# Patient Record
Sex: Male | Born: 1960
Health system: Southern US, Community
[De-identification: ages and names within clinical notes are randomized; demographics above are authoritative.]

## PROBLEM LIST (undated history)

## (undated) DIAGNOSIS — E039 Hypothyroidism, unspecified: Secondary | ICD-10-CM

## (undated) DIAGNOSIS — J069 Acute upper respiratory infection, unspecified: Secondary | ICD-10-CM

## (undated) DIAGNOSIS — G4730 Sleep apnea, unspecified: Secondary | ICD-10-CM

## (undated) DIAGNOSIS — F419 Anxiety disorder, unspecified: Secondary | ICD-10-CM

## (undated) DIAGNOSIS — I10 Essential (primary) hypertension: Secondary | ICD-10-CM

## (undated) DIAGNOSIS — L309 Dermatitis, unspecified: Secondary | ICD-10-CM

## (undated) HISTORY — PX: TONSILLECTOMY: SHX5217

## (undated) HISTORY — DX: Dermatitis, unspecified: L30.9

## (undated) HISTORY — DX: Essential (primary) hypertension: I10

## (undated) HISTORY — PX: CHOLECYSTECTOMY: SHX55

## (undated) HISTORY — PX: WISDOM TOOTH EXTRACTION: SHX21

## (undated) HISTORY — PX: OSTEOCHONDROMA EXCISION: SHX2137

## (undated) HISTORY — DX: Acute upper respiratory infection, unspecified: J06.9

---

## 2000-08-08 ENCOUNTER — Encounter: Payer: Self-pay | Admitting: Internal Medicine

## 2000-08-08 ENCOUNTER — Encounter: Admission: RE | Admit: 2000-08-08 | Discharge: 2000-08-08 | Payer: Self-pay | Admitting: Internal Medicine

## 2002-07-16 ENCOUNTER — Encounter: Payer: Self-pay | Admitting: Emergency Medicine

## 2002-07-16 ENCOUNTER — Emergency Department (HOSPITAL_COMMUNITY): Admission: EM | Admit: 2002-07-16 | Discharge: 2002-07-16 | Payer: Self-pay | Admitting: Emergency Medicine

## 2002-09-26 ENCOUNTER — Emergency Department (HOSPITAL_COMMUNITY): Admission: EM | Admit: 2002-09-26 | Discharge: 2002-09-26 | Payer: Self-pay | Admitting: Emergency Medicine

## 2002-09-26 ENCOUNTER — Encounter: Payer: Self-pay | Admitting: Emergency Medicine

## 2002-09-30 ENCOUNTER — Encounter: Payer: Self-pay | Admitting: Internal Medicine

## 2002-09-30 ENCOUNTER — Inpatient Hospital Stay (HOSPITAL_COMMUNITY): Admission: AD | Admit: 2002-09-30 | Discharge: 2002-10-02 | Payer: Self-pay | Admitting: Internal Medicine

## 2002-10-01 ENCOUNTER — Encounter: Payer: Self-pay | Admitting: Cardiology

## 2002-10-02 ENCOUNTER — Encounter (INDEPENDENT_AMBULATORY_CARE_PROVIDER_SITE_OTHER): Payer: Self-pay | Admitting: *Deleted

## 2002-10-02 ENCOUNTER — Encounter: Payer: Self-pay | Admitting: Internal Medicine

## 2003-03-17 ENCOUNTER — Ambulatory Visit (HOSPITAL_COMMUNITY): Admission: RE | Admit: 2003-03-17 | Discharge: 2003-03-17 | Payer: Self-pay | Admitting: Gastroenterology

## 2003-03-17 ENCOUNTER — Encounter (INDEPENDENT_AMBULATORY_CARE_PROVIDER_SITE_OTHER): Payer: Self-pay | Admitting: *Deleted

## 2003-03-20 ENCOUNTER — Encounter: Payer: Self-pay | Admitting: Gastroenterology

## 2003-03-20 ENCOUNTER — Encounter: Admission: RE | Admit: 2003-03-20 | Discharge: 2003-03-20 | Payer: Self-pay | Admitting: Gastroenterology

## 2003-03-26 ENCOUNTER — Emergency Department (HOSPITAL_COMMUNITY): Admission: EM | Admit: 2003-03-26 | Discharge: 2003-03-26 | Payer: Self-pay | Admitting: Emergency Medicine

## 2003-04-15 ENCOUNTER — Encounter: Payer: Self-pay | Admitting: Surgery

## 2003-04-15 ENCOUNTER — Encounter (INDEPENDENT_AMBULATORY_CARE_PROVIDER_SITE_OTHER): Payer: Self-pay | Admitting: *Deleted

## 2003-04-15 ENCOUNTER — Ambulatory Visit (HOSPITAL_COMMUNITY): Admission: RE | Admit: 2003-04-15 | Discharge: 2003-04-15 | Payer: Self-pay | Admitting: Surgery

## 2004-10-28 DIAGNOSIS — D229 Melanocytic nevi, unspecified: Secondary | ICD-10-CM

## 2004-10-28 HISTORY — DX: Melanocytic nevi, unspecified: D22.9

## 2005-01-04 ENCOUNTER — Ambulatory Visit: Payer: Self-pay | Admitting: Internal Medicine

## 2005-05-19 ENCOUNTER — Ambulatory Visit: Payer: Self-pay | Admitting: Internal Medicine

## 2005-06-02 ENCOUNTER — Ambulatory Visit: Payer: Self-pay | Admitting: Internal Medicine

## 2005-10-13 ENCOUNTER — Ambulatory Visit: Payer: Self-pay | Admitting: Internal Medicine

## 2006-03-29 ENCOUNTER — Ambulatory Visit: Payer: Self-pay | Admitting: Internal Medicine

## 2006-04-25 ENCOUNTER — Ambulatory Visit: Payer: Self-pay | Admitting: Internal Medicine

## 2006-05-19 ENCOUNTER — Ambulatory Visit: Payer: Self-pay | Admitting: Internal Medicine

## 2006-08-23 ENCOUNTER — Ambulatory Visit: Payer: Self-pay | Admitting: Internal Medicine

## 2007-01-09 ENCOUNTER — Encounter (INDEPENDENT_AMBULATORY_CARE_PROVIDER_SITE_OTHER): Payer: Self-pay | Admitting: *Deleted

## 2007-01-09 ENCOUNTER — Ambulatory Visit (HOSPITAL_BASED_OUTPATIENT_CLINIC_OR_DEPARTMENT_OTHER): Admission: RE | Admit: 2007-01-09 | Discharge: 2007-01-09 | Payer: Self-pay | Admitting: General Surgery

## 2007-01-11 ENCOUNTER — Ambulatory Visit: Payer: Self-pay | Admitting: Internal Medicine

## 2007-02-15 ENCOUNTER — Encounter (INDEPENDENT_AMBULATORY_CARE_PROVIDER_SITE_OTHER): Payer: Self-pay | Admitting: Specialist

## 2007-02-15 ENCOUNTER — Ambulatory Visit (HOSPITAL_BASED_OUTPATIENT_CLINIC_OR_DEPARTMENT_OTHER): Admission: RE | Admit: 2007-02-15 | Discharge: 2007-02-15 | Payer: Self-pay | Admitting: General Surgery

## 2007-04-02 ENCOUNTER — Ambulatory Visit: Payer: Self-pay | Admitting: Internal Medicine

## 2007-06-14 ENCOUNTER — Ambulatory Visit: Payer: Self-pay | Admitting: Internal Medicine

## 2007-11-15 ENCOUNTER — Ambulatory Visit: Payer: Self-pay | Admitting: Internal Medicine

## 2008-03-28 DIAGNOSIS — J302 Other seasonal allergic rhinitis: Secondary | ICD-10-CM

## 2008-03-28 DIAGNOSIS — J4520 Mild intermittent asthma, uncomplicated: Secondary | ICD-10-CM | POA: Insufficient documentation

## 2008-03-28 DIAGNOSIS — J3089 Other allergic rhinitis: Secondary | ICD-10-CM

## 2008-03-31 ENCOUNTER — Ambulatory Visit: Payer: Self-pay | Admitting: Internal Medicine

## 2008-04-03 ENCOUNTER — Ambulatory Visit: Payer: Self-pay | Admitting: Internal Medicine

## 2008-04-05 DIAGNOSIS — K219 Gastro-esophageal reflux disease without esophagitis: Secondary | ICD-10-CM | POA: Insufficient documentation

## 2008-04-09 ENCOUNTER — Telehealth (INDEPENDENT_AMBULATORY_CARE_PROVIDER_SITE_OTHER): Payer: Self-pay | Admitting: *Deleted

## 2008-08-07 ENCOUNTER — Ambulatory Visit: Payer: Self-pay | Admitting: Internal Medicine

## 2009-01-14 ENCOUNTER — Ambulatory Visit: Payer: Self-pay | Admitting: Internal Medicine

## 2009-03-31 ENCOUNTER — Ambulatory Visit: Payer: Self-pay | Admitting: Internal Medicine

## 2009-06-17 ENCOUNTER — Ambulatory Visit: Payer: Self-pay | Admitting: Internal Medicine

## 2009-11-11 ENCOUNTER — Ambulatory Visit: Payer: Self-pay | Admitting: Internal Medicine

## 2010-04-14 ENCOUNTER — Ambulatory Visit: Payer: Self-pay | Admitting: Internal Medicine

## 2010-04-16 ENCOUNTER — Ambulatory Visit: Payer: Self-pay | Admitting: Internal Medicine

## 2010-08-11 ENCOUNTER — Ambulatory Visit: Payer: Self-pay | Admitting: Internal Medicine

## 2010-11-15 ENCOUNTER — Ambulatory Visit: Payer: Self-pay | Admitting: Internal Medicine

## 2010-12-07 ENCOUNTER — Ambulatory Visit: Payer: Self-pay | Admitting: Internal Medicine

## 2011-01-13 NOTE — Assessment & Plan Note (Signed)
Summary: Acute NP office visit - sinus infection   Primary Provider/Referring Provider:  MJ Baxley  CC:  sinus pressure/congestion, bilat ear pressure, left ear discomfort, clear to slight yellow mucus, and x3days - denies f/c/s.  History of Present Illness: 03/31/08- Ricky Sharp returns for follow-up.  He is having some Spring allergy problems, but mild , and much better than before he started his allergy shots.  He is now on allergy vaccine at 1:10 giving his own without problems.  He does have an EpiPen and has discussed risk and benefit issues with me .  He describes some sneezing, and some crusting around the eyes.  There is a  maxillary sinus pressure.  He does not want to add additional medicines if possible.  He is aware of esophageal reflux, and we discussed this.  He has not been wheezing and there has been nothing purulent.  03/31/09- Asthma, allergic rhinitis, GERD Got trhough winter without significant respiratory infection. This spring pollen allergy season isn't bothering him badly. Aware of some sneeze and watery eyes, controlled with Astepro. No problems with his allergy vaccine. We discussed his meds.  Apr 14, 2010- Asthma, allergic rhinitis, GERD This is usually a problem allergy season, but he has done better the last 2 years on vaccine. Some watery eyes and crusting. No wheeze in years. Being treated for rosacea. Continues allergy vacine giving his own without problems- needs epipen.  11/15/10--Presents for an acute office visit. Complains of sinus pressure/congestion, bilat ear pressure, left ear discomfort, clear to slight yellow mucus, x3days. Has not used any otc meds.  Denies chest pain, dyspnea, orthopnea, hemoptysis, fever, n/v/d, edema, headache.    Medications Prior to Update: 1)  Allergy Vaccine 1:10 Go .... As Directed 2)  Astepro 137 Mcg/spray  Soln (Azelastine Hcl) .Marland Kitchen.. 1-2 Sprays in Each Nostril Two Times A Day As Needed 3)  Veramyst 27.5 Mcg/spray  Susp  (Fluticasone Furoate) .Marland Kitchen.. 1 Puff in Am Daily 4)  Kapidex 60 Mg Cpdr (Dexlansoprazole) .... Take 1 By Mouth Once Daily 5)  Epipen 0.3 Mg/0.27ml (1:1000)  Devi (Epinephrine Hcl (Anaphylaxis)) .... As Needed 6)  Patanol 0.1 %  Soln (Olopatadine Hcl) .... As Needed 7)  Orencia 250 Mg Solr (Abatacept) .... As Needed Roseca Treatment  Current Medications (verified): 1)  Allergy Vaccine 1:10 Go .... Once Weekly 2)  Astepro 137 Mcg/spray  Soln (Azelastine Hcl) .Marland Kitchen.. 1-2 Sprays in Each Nostril Two Times A Day As Needed 3)  Veramyst 27.5 Mcg/spray  Susp (Fluticasone Furoate) .Marland Kitchen.. 1 Puff in Each Nostril Once Daily As Needed 4)  Dexilant 60 Mg Cpdr (Dexlansoprazole) .... Take 1 Capsule By Mouth Once A Day Before Meal As Needed 5)  Epipen 0.3 Mg/0.74ml (1:1000)  Devi (Epinephrine Hcl (Anaphylaxis)) .... As Needed 6)  Patanol 0.1 %  Soln (Olopatadine Hcl) .... As Needed 7)  Orencia 250 Mg Solr (Abatacept) .... As Needed Roseca Treatment  Allergies (verified): No Known Drug Allergies  Past History:  Past Medical History: Last updated: 04/14/2010 ASTHMA (ICD-493.90) ALLERGIC RHINITIS (ICD-477.9) G E R D  Past Surgical History: Last updated: 03/31/2009 Cholecystectomy Tonsillectomy Osteochondroma from right tibia  Family History: Last updated: 03/31/2009 Parents living 2009  Social History: Last updated: 03/31/2009 Patient never smoked.  Network Production designer, theatre/television/film Married  Risk Factors: Smoking Status: never (03/31/2009)  Review of Systems      See HPI  Vital Signs:  Patient profile:   50 year old male Height:      70.5 inches  Weight:      199.13 pounds BMI:     28.27 O2 Sat:      97 % on Room air Temp:     97.6 degrees F oral Pulse rate:   82 / minute BP sitting:   136 / 94  (left arm) Cuff size:   regular  Vitals Entered By: Ricky Master CNA/MA (November 15, 2010 4:34 PM)  O2 Flow:  Room air CC: sinus pressure/congestion, bilat ear pressure, left ear discomfort,  clear to slight yellow mucus, x3days - denies f/c/s Is Patient Diabetic? No Comments Medications reviewed with patient Daytime contact number verified with patient. Ricky Master CNA/MA  November 15, 2010 4:34 PM    Physical Exam  Additional Exam:  General: A/Ox3; pleasant and cooperative, NAD, SKIN: no rash, lesions NODES: no lymphadenopathy HEENT: Ricky Sharp, EOM- WNL, Conjuctivae- clear, PERRLA, TM-WNL, Nose- clear, Throat- clear and wnl, Mallampati  III NECK: Supple w/ fair ROM, JVD- none, normal carotid impulses w/o bruits Thyroid- CHEST: Clear to P&A HEART: RRR, no m/g/r heard ABDOMEN: TFT:DDUK, nl pulses, no edema  NEURO: Grossly intact to observation      Impression & Recommendations:  Problem # 1:  ALLERGIC RHINITIS (ICD-477.9)  URI  Plan:  Mucinex DM two times a day as needed cough/congestion.  Zyrtec 10mg  at bedtime as needed drainage.  Saline nasal rinses as needed  Restart veramyst and astepro Zpack to have on hold if symptoms worsen or do not improve with discolored mucus.  Please contact office for sooner follow up if symptoms do not improve or worsen  His updated medication list for this problem includes:    Astepro 137 Mcg/spray Soln (Azelastine hcl) .Marland Kitchen... 1-2 sprays in each nostril two times a day as needed    Veramyst 27.5 Mcg/spray Susp (Fluticasone furoate) .Marland Kitchen... 1 puff in each nostril once daily as needed  Orders: Est. Patient Level III (02542)  Medications Added to Medication List This Visit: 1)  Allergy Vaccine 1:10 Go  .... Once weekly 2)  Astepro 137 Mcg/spray Soln (Azelastine hcl) .Marland Kitchen.. 1-2 sprays in each nostril two times a day as needed 3)  Veramyst 27.5 Mcg/spray Susp (Fluticasone furoate) .Marland Kitchen.. 1 puff in each nostril once daily as needed 4)  Dexilant 60 Mg Cpdr (Dexlansoprazole) .... Take 1 capsule by mouth once a day before meal as needed 5)  Zithromax Z-pak 250 Mg Tabs (Azithromycin) .... Take as directed.  Complete Medication  List: 1)  Allergy Vaccine 1:10 Go  .... Once weekly 2)  Astepro 137 Mcg/spray Soln (Azelastine hcl) .Marland Kitchen.. 1-2 sprays in each nostril two times a day as needed 3)  Veramyst 27.5 Mcg/spray Susp (Fluticasone furoate) .Marland Kitchen.. 1 puff in each nostril once daily as needed 4)  Dexilant 60 Mg Cpdr (Dexlansoprazole) .... Take 1 capsule by mouth once a day before meal as needed 5)  Epipen 0.3 Mg/0.21ml (1:1000) Devi (Epinephrine hcl (anaphylaxis)) .... As needed 6)  Patanol 0.1 % Soln (Olopatadine hcl) .... As needed 7)  Orencia 250 Mg Solr (Abatacept) .... As needed roseca treatment 8)  Zithromax Z-pak 250 Mg Tabs (Azithromycin) .... Take as directed.  Patient Instructions: 1)  Mucinex DM two times a day as needed cough/congestion.  2)  Zyrtec 10mg  at bedtime as needed drainage.  3)  Saline nasal rinses as needed  4)  Restart veramyst and astepro 5)  Zpack to have on hold if symptoms worsen or do not improve with discolored mucus.  6)  Please contact  office for sooner follow up if symptoms do not improve or worsen  Prescriptions: ZITHROMAX Z-PAK 250 MG TABS (AZITHROMYCIN) take as directed.  #1 x 0   Entered and Authorized by:   Rubye Oaks NP   Signed by:   Tammy Parrett NP on 11/15/2010   Method used:   Print then Give to Patient   RxID:   [id number]    Immunization History:  Influenza Immunization History:    Influenza:  historical (09/11/2010)

## 2011-01-13 NOTE — Assessment & Plan Note (Signed)
Summary: 12 months/apc   Primary Provider/Referring Provider:  Delbert Harness   History of Present Illness: 03/31/08- Ricky Sharp returns for follow-up.  He is having some Spring allergy problems, but mild , and much better than before he started his allergy shots.  He is now on allergy vaccine at 1:10 giving his own without problems.  He does have an EpiPen and has discussed risk and benefit issues with me .  He describes some sneezing, and some crusting around the eyes.  There is a  maxillary sinus pressure.  He does not want to add additional medicines if possible.  He is aware of esophageal reflux, and we discussed this.  He has not been wheezing and there has been nothing purulent.  03/31/09- Asthma, allergic rhinitis, GERD Got trhough winter without significant respiratory infection. This spring pollen allergy season isn't bothering him badly. Aware of some sneeze and watery eyes, controlled with Astepro. No problems with his allergy vaccine. We discussed his meds.  Apr 14, 2010- Asthma, allergic rhinitis, GERD This is usually a problem allergy season, but he has done better the last 2 years on vaccine. Some watery eyes and crusting. No wheeze in years. Being treated for rosacea. Continues allergy vacine giving his own without problems- needs epipen.    Current Medications (verified): 1)  Allergy Vaccine 1:10 Go .... As Directed 2)  Astepro 137 Mcg/spray  Soln (Azelastine Hcl) .Marland Kitchen.. 1-2 Sprays in Each Nostril Two Times A Day As Needed 3)  Veramyst 27.5 Mcg/spray  Susp (Fluticasone Furoate) .Marland Kitchen.. 1 Puff in Am Daily 4)  Kapidex 60 Mg Cpdr (Dexlansoprazole) .... Take 1 By Mouth Once Daily 5)  Epipen 0.3 Mg/0.25ml (1:1000)  Devi (Epinephrine Hcl (Anaphylaxis)) .... As Needed 6)  Patanol 0.1 %  Soln (Olopatadine Hcl) .... As Needed 7)  Orencia 250 Mg Solr (Abatacept) .... As Needed Roseca Treatment  Allergies (verified): No Known Drug Allergies  Past History:  Past Surgical History: Last  updated: 03/31/2009 Cholecystectomy Tonsillectomy Osteochondroma from right tibia  Family History: Last updated: 03/31/2009 Parents living 2009  Social History: Last updated: 03/31/2009 Patient never smoked.  Network Production designer, theatre/television/film Married  Risk Factors: Smoking Status: never (03/31/2009)  Past Medical History: ASTHMA (ICD-493.90) ALLERGIC RHINITIS (ICD-477.9) G E R D  Review of Systems      See HPI  The patient denies anorexia, fever, weight loss, weight gain, vision loss, decreased hearing, hoarseness, chest pain, syncope, dyspnea on exertion, peripheral edema, prolonged cough, headaches, hemoptysis, and severe indigestion/heartburn.    Vital Signs:  Patient profile:   50 year old male Height:      70.5 inches Weight:      199 pounds BMI:     28.25 O2 Sat:      97 % on Room air Pulse rate:   66 / minute BP sitting:   120 / 82  (left arm) Cuff size:   regular  Vitals Entered By: Reynaldo Minium CMA (Apr 14, 2010 10:57 AM)  O2 Flow:  Room air  Physical Exam  Additional Exam:  General: A/Ox3; pleasant and cooperative, NAD, SKIN: no rash, lesions NODES: no lymphadenopathy HEENT: [place]/AT, EOM- WNL, Conjuctivae- clear, PERRLA, TM-WNL, Nose- clear, Throat- clear and wnl, Mallampati  III NECK: Supple w/ fair ROM, JVD- none, normal carotid impulses w/o bruits Thyroid- CHEST: Clear to P&A HEART: RRR, no m/g/r heard ABDOMEN: EAV:WUJW, nl pulses, no edema  NEURO: Grossly intact to observation      Impression & Recommendations:  Problem #  1:  ASTHMA (ICD-493.90) This has been inapparent in recent years.  Problem # 2:  ALLERGIC RHINITIS (ICD-477.9)  med refills. Will continue allergy vaccine and renew the epipen. His updated medication list for this problem includes:    Astepro 137 Mcg/spray Soln (Azelastine hcl) .Marland Kitchen... 1-2 sprays in each nostril two times a day as needed    Veramyst 27.5 Mcg/spray Susp (Fluticasone furoate) .Marland Kitchen... 1 puff in am daily  Medications  Added to Medication List This Visit: 1)  Orencia 250 Mg Solr (Abatacept) .... As needed roseca treatment  Other Orders: Est. Patient Level III (16109)  Patient Instructions: 1)  Please schedule a follow-up appointment in 1 year. 2)  Script refills printed 3)  Continue  allergy vaccine Prescriptions: PATANOL 0.1 %  SOLN (OLOPATADINE HCL) as needed  #1 x prn   Entered and Authorized by:   Waymon Budge MD   Signed by:   Waymon Budge MD on 04/14/2010   Method used:   Print then Give to Patient   RxID:   [id number] EPIPEN 0.3 MG/0.3ML (1:1000)  DEVI (EPINEPHRINE HCL (ANAPHYLAXIS)) as needed  #1 x prn   Entered and Authorized by:   Waymon Budge MD   Signed by:   Waymon Budge MD on 04/14/2010   Method used:   Print then Give to Patient   RxID:   [id number] VERAMYST 27.5 MCG/SPRAY  SUSP (FLUTICASONE FUROATE) 1 puff in am daily  #1 x prn   Entered and Authorized by:   Waymon Budge MD   Signed by:   Waymon Budge MD on 04/14/2010   Method used:   Print then Give to Patient   RxID:   [id number] ASTEPRO 137 MCG/SPRAY  SOLN (AZELASTINE HCL) 1-2 sprays in each nostril two times a day as needed  #1 x prn   Entered and Authorized by:   Waymon Budge MD   Signed by:   Waymon Budge MD on 04/14/2010   Method used:   Print then Give to Patient   RxID:   [id number]

## 2011-01-20 ENCOUNTER — Other Ambulatory Visit: Payer: Self-pay | Admitting: Internal Medicine

## 2011-01-24 ENCOUNTER — Encounter (INDEPENDENT_AMBULATORY_CARE_PROVIDER_SITE_OTHER): Payer: PRIVATE HEALTH INSURANCE | Admitting: Internal Medicine

## 2011-01-24 DIAGNOSIS — Z23 Encounter for immunization: Secondary | ICD-10-CM

## 2011-01-24 DIAGNOSIS — Z0000 Encounter for general adult medical examination without abnormal findings: Secondary | ICD-10-CM

## 2011-04-12 ENCOUNTER — Encounter: Payer: Self-pay | Admitting: Internal Medicine

## 2011-04-14 ENCOUNTER — Encounter: Payer: Self-pay | Admitting: Internal Medicine

## 2011-04-14 ENCOUNTER — Ambulatory Visit (INDEPENDENT_AMBULATORY_CARE_PROVIDER_SITE_OTHER): Payer: PRIVATE HEALTH INSURANCE | Admitting: Internal Medicine

## 2011-04-14 VITALS — BP 138/86 | HR 73 | Ht 70.5 in | Wt 199.0 lb

## 2011-04-14 DIAGNOSIS — J45909 Unspecified asthma, uncomplicated: Secondary | ICD-10-CM

## 2011-04-14 DIAGNOSIS — J309 Allergic rhinitis, unspecified: Secondary | ICD-10-CM

## 2011-04-14 MED ORDER — EPINEPHRINE 0.15 MG/0.3ML IJ DEVI: 0.1500 mg | INTRAMUSCULAR | Status: DC | PRN

## 2011-04-14 MED ORDER — OLOPATADINE HCL 0.1 % OP SOLN: 1.0000 [drp] | Freq: Two times a day (BID) | OPHTHALMIC | Status: DC

## 2011-04-14 MED ORDER — AZELASTINE HCL 0.1 % NA SOLN: 2.0000 | Freq: Two times a day (BID) | NASAL | Status: DC

## 2011-04-14 MED ORDER — FLUTICASONE FUROATE 27.5 MCG/SPRAY NA SUSP: 2.0000 | Freq: Every day | NASAL | Status: DC

## 2011-04-14 NOTE — Patient Instructions (Signed)
We are continuing present treatment.   Med scripts updated  You might look at otc nonsteroidal anti-inflammatory medicine Nasalcrom/ cromolyn. Directions are on box.

## 2011-04-14 NOTE — Assessment & Plan Note (Signed)
We will continue allergy vaccine and antihistamines, avoiding steroids as he requests. He could try otc cromolyn.

## 2011-04-14 NOTE — Progress Notes (Signed)
  Subjective:    Patient ID: Ricky Sharp, male    DOB: 1961/06/30, 50 y.o.   MRN: [id number]  HPI 04/14/11-50 yoM never smoker followecd for asthma and allergic rhinitis, complicated by GERD.  Last here 11/25/10 for acute visit. Reports no particular problems since then except this year Spring pollen season has been worse. Eyes burn and water, nasal congestion, choking sensation. Denies chest tightness or wheeze. Mucus is yellow green with no fever or obvious infection. Has used saline rinse and antihistamines every few days. . Not using the nasal steroid or Astepro. Says with hx of reflux, he feels better if he minimizes meds, especially inhaled steroids.  Continues allergy vaccine at 1:10 GO.    Review of Systems  see HPI Constitutional:   No weight loss, night sweats,  Fevers, chills, fatigue, lassitude. HEENT:   No headaches,  Difficulty swallowing,  Tooth/dental problems,  Sore throat,              CV:  No chest pain,  Orthopnea, PND, swelling in lower extremities, anasarca, dizziness, palpitations  GI  No heartburn, indigestion, abdominal pain, nausea, vomiting, diarrhea, change in bowel habits, loss of appetite  Resp: No shortness of breath with exertion or at rest.  No excess mucus, no productive cough,  No non-productive cough,  No coughing up of blood. .  No wheezing.    Skin: no rash or lesions.  GU: no dysuria, change in color of urine, no urgency or frequency.  No flank pain.  MS:  No joint pain or swelling.  No decreased range of motion.  No back pain.  Psych:  No change in mood or affect. No depression or anxiety.  No memory loss.   Objective:   Physical Exam General- Alert, Oriented, Affect-appropriate, Distress- none acute  Skin- rash-none, lesions- none, excoriation- none  Lymphadenopathy- none  Head- atraumatic  Eyes- Gross vision intact, PERRLA, conjunctivae clear secretions  Ears- Normal- Hearing, canals, Tm  Nose- Clear,  No- Septal dev, mucus,  polyps, erosion, perforation   Throat- Mallampati II , mucosa clear , drainage- none, tonsils- atrophic  Neck- flexible , trachea midline, no stridor , thyroid nl, carotid no bruit  Chest - symmetrical excursion , unlabored     Heart/CV- RRR , no murmur , no gallop  , no rub, nl s1 s2                     - JVD- none , edema- none, stasis changes- none, varices- none     Lung- clear to P&A, wheeze- none, cough- none , dullness-none, rub- none     Chest wall-  Abd- tender-no, distended-no, bowel sounds-present, HSM- no  Br/ Gen/ Rectal- Not done, not indicated  Extrem- cyanosis- none, clubbing, none, atrophy- none, strength- nl  Neuro- grossly intact to observation         Assessment & Plan:

## 2011-04-24 ENCOUNTER — Encounter: Payer: Self-pay | Admitting: Internal Medicine

## 2011-04-24 NOTE — Assessment & Plan Note (Signed)
Adequate control. 

## 2011-04-29 NOTE — Op Note (Signed)
Ricky Sharp, Ricky Sharp                        ACCOUNT NO.:  [id number]   MEDICAL RECORD NO.:  [id number]                   PATIENT TYPE:  INP   LOCATION:  4711                                 FACILITY:  [facility]   PHYSICIAN:  Anselmo Rod, M.D.               DATE OF BIRTH:  December 03, 1961   DATE OF PROCEDURE:  10/02/2002  DATE OF DISCHARGE:  10/02/2002                                 OPERATIVE REPORT   PROCEDURE PERFORMED:  Esophagogastroduodenoscopy with biopsy.   ENDOSCOPIST:  Charna Elizabeth, M.D.   INSTRUMENT USED:  Olympus video panendoscope.   INDICATIONS FOR PROCEDURE:  The patient is a 50 year old white male with a  history of chest pain and epigastric pain.  MI has been ruled out by  enzymes.  Rule out ulcer disease.   PREPROCEDURE PREPARATION:  Informed consent was procured from the patient.  The patient was fasted for eight hours prior to the procedure.   PREPROCEDURE PHYSICAL:  The patient had stable vital signs.  Neck supple,  chest clear to auscultation.  S1, S2 regular.  Abdomen soft with normal  bowel sounds.   DESCRIPTION OF PROCEDURE:  The patient was placed in the left lateral  decubitus position and sedated with 50 mg of Demerol and 5 mg of Versed  intravenously.  Once the patient was adequately sedated and maintained on  low-flow oxygen and continuous cardiac monitoring, the Olympus video  panendoscope was advanced through the mouth piece over the tongue into the  esophagus under direct vision.  The entire esophagus appeared normal with no  evidence of ring, stricture, masses, esophagitis or Barrett's mucosa.  The  scope was then advanced to the stomach.  There was severe gastritis  throughout the gastric mucosa, multiple small punched out ulcers seen in the  stomach.  There was old heme overlying the small ulcers.  It was predominant  in the proximal portion of the stomach and in the midbody.  Multiple  erosions were seen in the antrum.  There was moderate  duodenitis in the  duodenal bulb.  The small bowel distal to the bulb appeared normal.  Multiple biopsies were done to rule out presence of Helicobacter pylori by  pathology.   IMPRESSION:  1. Normal-appearing esophagus.  2. Multiple small gastric ulcers with old heme overlying them.  3. Severe gastritis with multiple erosions.  4. Moderate duodenitis in duodenal bulb.  5. Normal-appearing proximal small bowel and __________ up to 60 cm.   RECOMMENDATIONS:  1. Await pathology results.  2. Treat with high dose proton pump inhibitor as discussed with Dr. Eden Emms Baxley.  3.     Treat with antibiotics if Helicobacter pylori present on pathology.  4. Avoid all nonsteroidals for now.  5. Outpatient follow-up in the next seven to 10 days or earlier if need be.  Anselmo Rod, M.D.    JNM/MEDQ  D:  10/03/2002  T:  10/03/2002  Job:  [id number]   cc:   Luanna Cole. Lenord Fellers, MD  [address], [place]  [place]  [place] [address]  Fax: [phone]   Learta Codding, M.D. [facility]

## 2011-04-29 NOTE — Op Note (Signed)
NAMEBOB, DAVERSA NO.:  [id number]   MEDICAL RECORD NO.:  [id number]          PATIENT TYPE:  AMB   LOCATION:  DSC                          FACILITY:  [facility]   PHYSICIAN:  Cherylynn Ridges, M.D.    DATE OF BIRTH:  1960/12/27   DATE OF PROCEDURE:  01/09/2007  DATE OF DISCHARGE:                               OPERATIVE REPORT   PREOPERATIVE DIAGNOSIS:  Right upper back or posterior axillary lipoma.   POSTOPERATIVE DIAGNOSIS:  Right upper back or posterior axillary lipoma.   PROCEDURE:  Excision of right upper back posterior lipoma.   SURGEON:  Cherylynn Ridges, M.D.   ASSISTANT:  None.   ANESTHESIA:  Monitored anesthesia care with Xylocaine and Marcaine  local.   ESTIMATED BLOOD LOSS:  Less than 10 mL.   COMPLICATIONS:  None.   CONDITION:  Stable.   INDICATIONS FOR OPERATION:  The patient is a 50 year old with a  symptomatic bulging mass on the right posterior upper back, posterior  axillary and post scapula area, who now comes in for excision.   FINDINGS:  The patient had a 6 x 2 x 4 cm lipoma superficial to the  muscle fascia, which was excised.   OPERATION:  The patient was taken to the operating room and placed on  the table in the supine position.  After an adequate amount of IV  sedation, he was placed in the left lateral decubitus position with his  right shoulder up.   He was prepped and draped in the usual sterile manner.  We anesthetized  the area transversely using a 25-gauge needle and 1% Xylocaine with  bicarbonate.  Approximately a total of 20 mL were used.  We made an  incision using a #15 blade about 6 cm long and then dissected down  through the subcutaneous tissue into the subcu while we immediately ran  into smooth shell lipoma.  We dissected it out superiorly, inferiorly,  and medially and laterally, and then completely excised it using  electrocautery.  However, there appeared to still be a bulge in the  muscle after we excised it.   Therefore, we entered the muscle fascia and  explored thoroughly down to the chest wall for an additional possible  submuscular or subfascial lipoma, which was not found.  The bulging was  possibly secondary just to positioning of the patient on the table in  the left lateral decubitus position.  However, after extensive  exploration, no further lipomas or other masses were noted.  Again, we  went all the way down to the chest wall with no masses being seen,  splitting the fascia, but not cutting the muscle itself.   After we thoroughly explored, we irrigated with saline, obtained  hemostasis with electrocautery, and then we closed in 2 layers with 3-0  Vicryl subcutaneous layer and then a subcuticular stitch of 4-0  Monocryl.  Marcaine 0.25% with epinephrine was injected into the wound  prior to closure, a total of 10 mL were used.  A sterile dressing was  applied.  All needle counts, sponge counts, and instrument counts were  correct.      Cherylynn Ridges, M.D.  Electronically Signed     JOW/MEDQ  D:  01/09/2007  T:  01/09/2007  Job:  [id number]   cc:   Anselmo Rod, M.D.

## 2011-04-29 NOTE — Consult Note (Signed)
NAME:  Ricky Sharp, Ricky Sharp                        ACCOUNT NO.:  [id number]   MEDICAL RECORD NO.:  [id number]                   PATIENT TYPE:  EMS   LOCATION:  MAJO                                 FACILITY:  [facility]   PHYSICIAN:  Learta Codding, M.D. [facility]             DATE OF BIRTH:  01-16-1961   DATE OF CONSULTATION:  DATE OF DISCHARGE:  09/26/2002                                   CONSULTATION   CURRENT COMPLAINTS:  Epigastric pain and chest tightness.   HISTORY OF PRESENT ILLNESS:  The patient is a 50 year old white male with no  prior cardiac history. The patient was recently seen in the emergency room  on 09/26/2002 with complaints of chest tightness. Reportedly the patient's  enzymes were within normal limits. The patient's pain apparently occurred on  10/15 in the late evening and then early on in the morning of 10/16. 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 Dr. Cornelius Moras referred the patient to  911 and the emergency room. Subsequently the patient was discharged. He did  receive aspirin and nitroglycerin in the ambulance without any improvement  to the symptoms. At that time he also received a GI cocktail, which then  caused the pain to sit in the upper abdomen. On Friday he felt reasonably  good as stated, but did have some burning in the upper epigastrium  associated with burping. On Saturday he had a brief spell of upper  epigastric burning. On Sunday he felt 90%+ improved, but he still had some  gastric bloating. He denied any exertional chest pain or exertional  shortness of breath over the weekend. He was followed up by Dr. Lenord Fellers today  in the office and did report ongoing symptoms earlier today of upper  epigastric pain. This was not associated with nausea or vomiting.  Electrocardiogram in Dr. Beryle Quant office does not show evidence of ischemia.  The patient had been tried to take Prilosec over-the-counter. He had been  under significant stress at work lately as well as  at home. The patient  reports feeling fatigued and weak, but denies any palpitations or syncope.  On admission today the patient's electrocardiogram does not demonstrate  evidence of ischemia.   PAST MEDICAL HISTORY:  History of kidney stone in August. History of fatty  liver with elevated liver functions test. On low fat, low calorie diet.  History of osteochondroma of the tibia.   ALLERGIES:  No known drug allergies.   MEDICATIONS:  Advair and Nasacort.   SOCIAL HISTORY:  The patient is a Psychiatric nurse. He has three children.  Denies tobacco or alcohol history.   FAMILY HISTORY:  Noncontributory with no significant cardiac disease. The  patient does not know his biological father.   REVIEW OF SYMPTOMS:  Epigastric discomfort as outlined above. Frequent  burping and questionable borborygmi. Increased flatulence. No nausea or  vomiting. No fever or chills. No dysuria  or frequency.   PHYSICAL EXAMINATION:   VITAL SIGNS:  Blood pressure 130/85, heart rate 72 beats per minute,  temperature is afebrile at 98.   GENERAL:  Well-nourished white male in no apparent distress.   HEENT:  Conjunctivae clear.   NECK:  Supple. No carotid upstroke. No carotid bruit.   LUNGS:  Clear.   HEART:  Regular rate and rhythm. Normal S1, S2. No murmur, rub or gallop.   ABDOMEN:  Soft. Nontender. No rebound or guarding. Good bowel sounds. There  is mild tenderness in the left upper quadrant.   EXTREMITIES:  2+ peripheral pulses. There is no cyanosis, clubbing, or  edema.   NEUROLOGIC:  The patient is alert, oriented, and grossly nonfocal.   LABORATORY DATA:  EKG normal sinus rhythm, no acute ischemic change  otherwise. Unremarkable EKG. Chest x-ray within normal limits. Labs are  pending including a troponin, CMP, BMP.   IMPRESSION/ PLAN:  1. Atypical chest pain. The patient has few risk factors for a coronary     artery disease. His presentation is rather atypical for unstable angina.      I sincerely doubt his problem is cardiac at this point in time. However,     given the fact that this is the patient's second admission now within a     week we will proceed with an inpatient Cardiolite stress test if his     enzymes are within normal limits. Would obtain also a fasting lipid panel     for further risk assessment. If cardiac enzymes are positive certainly     cardiac catheterization could be entertained, but I doubt that this will     be the case.  2. Epigastric pain. The patient complains of significant GI complaints     including flatulence and frequent burping. He is under significant     stress. Peptic ulcer disease or other GI pathology is likely the cause of     the patient's symptoms.  3. Stress and anxiety.   DISPOSITION:  The patient will be admitted and ruled for MI. Will undergo an  exercise Cardiolite study in the morning.                                               Learta Codding, M.D. [facility]    GED/MEDQ  D:  09/30/2002  T:  09/30/2002  Job:  [id number]   cc:   Luanna Cole. Lenord Fellers, MD  [address] [place]  [place]  [place] [address]  Fax: [phone]

## 2011-04-29 NOTE — Op Note (Signed)
NAMEJAIRE, PINKHAM NO.:  [id number]   MEDICAL RECORD NO.:  [id number]          PATIENT TYPE:  AMB   LOCATION:  DSC                          FACILITY:  [facility]   PHYSICIAN:  Cherylynn Ridges, M.D.    DATE OF BIRTH:  16-Nov-1961   DATE OF PROCEDURE:  02/15/2007  DATE OF DISCHARGE:                               OPERATIVE REPORT   PREOPERATIVE DIAGNOSIS:  Lipoma of the right upper back.   POSTOPERATIVE DIAGNOSIS:  Lipoma of the right upper back.   PROCEDURE:  Excision of right upper back lipoma.   SURGEON:  Cherylynn Ridges, M.D.   ANESTHESIA:  General endotracheal, was done in the prone position.   COMPLICATIONS:  None.   CONDITION:  Stable.   INDICATION FOR OPERATION:  The patient is a 50 year old who had a prior  lipoma of the right upper back excised, now comes back in for second  lipoma to be removed from the back just below the area of the previous  excision.   FINDINGS:  The patient had a well circumscribed lipoma in the  suprafascial area of the right upper back measuring approximately 4 x  3.5 cm in size.   OPERATION:  The patient was taken to the operating room, placed  initially on the table in the supine position.  After adequate  endotracheal anesthetic was administered, he was flipped and placed on  the operative table in the prone position.   The right upper back was prepped and draped in the usual sterile manner.  We marked the area of the lipoma preoperatively and then  intraoperatively.  We made a transverse sort of a diagonal incision  across the mass and dissected down into the subcutaneous tissue using  electrocautery and also Metzenbaum scissors.  The actual lipoma was well  circumscribed.  We were able to dissect down easily from the surrounding  structures, obtaining hemostasis with electrocautery.  We excised  through the mass completely and placed it off the field to be sent to  pathology.  We irrigated with saline solution and  obtained hemostasis  using electrocautery.  The wound was then closed in 2 layers,  a 4-0 Vicryl in the subcutaneous layer and a running subcuticular stitch  of 4-0 Monocryl.  The 0.5% Marcaine without epinephrine was injected  into the wound, approximately 8 mL were used.  A sterile dressing was  applied.  All counts were correct.      Cherylynn Ridges, M.D.  Electronically Signed     JOW/MEDQ  D:  02/15/2007  T:  02/15/2007  Job:  [id number]   cc:   Luanna Cole. Lenord Fellers, M.D.

## 2011-04-29 NOTE — Op Note (Signed)
NAME:  Ricky Sharp, Ricky Sharp                        ACCOUNT NO.:  [id number]   MEDICAL RECORD NO.:  [id number]                   PATIENT TYPE:  AMB   LOCATION:  ENDO                                 FACILITY:  [facility]   PHYSICIAN:  Anselmo Rod, M.D.               DATE OF BIRTH:  [birth year]   DATE OF PROCEDURE:  03/17/2003  DATE OF DISCHARGE:                                 OPERATIVE REPORT   PROCEDURE:  Esophagogastroduodenoscopy with biopsies.   ENDOSCOPIST:  Anselmo Rod, M.D.   INSTRUMENT USED:  Olympus video panendoscope.   INDICATION FOR PROCEDURE:  A 50 year old white undergoing a repeat EGD for  epigastric pain.  The patient has had a history of H. pylori infection in  the past.  Rule out recurrent ulcer disease.   PREPROCEDURE PREPARATION:  Informed consent was procured from the patient.  The patient was fasted for eight hours prior to the procedure.   PREPROCEDURE PHYSICAL:  VITAL SIGNS:  The patient had stable vital signs.  NECK:  Supple.  CHEST:  Clear to auscultation.  S1, S2 regular.  ABDOMEN:  Soft with normal bowel sounds.   DESCRIPTION OF PROCEDURE:  The patient was placed in the left lateral  decubitus position and sedated with 70 mg of Demerol and 7 mg of Versed  intravenously.  Once the patient was adequately sedate and maintained on low-  flow oxygen and continuous cardiac monitoring, the Olympus video  panendoscope was advanced through the mouthpiece, over the tongue, into the  esophagus under direct vision.  The entire esophagus appeared normal with no  evidence of ring, stricture, masses, esophagitis, or Barrett's mucosa.  The  scope was then advanced into the stomach.  The gastric mucosa seemed to be  significantly improved compared to the last endoscopy the patient had when  he was found to have multiple ulcers in the stomach.  There was mild diffuse  gastritis.  Biopsies were done from the antrum to rule out the presence of  H. pylori by pathology,  but no ulcers, erosions, masses, or polyps were  seen.  Retroflexion in the high cardia revealed no abnormalities.  The  duodenal bulb and the small bowel distal to the bulb up to 60 cm appeared  normal.  There was no outlet obstruction.  The patient tolerated the  procedure well without complications.   IMPRESSION:  1. Normal-appearing esophagus and proximal small bowel.  2. Mild diffuse gastritis, biopsies done for Helicobacter pylori.   RECOMMENDATIONS:  1. Await pathology results.  2. Continue PPIs for now.  3. Treat with antibiotics if H. pylori present.  4.     A high-fiber diet with liberal fluid intake.  5. Add Beano to the diet as needed.  6. Outpatient follow-up in the next two weeks for further recommendations.  Anselmo Rod, M.D.    JNM/MEDQ  D:  03/18/2003  T:  03/18/2003  Job:  [id number]   cc:   Luanna Cole. Lenord Fellers, M.D.  [address] [place]  [place]  [place] [address]  Fax: [phone]

## 2011-04-29 NOTE — Assessment & Plan Note (Signed)
[facility]                             PULMONARY OFFICE NOTE   ANDER, WAMSER                     MRN:          [id number]  DATE:04/02/2007                            DOB:          Dec 03, 1961    PULMONARY FOLLOW-UP:   PROBLEM LIST:  1. Asthma.  2. Allergic rhinitis.  3. Allergic conjunctivitis.   HISTORY:  One-year follow-up.  He feels he has done quite well.  He  continues allergy vaccine at 1:10 giving his own injection with no  problems.  We reviewed risk and safety issues and refilled his EpiPen.  He has not needed Foradil or any acute bronchodilator.  He says whenever  he uses his peak flow meter, he blows off the top.   MEDICATIONS:  Allergy vaccine, Astelin, fluticasone nasal spray,  Zegerid, p.r.n. use of EpiPen, and Patanol.   No medication allergy.   OBJECTIVE:  Weight 205 pounds, BP 126/84, pulse 77, room air saturation  98%.  Eyes, nose, throat and chest were all clear at this time.  Heart  sounds regular and normal.   IMPRESSION:  Good control of asthma, allergic rhinitis and allergic  conjunctivitis.   PLAN:  1. Continue vaccine at 1:10.  2. EpiPen refilled.  We also refilled Patanol, fluticasone and Astelin      for use as discussed.  He does not feel he needs Foradil, so we are      going to watch off of bronchodilators completely after some      discussion.  3. Schedule return one year, earlier p.r.n.     Clinton D. Maple Hudson, MD, Tonny Bollman, FACP  Electronically Signed    CDY/MedQ  DD: 04/02/2007  DT: 04/03/2007  Job #: [id number]   cc:   Luanna Cole. Lenord Fellers, M.D.

## 2011-04-29 NOTE — Op Note (Signed)
NAMEJARQUIS, Ricky Sharp                        ACCOUNT NO.:  [id number]   MEDICAL RECORD NO.:  [id number]                   PATIENT TYPE:  OIB   LOCATION:  2550                                 FACILITY:  [facility]   PHYSICIAN:  Abigail Miyamoto, M.D.              DATE OF BIRTH:  Apr 06, 1961   DATE OF PROCEDURE:  04/15/2003  DATE OF DISCHARGE:                                 OPERATIVE REPORT   PREOPERATIVE DIAGNOSIS:  Biliary dyskinesia.   POSTOPERATIVE DIAGNOSIS:  Biliary dyskinesia.   PROCEDURE:  Laparoscopic cholecystectomy with intraoperative cholangiogram.   SURGEON:  Abigail Miyamoto, M.D.   ASSISTANT:  Magnus Ivan, R.N.F.A.   ANESTHESIA:  General endotracheal anesthesia.   ESTIMATED BLOOD LOSS:  Minimal.   FINDINGS:  The patient was found to have a normal cholangiogram.   DESCRIPTION OF PROCEDURE:  The patient was brought to the operating room and  identified positively.  He was placed supine on the operating room table and  general anesthesia was induced.  His abdomen was then prepped and draped in  the usual sterile fashion.  Using a #15 blade, a small transverse incision  was made below the umbilicus.  The incision was carried down to the fascia,  which was then opened with a scalpel.  A hemostat was then used to pass into  the peritoneal cavity.  Next a 0 Vicryl pursestring suture was placed around  the fascial opening.  The Hasson port was placed through the opening and  insufflation of the abdomen was begun.  Next a 12 mm port was placed in the  patient's epigastrium and  two 5 mm ports were placed in the patient's right  flank under direct vision.  The gallbladder was identified and retracted  above the liver bed.  Dissection was then carried out at the base of the  gallbladder.  The cystic duct was then dissected out.  It  was clipped once  distally and partly opened with the scissors.  A cholangiocatheter was then  inserted through an angiocatheter in the right  upper quadrant under direct  vision.  The cholangiocatheter was then placed into the cystic duct.  Prior  to proceeding with the cholangiogram, the cystic artery was identified and  clipped proximally and distally and transected.  The cholangiogram was then  performed under direct fluoroscopy with contrast.  Contrast was seen to flow  easily into the entire biliary system, common bile duct, and duodenum  without evidence of abnormalities.  The cholangiocatheter was then removed.  The cystic duct was then clipped three times proximally and completely  transected.  The gallbladder was then slowly dissected free from the liver  bed with the electrocautery.  Once the gallbladder was retrieved from the  liver bed, it was placed in an Endosac and removed through the incision at  the umbilicus.  The liver bed was irrigated again and hemostasis was  achieved.  The 0  Vicryl at the umbilicus was then tied in place, closing the  fascial defect.  The abdomen was then copiously irrigated with normal  saline.  All ports were then removed under direct vision and the abdomen was  deflated.  All incisions were then anesthetized with 0.25% Marcaine and  closed with 4-0 Vicryl  subcuticular sutures.  Steri-Strips, gauze, and tape were applied.  The  patient tolerated the procedure well.  All sponge, needle, and instrument  counts were correct at the end of the procedure.  The patient was then  extubated in the operating room and taken in stable condition to the  recovery room.                                               Abigail Miyamoto, M.D.    DB/MEDQ  D:  04/15/2003  T:  04/15/2003  Job:  [id number]

## 2011-05-03 ENCOUNTER — Ambulatory Visit (INDEPENDENT_AMBULATORY_CARE_PROVIDER_SITE_OTHER): Payer: PRIVATE HEALTH INSURANCE

## 2011-05-03 DIAGNOSIS — J309 Allergic rhinitis, unspecified: Secondary | ICD-10-CM

## 2011-09-28 ENCOUNTER — Ambulatory Visit (INDEPENDENT_AMBULATORY_CARE_PROVIDER_SITE_OTHER): Payer: PRIVATE HEALTH INSURANCE

## 2011-09-28 DIAGNOSIS — J309 Allergic rhinitis, unspecified: Secondary | ICD-10-CM

## 2011-11-07 ENCOUNTER — Encounter: Payer: Self-pay | Admitting: Internal Medicine

## 2011-11-30 ENCOUNTER — Telehealth: Payer: Self-pay | Admitting: Internal Medicine

## 2011-11-30 NOTE — Telephone Encounter (Signed)
Called spoke with patient who c/o dry, high-pitched cough that occasionally produces small amounts clear mucus, sore throat and some wheezing x1week, worse x2days.  Pt requesting appt.  CDY with no openings.  appt scheduled with TP 12.20.12 @ 1630.  Pt okay with this time.

## 2011-12-01 ENCOUNTER — Ambulatory Visit (INDEPENDENT_AMBULATORY_CARE_PROVIDER_SITE_OTHER): Payer: PRIVATE HEALTH INSURANCE | Admitting: Internal Medicine

## 2011-12-01 ENCOUNTER — Encounter: Payer: Self-pay | Admitting: Internal Medicine

## 2011-12-01 ENCOUNTER — Ambulatory Visit: Payer: PRIVATE HEALTH INSURANCE | Admitting: Adult Health

## 2011-12-01 VITALS — BP 142/100 | HR 80 | Temp 99.1°F | Ht 71.0 in | Wt 203.0 lb

## 2011-12-01 DIAGNOSIS — Z8719 Personal history of other diseases of the digestive system: Secondary | ICD-10-CM

## 2011-12-01 DIAGNOSIS — J309 Allergic rhinitis, unspecified: Secondary | ICD-10-CM

## 2011-12-01 DIAGNOSIS — J40 Bronchitis, not specified as acute or chronic: Secondary | ICD-10-CM

## 2011-12-01 DIAGNOSIS — J069 Acute upper respiratory infection, unspecified: Secondary | ICD-10-CM

## 2011-12-01 MED ORDER — CEFTRIAXONE SODIUM 1 G IJ SOLR: 1.0000 g | Freq: Once | INTRAMUSCULAR | Status: DC

## 2011-12-01 MED ORDER — CEFTRIAXONE SODIUM 1 G IJ SOLR
1.0000 g | Freq: Once | INTRAMUSCULAR | Status: AC
  Administered 2011-12-01: 1 g via INTRAMUSCULAR

## 2011-12-01 MED ORDER — CEFTRIAXONE SODIUM 1 G IJ SOLR: 1.0000 g | INTRAMUSCULAR | Status: DC

## 2011-12-10 NOTE — Patient Instructions (Signed)
Take antibiotic as prescribed. Call if not better in one week or sooner if worse.

## 2011-12-10 NOTE — Progress Notes (Signed)
  Subjective:    Patient ID: Ricky Sharp, male    DOB: September 27, 1961, 50 y.o.   MRN: [id number]  HPI 50 year old white male in today with respiratory infection. Has been sick for over a week maybe 2 weeks. Planning to go out of town for the Christmas holidays. His wife has recently had pneumonia. Daughters have been sick as well. He has had a fever and cough with discolored sputum. He's worried about pneumonia. Has a history of allergic rhinitis and GE reflux.    Review of Systems     Objective:   Physical Exam HEENT exam: TMs are clear; pharynx very slightly injected without exudate; neck supple without adenopathy; chest clear to auscultation without rales or wheezing        Assessment & Plan:  Bronchitis  History of allergic rhinitis  History of GE reflux  Plan: Rocephin 1 g IM given in office today; Levaquin 500 milligrams daily for 10 days; Hycodan 8 ounces 1 teaspoon by mouth every 6 hours when necessary cough.

## 2012-01-03 ENCOUNTER — Ambulatory Visit (INDEPENDENT_AMBULATORY_CARE_PROVIDER_SITE_OTHER): Payer: PRIVATE HEALTH INSURANCE | Admitting: Internal Medicine

## 2012-01-03 ENCOUNTER — Encounter: Payer: Self-pay | Admitting: Internal Medicine

## 2012-01-03 DIAGNOSIS — F419 Anxiety disorder, unspecified: Secondary | ICD-10-CM

## 2012-01-03 DIAGNOSIS — I10 Essential (primary) hypertension: Secondary | ICD-10-CM

## 2012-01-03 DIAGNOSIS — F411 Generalized anxiety disorder: Secondary | ICD-10-CM

## 2012-01-10 ENCOUNTER — Encounter: Payer: Self-pay | Admitting: Internal Medicine

## 2012-01-10 ENCOUNTER — Ambulatory Visit (INDEPENDENT_AMBULATORY_CARE_PROVIDER_SITE_OTHER): Payer: PRIVATE HEALTH INSURANCE | Admitting: Internal Medicine

## 2012-01-10 VITALS — BP 114/80 | HR 88 | Temp 97.8°F | Wt 202.0 lb

## 2012-01-10 DIAGNOSIS — F329 Major depressive disorder, single episode, unspecified: Secondary | ICD-10-CM

## 2012-01-10 DIAGNOSIS — I10 Essential (primary) hypertension: Secondary | ICD-10-CM | POA: Insufficient documentation

## 2012-01-10 DIAGNOSIS — F32A Depression, unspecified: Secondary | ICD-10-CM

## 2012-01-10 DIAGNOSIS — F419 Anxiety disorder, unspecified: Secondary | ICD-10-CM

## 2012-01-10 DIAGNOSIS — F3289 Other specified depressive episodes: Secondary | ICD-10-CM

## 2012-01-10 DIAGNOSIS — F341 Dysthymic disorder: Secondary | ICD-10-CM

## 2012-01-10 NOTE — Patient Instructions (Signed)
Begin Cozaar 50 mg every morning for blood pressure. Take Klonopin twice daily as needed for anxiety. Began antidepressant. Return in one week. Keep blood pressure readings.

## 2012-01-10 NOTE — Progress Notes (Signed)
  Subjective:    Patient ID: Ricky Sharp, male    DOB: 07-12-1961, 51 y.o.   MRN: [id number]  HPI Was seen last week with significant anxiety and depression issues. Blood pressure was elevated. Patient was started on Cozaar 50 mg daily, SSRI antidepressant and Klonopin. He was having some anxiety issues and panic attacks. Work is stressful. Wife is an Pensions consultant and works long hours. 2 teenage children. One is a Holiday representative in high school planning to go off college next year. Worried about mother who lives in [place], [place] with declining health.    Review of Systems     Objective:   Physical Exam neck no JVD or carotid bruits; cardiac exam regular rate and rhythm; chest clear; extremities without edema        Assessment & Plan:  Anxiety  Depression  Hypertension  Plan: Continue same medications and return in 4-6 weeks.

## 2012-01-10 NOTE — Progress Notes (Signed)
  Subjective:    Patient ID: Ricky Sharp, male    DOB: 13-Jul-1961, 51 y.o.   MRN: [id number]  HPI Patient in today for an acute visit. Has felt anxious. Took some leftover Xanax without much relief. Began to have some anxiety/panic-type symptoms today. Says he is worried about his mothers health. She lives in [place], [place] and has had some decline in her health status. Stepfather in law has been in the hospital which is been stressful. Wife is an Pensions consultant and works a lot of hours. They have 2 teenage children one of whom is a Holiday representative in high school. 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 Denies chest pain. Worried about his blood pressure. Children do well in school. They enjoy skiing. Patient likes free time to pursue his own hobbies and interests. Lately he's not had much free time. Work is stressful. Wife has history of metastatic colon cancer but has been doing remarkably well for several years.    Review of Systems flat affect,, in the office.     Objective:   Physical Exam neck without JVD thyromegaly or carotid bruits; chest clear to auscultation; cardiac exam regular rate and rhythm normal S1 and S2        Assessment & Plan:  Hypertension  Anxiety  Possible depression  Plan: Patient started on Cozaar 50 mg daily with plans to recheck blood pressure in one week. Instead of Xanax try Klonopin 0.5 mg one half to one by mouth twice daily. Start SSRI to help with anxiety.  Time spent with patient counseling and listening to issues 30 minutes

## 2012-01-27 ENCOUNTER — Telehealth: Payer: Self-pay | Admitting: Internal Medicine

## 2012-01-27 NOTE — Telephone Encounter (Signed)
He can stop it and keep recheck appointment.

## 2012-01-30 NOTE — Telephone Encounter (Signed)
Called Ricky Sharp and advised him it was ok to stop medication and keep follow up appt.

## 2012-02-09 DIAGNOSIS — F32A Depression, unspecified: Secondary | ICD-10-CM | POA: Insufficient documentation

## 2012-02-09 DIAGNOSIS — F329 Major depressive disorder, single episode, unspecified: Secondary | ICD-10-CM | POA: Insufficient documentation

## 2012-02-09 NOTE — Patient Instructions (Signed)
Continue same medications and return in 4-6 weeks 

## 2012-02-10 ENCOUNTER — Ambulatory Visit (INDEPENDENT_AMBULATORY_CARE_PROVIDER_SITE_OTHER): Payer: PRIVATE HEALTH INSURANCE | Admitting: Internal Medicine

## 2012-02-10 ENCOUNTER — Encounter: Payer: Self-pay | Admitting: Internal Medicine

## 2012-02-10 VITALS — BP 148/108 | HR 80 | Temp 97.0°F | Wt 206.0 lb

## 2012-02-10 DIAGNOSIS — I10 Essential (primary) hypertension: Secondary | ICD-10-CM

## 2012-02-10 DIAGNOSIS — F419 Anxiety disorder, unspecified: Secondary | ICD-10-CM

## 2012-02-10 DIAGNOSIS — F411 Generalized anxiety disorder: Secondary | ICD-10-CM

## 2012-02-11 LAB — BASIC METABOLIC PANEL
CO2: 26 mEq/L (ref 19–32)
Calcium: 9.5 mg/dL (ref 8.4–10.5)
Creat: 0.96 mg/dL (ref 0.50–1.35)
Glucose, Bld: 105 mg/dL — ABNORMAL HIGH (ref 70–99)

## 2012-02-12 NOTE — Patient Instructions (Signed)
Try of Bystolic 5 mg daily for hypertension. Try Vibryd 10 mg initially increasing to 20 mg daily. Samples provided. Return in 2-3 weeks.

## 2012-02-12 NOTE — Progress Notes (Signed)
  Subjective:    Patient ID: Ricky Sharp, male    DOB: 04/27/1961, 51 y.o.   MRN: [id number]  HPI Patient called recently and said he felt dizzy on losartan so we discontinued it. However, blood pressure is markedly elevated today and I do think he needs something for hypertension. He called his mother and discovered that she is on Hyzaar which works well for her. Still under a great deal of stress. Mother is not well with history of COPD. Stepfather loll has been [place]. Wife is very busy at work and is not able to help out much at home. He has 2 teenage daughters one of whom is graduating from high school this year and is looking at colleges. He has to say that children get taken various places throughout the week for activities et [place]. Doesn't have much time for himself. Likes to ride his bicycle for long distances and hasn't been able to do that. Affect is a bit flat and he seems a bit depressed. Also seems anxious. Not enjoying his work.    Review of Systems     Objective:   Physical Exam chest clear to auscultation; cardiac exam regular rate and rhythm; extremities without edema. Thought process is appropriate. Does seem slightly anxious and dysphoric.        Assessment & Plan:  Anxiety  Depression  Hypertension  Plan: Change to Bystolic 5 mg daily. Samples provided. Try Vibryd 10 mg daily increasing to 20 mg daily for anxiety depression. Return in 2-3 weeks.  B- met drawn today as it was believed he was taking losartan however he had discontinued that previously per telephone call. B- met today is normal.

## 2012-02-15 ENCOUNTER — Telehealth: Payer: Self-pay | Admitting: Internal Medicine

## 2012-02-15 NOTE — Telephone Encounter (Signed)
Also started the Viibryid as well, but thinks the side effects are from the Bystolic.  Going out of town Friday.

## 2012-02-15 NOTE — Telephone Encounter (Signed)
Called patient back and advised per Dr. Lenord Fellers to stop Bystolic, continue Viibryd and anti-anxiety medication and we would refer patient to a cardiologist.  Advised patient it may take a few weeks to get appt with cardiologist.  Patient verbalized understanding.

## 2012-02-16 ENCOUNTER — Telehealth: Payer: Self-pay

## 2012-02-16 DIAGNOSIS — I10 Essential (primary) hypertension: Secondary | ICD-10-CM

## 2012-02-16 NOTE — Telephone Encounter (Signed)
Patient scheduled for appointment with Dr. Elease Hashimoto on 03/20/2012 at 3:30pm. Informed of this.

## 2012-02-24 ENCOUNTER — Ambulatory Visit: Payer: PRIVATE HEALTH INSURANCE | Admitting: Internal Medicine

## 2012-03-20 ENCOUNTER — Encounter: Payer: Self-pay | Admitting: Cardiovascular Disease

## 2012-03-20 ENCOUNTER — Ambulatory Visit (INDEPENDENT_AMBULATORY_CARE_PROVIDER_SITE_OTHER): Payer: PRIVATE HEALTH INSURANCE | Admitting: Cardiovascular Disease

## 2012-03-20 VITALS — BP 170/104 | HR 82 | Ht 70.5 in | Wt 205.8 lb

## 2012-03-20 DIAGNOSIS — I10 Essential (primary) hypertension: Secondary | ICD-10-CM

## 2012-03-20 MED ORDER — POTASSIUM CHLORIDE CRYS ER 10 MEQ PO TBCR: 10.0000 meq | EXTENDED_RELEASE_TABLET | Freq: Every day | ORAL | Status: DC

## 2012-03-20 MED ORDER — HYDROCHLOROTHIAZIDE 25 MG PO TABS: 25.0000 mg | ORAL_TABLET | Freq: Every day | ORAL | Status: DC

## 2012-03-20 NOTE — Patient Instructions (Addendum)
Your physician recommends that you schedule a follow-up appointment in: 2 months   Your physician recommends that you return for lab work in: 2 months bmet  Your physician has recommended you make the following change in your medication:   START HCTZ 25 MG DAILY IN AM START POTASSIUM 10 MEQ DAILY WITH THE HCTZ    DASH Diet The DASH diet stands for "Dietary Approaches to Stop Hypertension." It is a healthy eating plan that has been shown to reduce high blood pressure (hypertension) in as little as 14 days, while also possibly providing other significant health benefits. These other health benefits include reducing the risk of breast cancer after menopause and reducing the risk of type 2 diabetes, heart disease, colon cancer, and stroke. Health benefits also include weight loss and slowing kidney failure in patients with chronic kidney disease.  DIET GUIDELINES  Limit salt (sodium). Your diet should contain less than 1500 mg of sodium daily.   Limit refined or processed carbohydrates. Your diet should include mostly whole grains. Desserts and added sugars should be used sparingly.   Include small amounts of heart-healthy fats. These types of fats include nuts, oils, and tub margarine. Limit saturated and trans fats. These fats have been shown to be harmful in the body.  CHOOSING FOODS  The following food groups are based on a 2000 calorie diet. See your Registered Dietitian for individual calorie needs. Grains and Grain Products (6 to 8 servings daily)  Eat More Often: Whole-wheat bread, brown rice, whole-grain or wheat pasta, quinoa, popcorn without added fat or salt (air popped).   Eat Less Often: White bread, white pasta, white rice, cornbread.  Vegetables (4 to 5 servings daily)  Eat More Often: Fresh, frozen, and canned vegetables. Vegetables may be raw, steamed, roasted, or grilled with a minimal amount of fat.   Eat Less Often/Avoid: Creamed or fried vegetables. Vegetables in a  cheese sauce.  Fruit (4 to 5 servings daily)  Eat More Often: All fresh, canned (in natural juice), or frozen fruits. Dried fruits without added sugar. One hundred percent fruit juice ( cup [237 mL] daily).   Eat Less Often: Dried fruits with added sugar. Canned fruit in light or heavy syrup.  [employer], Fish, and Poultry (2 servings or less daily. One serving is 3 to 4 oz [85-114 g]).  Eat More Often: Ninety percent or leaner ground beef, tenderloin, sirloin. Round cuts of beef, chicken breast, [place] breast. All fish. Grill, bake, or broil your meat. Nothing should be fried.   Eat Less Often/Avoid: Fatty cuts of meat, [place], or chicken leg, thigh, or wing. Fried cuts of meat or fish.  Dairy (2 to 3 servings)  Eat More Often: Low-fat or fat-free milk, low-fat plain or light yogurt, reduced-fat or part-skim cheese.   Eat Less Often/Avoid: Milk (whole, 2%, skim, or chocolate).Whole milk yogurt. Full-fat cheeses.  Nuts, Seeds, and Legumes (4 to 5 servings per week)  Eat More Often: All without added salt.   Eat Less Often/Avoid: Salted nuts and seeds, canned beans with added salt.  Fats and Sweets (limited)  Eat More Often: Vegetable oils, tub margarines without trans fats, sugar-free gelatin. Mayonnaise and salad dressings.   Eat Less Often/Avoid: Coconut oils, palm oils, butter, stick margarine, cream, half and half, cookies, candy, pie.  FOR MORE INFORMATION The Dash Diet Eating Plan: www.dashdiet.org Document Released: 11/17/2011 Document Reviewed: 11/07/2011 [facility] Patient Information 2012 [place], [place].

## 2012-03-20 NOTE — Assessment & Plan Note (Signed)
Ricky Sharp presents with persistent hypertension. He has a lot of stressors in his life. He's had a reaction to diastolic also had a reaction to losartan.  We'll start him on HCTZ 25 mg a day as well as potassium chloride 10 mg a day. I've asked him to exercise on regular basis. He is an avid cyclist and has not been able to get out on his bike recently.  To keep a reading of his blood pressure recordings. I'll see him in 2 months for an office visit as well as basic metabolic profile. Will review his blood pressure readings at that time.

## 2012-03-20 NOTE — Progress Notes (Signed)
Jocelyn Lamer Date of Birth  Jan 20, 1961 [facility]     [place] Office  [address]    [address]   [address] [place], [place]  [address]    [place], [place]  [address] [phone]  Fax  [phone]  [phone]  Fax [phone]  Problem List: 1. Hypertension 2. Anxiety - stress related 3. GERD  History of Present Illness:  Patient is a 51 year old gentleman with a history of hypertension. He's tried several medications in the past that have not completely controlled his blood pressure.    - He has tried Losartan but had some dizziness / dry mouth.    - tried bystolic 5 mg  - felt poorly, persistant head ache, diarrhea, mild respiratory distress.  Tried for a week   He has also tried some antianxiety meds but had various reactions.   He has multiple family and life stresses.    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  He is also having some family health issues that require him to spend lots of time with them.  Current Outpatient Prescriptions on File Prior to Visit  Medication Sig Dispense Refill  . abatacept (ORENCIA) 250 MG injection TABLET FORM  250mg  as directed       . azelastine (ASTELIN) 137 MCG/SPRAY nasal spray 2 sprays by Nasal route 2 (two) times daily. Use in each nostril as directed  30 mL  prn  . cromolyn (NASALCROM) 5.2 MG/ACT nasal spray Place 1 spray into the nose 4 (four) times daily.        Marland Kitchen dexlansoprazole (DEXILANT) 60 MG capsule Take 60 mg by mouth daily as needed.       Marland Kitchen EPINEPHrine (EPIPEN JR) 0.15 MG/0.3ML injection Inject 0.3 mLs (0.15 mg total) into the muscle as needed. For severe allergic reaction  1 each  prn  . fluticasone (VERAMYST) 27.5 MCG/SPRAY nasal spray 2 sprays by Nasal route daily.  10 g  prn  . ibuprofen (ADVIL,MOTRIN) 200 MG tablet Take 200 mg by mouth every 6 (six) hours as needed.        Marland Kitchen olopatadine (PATANOL) 0.1 % ophthalmic solution Place 1 drop into  both eyes 2 (two) times daily.  5 mL  prn    Allergies  Allergen Reactions  . Cozaar     Vertigo    Past Medical History  Diagnosis Date  . Asthma   . Allergic rhinitis   . GERD (gastroesophageal reflux disease)     Past Surgical History  Procedure Date  . Cholecystectomy   . Tonsillectomy   . Osteochondroma excision     History  Smoking status  . Never Smoker   Smokeless tobacco  . Not on file    History  Alcohol Use No    No family history on file.  Reviw of Systems:  Reviewed in the HPI.  All other systems are negative.  Physical Exam: Blood pressure 170/104, pulse 82, height 5' 10.5" (1.791 m), weight 205 lb 12.8 oz (93.35 kg). General: Well developed, well nourished, in no acute distress.  Head: Normocephalic, atraumatic, sclera non-icteric, mucus membranes are moist,   Neck: Supple. Carotids are 2 + without bruits. No JVD  Lungs: Clear bilaterally to auscultation.  Heart: regular rate.  normal  S1 S2. No murmurs, gallops or rubs.  Abdomen: Soft, non-tender, non-distended with normal bowel sounds. No hepatomegaly. No rebound/guarding. No masses.  Msk:  Strength and tone are normal  Extremities: No clubbing or cyanosis. No edema.  Distal pedal pulses are 2+ and equal bilaterally.  Neuro: Alert and oriented X 3. Moves all extremities spontaneously.  Psych:  Responds to questions appropriately with a normal affect.  ECG: March 20, 2012-- NSR, right atrial enlargement  Assessment / Plan:

## 2012-04-11 ENCOUNTER — Ambulatory Visit (INDEPENDENT_AMBULATORY_CARE_PROVIDER_SITE_OTHER): Payer: Commercial Managed Care - PPO

## 2012-04-11 DIAGNOSIS — J309 Allergic rhinitis, unspecified: Secondary | ICD-10-CM

## 2012-04-16 ENCOUNTER — Telehealth: Payer: Self-pay | Admitting: Internal Medicine

## 2012-04-16 MED ORDER — ALPRAZOLAM 0.25 MG PO TABS: 0.2500 mg | ORAL_TABLET | Freq: Every evening | ORAL | Status: DC | PRN

## 2012-04-16 NOTE — Telephone Encounter (Signed)
Please call in Xanax generic  #30  Tabs Sig: 1/2- one tab qhs with 3 refills to [facility]

## 2012-04-19 ENCOUNTER — Encounter: Payer: Self-pay | Admitting: Internal Medicine

## 2012-04-19 ENCOUNTER — Ambulatory Visit: Payer: PRIVATE HEALTH INSURANCE | Admitting: Internal Medicine

## 2012-04-19 ENCOUNTER — Ambulatory Visit (INDEPENDENT_AMBULATORY_CARE_PROVIDER_SITE_OTHER): Payer: Commercial Managed Care - PPO | Admitting: Internal Medicine

## 2012-04-19 VITALS — BP 120/78 | HR 86 | Ht 70.5 in | Wt 204.8 lb

## 2012-04-19 DIAGNOSIS — J302 Other seasonal allergic rhinitis: Secondary | ICD-10-CM

## 2012-04-19 DIAGNOSIS — J309 Allergic rhinitis, unspecified: Secondary | ICD-10-CM

## 2012-04-19 DIAGNOSIS — J45909 Unspecified asthma, uncomplicated: Secondary | ICD-10-CM

## 2012-04-19 DIAGNOSIS — J4520 Mild intermittent asthma, uncomplicated: Secondary | ICD-10-CM

## 2012-04-19 DIAGNOSIS — J3089 Other allergic rhinitis: Secondary | ICD-10-CM

## 2012-04-19 NOTE — Patient Instructions (Addendum)
Continue allergy vaccine  Sample Dymista nasal steroid/ antihistamine spray      Try 1-2 puffs each nostril every night at bedtime.  Please call as needed

## 2012-04-19 NOTE — Progress Notes (Signed)
  Subjective:    Patient ID: Ricky Sharp, male    DOB: 03-Jun-1961, 51 y.o.   MRN: [id number]  HPI 04/14/11-50 yoM never smoker followecd for asthma and allergic rhinitis, complicated by GERD.  Last here 11/25/10 for acute visit. Reports no particular problems since then except this year Spring pollen season has been worse. Eyes burn and water, nasal congestion, choking sensation. Denies chest tightness or wheeze. Mucus is yellow green with no fever or obvious infection. Has used saline rinse and antihistamines every few days. . Not using the nasal steroid or Astepro. Says with hx of reflux, he feels better if he minimizes meds, especially inhaled steroids.  Continues allergy vaccine at 1:10 GO.   04/19/12- 50 yoM never smoker followecd for asthma and allergic rhinitis, complicated by GERD Seasonal flare up-watery eyes, slight itch, and sneezing with drainage; still on vaccine and doing well. Perennial and seasonal rhinitis. Vaccine is sufficient. He rarely needs antihistamines but was interested we discussed nasal sprays. No wheezing.  ROS-see HPI Constitutional:   No-   weight loss, night sweats, fevers, chills, fatigue, lassitude. HEENT:   No-  headaches, difficulty swallowing, tooth/dental problems, sore throat,       + sneezing, itching, ear ache, nasal congestion, post nasal drip,  CV:  No-   chest pain, orthopnea, PND, swelling in lower extremities, anasarca, dizziness, palpitations Resp: No-   shortness of breath with exertion or at rest.              No-   productive cough,  No non-productive cough,  No- coughing up of blood.              No-   change in color of mucus.  No- wheezing.   Skin: No-   rash or lesions. GI:  No-   heartburn, indigestion, abdominal pain, nausea, vomiting, GU:  MS:  No-   joint pain or swelling.   Neuro-     nothing unusual Psych:  No- change in mood or affect. No depression or anxiety.  No memory loss.  OBJ- Physical Exam General- Alert, Oriented,  Affect-appropriate, Distress- none acute Skin- rash-none, lesions- none, excoriation- none Lymphadenopathy- none Head- atraumatic            Eyes- Gross vision intact, PERRLA, conjunctivae and secretions clear. ? Slight proptosis?            Ears- Hearing, canals-normal            Nose- Clear, no-Septal dev, mucus, polyps, erosion, perforation             Throat- Mallampati III-IV , mucosa clear , drainage- none, tonsils- atrophic Neck- flexible , trachea midline, no stridor , thyroid nl, carotid no bruit Chest - symmetrical excursion , unlabored           Heart/CV- RRR , no murmur , no gallop  , no rub, nl s1 s2                           - JVD- none , edema- none, stasis changes- none, varices- none           Lung- clear to P&A, wheeze- none, cough- none , dullness-none, rub- none           Chest wall-  Abd-  Br/ Gen/ Rectal- Not done, not indicated Extrem- cyanosis- none, clubbing, none, atrophy- none, strength- nl Neuro- grossly intact to observation

## 2012-04-22 NOTE — Assessment & Plan Note (Signed)
He is well satisfied to allergy vaccine, credited with significant improvement and stability. He did choose to try a sample of Dynamist

## 2012-04-22 NOTE — Assessment & Plan Note (Signed)
Currently well controlled.  

## 2012-05-18 ENCOUNTER — Ambulatory Visit (INDEPENDENT_AMBULATORY_CARE_PROVIDER_SITE_OTHER): Payer: Commercial Managed Care - PPO | Admitting: Cardiovascular Disease

## 2012-05-18 ENCOUNTER — Encounter: Payer: Self-pay | Admitting: Cardiovascular Disease

## 2012-05-18 VITALS — BP 122/82 | HR 86 | Ht 70.0 in | Wt 199.0 lb

## 2012-05-18 DIAGNOSIS — I10 Essential (primary) hypertension: Secondary | ICD-10-CM

## 2012-05-18 LAB — BASIC METABOLIC PANEL
Calcium: 9.4 mg/dL (ref 8.4–10.5)
Creatinine, Ser: 0.9 mg/dL (ref 0.4–1.5)
Sodium: 139 mEq/L (ref 135–145)

## 2012-05-18 NOTE — Assessment & Plan Note (Signed)
Ricky Sharp is doing quite well on his current medications. His blood pressure has been normal. He's tolerating the meds. He still has not been getting quite as much exercise as he would like.  We'll continue with the HCTZ and potassium. I seen again in 6 months. We'll check fasting labs at that time. I've encouraged him to get out and exercise as the weather gets nicer.

## 2012-05-18 NOTE — Progress Notes (Signed)
Jocelyn Lamer Date of Birth  06-15-1961 [facility]     [place] Office  [address]    [address]   [address] [place], [place]  [address]    [place], [place]  [address] [phone]  Fax  [phone]  [phone]  Fax [phone]  Problem List: 1. Hypertension 2. Anxiety - stress related 3. GERD  History of Present Illness:  Patient is a 51 year old gentleman with a history of hypertension. He's tried several medications in the past that have not completely controlled his blood pressure.    - He has tried Losartan but had some dizziness / dry mouth.    - tried bystolic 5 mg  - felt poorly, persistant head ache, diarrhea, mild respiratory distress.  Tried for a week   He has also tried some antianxiety meds but had various reactions.   He has multiple family and life stresses.    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  He is also having some family health issues that require him to spend lots of time with them.  He has felt well on his HCTZ and potassium since his last visit.  He has not been exercising as much as he would like.   Current Outpatient Prescriptions on File Prior to Visit  Medication Sig Dispense Refill  . abatacept (ORENCIA) 250 MG injection TABLET FORM  250mg  as directed       . ALPRAZolam (XANAX) 0.25 MG tablet Take 1 tablet (0.25 mg total) by mouth at bedtime as needed.  30 tablet  3  . cromolyn (NASALCROM) 5.2 MG/ACT nasal spray Place 1 spray into the nose 4 (four) times daily.        Marland Kitchen dexlansoprazole (DEXILANT) 60 MG capsule Take 60 mg by mouth daily as needed.       Marland Kitchen EPINEPHrine (EPIPEN JR) 0.15 MG/0.3ML injection Inject 0.3 mLs (0.15 mg total) into the muscle as needed. For severe allergic reaction  1 each  prn  . fluticasone (VERAMYST) 27.5 MCG/SPRAY nasal spray 2 sprays by Nasal route daily.  10 g  prn  . hydrochlorothiazide (HYDRODIURIL) 25 MG tablet Take 1 tablet (25 mg  total) by mouth daily.  30 tablet  5  . ibuprofen (ADVIL,MOTRIN) 200 MG tablet Take 200 mg by mouth every 6 (six) hours as needed.        Marland Kitchen olopatadine (PATANOL) 0.1 % ophthalmic solution Place 1 drop into both eyes 2 (two) times daily.  5 mL  prn  . potassium chloride (K-DUR,KLOR-CON) 10 MEQ tablet Take 1 tablet (10 mEq total) by mouth daily.  30 tablet  5    Allergies  Allergen Reactions  . Cozaar     Vertigo    Past Medical History  Diagnosis Date  . Asthma   . Allergic rhinitis   . GERD (gastroesophageal reflux disease)     Past Surgical History  Procedure Date  . Cholecystectomy   . Tonsillectomy   . Osteochondroma excision     History  Smoking status  . Never Smoker   Smokeless tobacco  . Not on file    History  Alcohol Use No    No family history on file.  Reviw of Systems:  Reviewed in the HPI.  All other systems are negative.  Physical Exam: Blood pressure 122/82, pulse 86, height 5\' 10"  (1.778 m), weight 199 lb (90.266 kg), SpO2 98.00%. General: Well developed,  well nourished, in no acute distress.  Head: Normocephalic, atraumatic, sclera non-icteric, mucus membranes are moist,   Neck: Supple. Carotids are 2 + without bruits. No JVD  Lungs: Clear bilaterally to auscultation.  Heart: regular rate.  normal  S1 S2. No murmurs, gallops or rubs.  Abdomen: Soft, non-tender, non-distended with normal bowel sounds. No hepatomegaly. No rebound/guarding. No masses.  Msk:  Strength and tone are normal  Extremities: No clubbing or cyanosis. No edema.  Distal pedal pulses are 2+ and equal bilaterally.  Neuro: Alert and oriented X 3. Moves all extremities spontaneously.  Psych:  Responds to questions appropriately with a normal affect.  ECG: March 20, 2012-- NSR, right atrial enlargement  Assessment / Plan:

## 2012-05-18 NOTE — Patient Instructions (Signed)
Your physician recommends that you return for lab work in: TODAY BMET  Your physician wants you to follow-up in: 6 MONTHS  You will receive a reminder letter in the mail two months in advance. If you don't receive a letter, please call our office to schedule the follow-up appointment.   Your physician recommends that you return for a FASTING lipid profile: 6 MONTHS    

## 2012-05-22 NOTE — Progress Notes (Signed)
Lmtcb///med change

## 2012-05-23 ENCOUNTER — Telehealth: Payer: Self-pay | Admitting: *Deleted

## 2012-05-23 DIAGNOSIS — E876 Hypokalemia: Secondary | ICD-10-CM

## 2012-05-23 DIAGNOSIS — I10 Essential (primary) hypertension: Secondary | ICD-10-CM

## 2012-05-23 MED ORDER — POTASSIUM CHLORIDE CRYS ER 20 MEQ PO TBCR: 20.0000 meq | EXTENDED_RELEASE_TABLET | Freq: Every day | ORAL | Status: DC

## 2012-05-23 NOTE — Telephone Encounter (Signed)
Pt will increase potassium and will f/u with bmet in 4 weeks

## 2012-05-23 NOTE — Telephone Encounter (Signed)
Message copied by Antony Odea on Wed May 23, 2012  8:25 AM ------      Message from: McArthur, Deloris Ping      Created: Fri May 18, 2012  6:26 PM       His creatinine and sodium are normal. His potassium is low. Increase his potassium to 20 mEq a day

## 2012-06-29 ENCOUNTER — Other Ambulatory Visit (INDEPENDENT_AMBULATORY_CARE_PROVIDER_SITE_OTHER): Payer: Commercial Managed Care - PPO

## 2012-06-29 DIAGNOSIS — E876 Hypokalemia: Secondary | ICD-10-CM

## 2012-06-29 DIAGNOSIS — I10 Essential (primary) hypertension: Secondary | ICD-10-CM

## 2012-06-29 LAB — BASIC METABOLIC PANEL
CO2: 28 mEq/L (ref 19–32)
Calcium: 9.3 mg/dL (ref 8.4–10.5)
GFR: 79.58 mL/min (ref >60.00)
Glucose, Bld: 98 mg/dL (ref 70–99)
Potassium: 3.7 mEq/L (ref 3.5–5.1)
Sodium: 139 mEq/L (ref 135–145)

## 2012-07-12 ENCOUNTER — Ambulatory Visit (INDEPENDENT_AMBULATORY_CARE_PROVIDER_SITE_OTHER): Payer: Commercial Managed Care - PPO | Admitting: Internal Medicine

## 2012-07-12 ENCOUNTER — Encounter: Payer: Self-pay | Admitting: Internal Medicine

## 2012-07-12 VITALS — BP 140/84 | HR 80 | Temp 98.0°F | Ht 70.5 in | Wt 202.0 lb

## 2012-07-12 DIAGNOSIS — Z7189 Other specified counseling: Secondary | ICD-10-CM

## 2012-07-12 DIAGNOSIS — F32A Depression, unspecified: Secondary | ICD-10-CM

## 2012-07-12 DIAGNOSIS — Z569 Unspecified problems related to employment: Secondary | ICD-10-CM

## 2012-07-12 DIAGNOSIS — Z566 Other physical and mental strain related to work: Secondary | ICD-10-CM

## 2012-07-12 DIAGNOSIS — F411 Generalized anxiety disorder: Secondary | ICD-10-CM

## 2012-07-12 DIAGNOSIS — F419 Anxiety disorder, unspecified: Secondary | ICD-10-CM

## 2012-07-12 DIAGNOSIS — F329 Major depressive disorder, single episode, unspecified: Secondary | ICD-10-CM

## 2012-07-12 DIAGNOSIS — Z630 Problems in relationship with spouse or partner: Secondary | ICD-10-CM

## 2012-07-12 DIAGNOSIS — F3289 Other specified depressive episodes: Secondary | ICD-10-CM

## 2012-07-12 NOTE — Progress Notes (Signed)
Subjective:    Patient ID: Ricky Sharp, male    DOB: 05/17/1961, 51 y.o.   MRN: [id number]  HPI 51 year old white male with history of social anxiety for years. He and his wife recently in seeing [facility] for marriage counseling. Oldest daughter is getting ready to go off to college it [place]. Another daughter is in high school. He feels that he's been very responsible trying to deal with his children as best he could. He feels like his wife plans activities that he doesn't particularly enjoy and finds it hard to participate. There are some disagreements about money. His wife is an Pensions consultant, Primary school teacher in litigation and real estate. 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 This past weekend he was in [place] for several days working at [employer] office location correcting multiple problems. He attended prep school at [employer] and for awhile was employed by the [employer]. He is an Financial controller in language. He says parents separated while he was in prep school. He said he did not realize for some time that they had separated. He would phone home once a week and both parents would apparently be at home. He later found out they had separated and it was devastating for him. He doesn't want to separate because of his concern for his children. His wife is Catholic and she doesn't want to divorce him but feels that there's not much intimacy or quality time. He says she works a Cytogeneticist. He feels that she leaves him sometimes in position of having to handle things on his own. They were at the at the [facility]  recently for a family vacation with her other family members. He had to sponsor and host dinner one night. She left and came back to [place] to work for a few days. He felt like she should have been there helping him entertain folks because it's difficult for him to do that socially.  Some of the folks he did not know well or feel comfortable with. If he's in an uncomfortable social situation such as  a cocktail party, he will basically just leave his he's feeling anxious. We've tried medicating him with benzodiazepines. At one point he was on SSRIs but he says they really don't help very much. He's introverted but quite bright. He feels like wife is putting pressure on him to "be fixed ". Wife wants youngest daughter to attend a ski school in [place] in September which is expensive. Wife wants him to go with daughter to [place].  He is concerned about the expense of  trip and taking time off work. He has an upcoming performance reviewed at work in November. He says he recently refinanced their home and feels that they're financially stretched and it wife is not being realistic about that. He likes to spend time would working in his garage. Marriage counselor has identified that wife is quite angry with him. Wife feels that he has not invested time watching his children grow up but in many ways he has. Wife had metastatic colon cancer and was treated successfully at [facility]. This is been very difficult for the entire family. She's doing well now after several years and is off chemotherapy. Wife has a lot of migraine headaches.  He likes bicycling and doing outdoor activities as well as woodworking. Feels that he needs some time alone to decompress.    Review of Systems     Objective:   Physical Exam spent  30 minutes speaking with patient today about these issues. His affect is flat and he appears to be depressed. He denies being suicidal. Doesn't seem to have much joy in life. Speech is monotone. At one point he did laugh. He laughs a bit loudly. Has some resentment about attorneys wife has to work with. Feels that they put a lot of pressure on his wife and were not always cooperative when she was ill. He doesn't like spending time with them socially.         Assessment & Plan:  Marital stress  Job stress  Plan: Suggest medication consultation with Dr. Shane Crutch in [place].  Patient may need to have individual psychotherapy with someone other than marriage counselor at this point.

## 2012-07-12 NOTE — Patient Instructions (Addendum)
We will arrange appointment for medication consultation with Dr. Shane Crutch in [place].

## 2012-08-16 ENCOUNTER — Telehealth: Payer: Self-pay | Admitting: Internal Medicine

## 2012-08-17 NOTE — Telephone Encounter (Signed)
But they cannot guarantee payment.  They would ask for self payment up front and then patient would assume the role of filing his own insurance with [employer].  Patient MAY or may NOT have out of network benefits.  So, he is best to check with his carrier to see if he has out of network benefits.  If he has none, [employer] is not going to pay anything.  If he DOES, they will pay a limited amount as an out of network provider.  How do you want to proceed from here Dr. Lenord Fellers?

## 2012-08-17 NOTE — Telephone Encounter (Signed)
He can try contacting Dr. Emerson Monte here in town. [phone]

## 2012-08-23 NOTE — Telephone Encounter (Signed)
Pt given Dr. Loralie Champagne name and number.  Pt will contact her.

## 2012-08-27 ENCOUNTER — Telehealth: Payer: Self-pay | Admitting: Internal Medicine

## 2012-08-27 DIAGNOSIS — J309 Allergic rhinitis, unspecified: Secondary | ICD-10-CM

## 2012-08-27 MED ORDER — AZELASTINE-FLUTICASONE 137-50 MCG/ACT NA SUSP: 1.0000 | Freq: Every day | NASAL | Status: DC

## 2012-08-27 NOTE — Telephone Encounter (Signed)
Refill sent. Pt is aware. Nigel Ericsson, CMA  

## 2012-09-05 ENCOUNTER — Ambulatory Visit: Payer: Self-pay

## 2012-10-02 ENCOUNTER — Other Ambulatory Visit: Payer: Self-pay | Admitting: *Deleted

## 2012-10-02 DIAGNOSIS — I10 Essential (primary) hypertension: Secondary | ICD-10-CM

## 2012-10-02 MED ORDER — HYDROCHLOROTHIAZIDE 25 MG PO TABS: 25.0000 mg | ORAL_TABLET | Freq: Every day | ORAL | Status: DC

## 2012-10-04 ENCOUNTER — Telehealth: Payer: Self-pay | Admitting: Internal Medicine

## 2012-10-04 NOTE — Telephone Encounter (Signed)
Consult notes sent to [facility] to be put into [facility].

## 2012-12-17 ENCOUNTER — Telehealth: Payer: Self-pay | Admitting: Internal Medicine

## 2012-12-17 NOTE — Telephone Encounter (Signed)
Please refill due to death in family

## 2012-12-26 ENCOUNTER — Other Ambulatory Visit: Payer: Self-pay | Admitting: *Deleted

## 2012-12-26 DIAGNOSIS — E876 Hypokalemia: Secondary | ICD-10-CM

## 2012-12-26 DIAGNOSIS — I10 Essential (primary) hypertension: Secondary | ICD-10-CM

## 2012-12-26 MED ORDER — POTASSIUM CHLORIDE CRYS ER 20 MEQ PO TBCR: 20.0000 meq | EXTENDED_RELEASE_TABLET | Freq: Every day | ORAL | Status: DC

## 2013-01-23 ENCOUNTER — Ambulatory Visit (INDEPENDENT_AMBULATORY_CARE_PROVIDER_SITE_OTHER): Payer: PRIVATE HEALTH INSURANCE

## 2013-01-23 DIAGNOSIS — J309 Allergic rhinitis, unspecified: Secondary | ICD-10-CM

## 2013-01-29 ENCOUNTER — Telehealth: Payer: Self-pay | Admitting: Cardiovascular Disease

## 2013-01-29 NOTE — Telephone Encounter (Signed)
C/o dizziness with tilting head back and with position changes. Pt has allergy problems and has app with pcp 01/31/13, pt feels warm and has cheek pressure. Pt didn't take hctz and k+ today, didn't lessen symptoms. Told pt to go back on meds tomorrow, go to walk in clinic if thursday app not soon enough with pcp, told pt med not likely the cause of symptoms, told him to call with further questions or concerns, pt agreed to plan.

## 2013-01-29 NOTE — Telephone Encounter (Signed)
Pt is having issues with sleeping and some issues with dizziness for about 4-5 days and he wants to discuss this today

## 2013-01-31 ENCOUNTER — Ambulatory Visit (INDEPENDENT_AMBULATORY_CARE_PROVIDER_SITE_OTHER): Payer: PRIVATE HEALTH INSURANCE | Admitting: Internal Medicine

## 2013-01-31 VITALS — BP 128/94 | Temp 98.9°F | Wt 203.0 lb

## 2013-01-31 DIAGNOSIS — J019 Acute sinusitis, unspecified: Secondary | ICD-10-CM

## 2013-01-31 DIAGNOSIS — H6693 Otitis media, unspecified, bilateral: Secondary | ICD-10-CM

## 2013-01-31 DIAGNOSIS — I10 Essential (primary) hypertension: Secondary | ICD-10-CM

## 2013-01-31 DIAGNOSIS — H6122 Impacted cerumen, left ear: Secondary | ICD-10-CM

## 2013-01-31 DIAGNOSIS — H8110 Benign paroxysmal vertigo, unspecified ear: Secondary | ICD-10-CM

## 2013-01-31 DIAGNOSIS — H6690 Otitis media, unspecified, unspecified ear: Secondary | ICD-10-CM

## 2013-02-01 ENCOUNTER — Encounter: Payer: Self-pay | Admitting: Internal Medicine

## 2013-02-01 NOTE — Progress Notes (Signed)
  Subjective:    Patient ID: Ricky Sharp, male    DOB: November 09, 1961, 52 y.o.   MRN: [id number]  HPI 52 year old white male with history of anxiety, GE reflux, hypertension treated with HCTZ and potassium supplementation, allergic rhinitis in today complaining of vertigo with positional change for several days. Has begun to have discolored nasal drainage. Thought he might be having a reaction to diuretic. No fever or shaking chills. Chronically fatigued. Has stressful job. He and wife are in marital counseling. Mother-in-law died recently.    Review of Systems     Objective:   Physical Exam impacted cerumen left external ear removed with curette. Both TMs dull. Pharynx slightly injected. Neck is supple without JVD thyromegaly or carotid bruits. Chest clear to auscultation. Cardiac exam regular rate and rhythm normal S1 and S2. Cranial nerves II through XII are grossly intact. No nystagmus. PERRLA. Fundi are benign. Deep tendon reflexes 2+ and symmetrical. Cerebellar finger to nose testing within normal limits. Gait is normal. Complained of dizziness when sitting up from a lying position.        Assessment & Plan:  Impacted cerumen left ear-removed with curette  Sinusitis  Benign positional vertigo  Hypertension  Anxiety  GE reflux  Plan: May take over-the-counter Bonine for vertigo. Levaquin 500 milligrams daily for 7 days. Call if not better in 7-10 days or sooner if worse. Explained to him  vertigo could recur from time to time.  30 minutes spent with patient

## 2013-02-01 NOTE — Patient Instructions (Addendum)
Take antibiotic for sinus infection. May take Bonine over-the-counter if dizziness persists. Continue HCTZ and potassium supplement. Large amount of wax is been removed from left external ear canal. Return if not better in 7-10 days

## 2013-02-19 ENCOUNTER — Telehealth: Payer: Self-pay | Admitting: Internal Medicine

## 2013-02-19 NOTE — Telephone Encounter (Signed)
This is done by ENT. I am not trained to do this. Do we need to get him ENT appt?

## 2013-02-20 ENCOUNTER — Telehealth: Payer: Self-pay

## 2013-02-20 NOTE — Telephone Encounter (Signed)
Records faxed to [facility] ENT before they will schedule him an appointment

## 2013-02-20 NOTE — Telephone Encounter (Signed)
111

## 2013-04-22 ENCOUNTER — Ambulatory Visit (INDEPENDENT_AMBULATORY_CARE_PROVIDER_SITE_OTHER): Payer: Commercial Managed Care - PPO | Admitting: Internal Medicine

## 2013-04-22 ENCOUNTER — Encounter: Payer: Self-pay | Admitting: Internal Medicine

## 2013-04-22 VITALS — BP 160/80 | HR 96 | Ht 70.5 in | Wt 207.8 lb

## 2013-04-22 DIAGNOSIS — J309 Allergic rhinitis, unspecified: Secondary | ICD-10-CM

## 2013-04-22 DIAGNOSIS — J302 Other seasonal allergic rhinitis: Secondary | ICD-10-CM

## 2013-04-22 DIAGNOSIS — J3089 Other allergic rhinitis: Secondary | ICD-10-CM

## 2013-04-22 DIAGNOSIS — J4520 Mild intermittent asthma, uncomplicated: Secondary | ICD-10-CM

## 2013-04-22 DIAGNOSIS — J45909 Unspecified asthma, uncomplicated: Secondary | ICD-10-CM

## 2013-04-22 MED ORDER — OLOPATADINE HCL 0.1 % OP SOLN: 1.0000 [drp] | Freq: Two times a day (BID) | OPHTHALMIC | Status: DC

## 2013-04-22 MED ORDER — EPINEPHRINE 0.15 MG/0.3ML IJ SOAJ: INTRAMUSCULAR | Status: DC

## 2013-04-22 MED ORDER — AZELASTINE-FLUTICASONE 137-50 MCG/ACT NA SUSP: 1.0000 | Freq: Every day | NASAL | Status: DC

## 2013-04-22 NOTE — Progress Notes (Signed)
Subjective:    Patient ID: Ricky Sharp, male    DOB: [birth year], 52 y.o.   MRN: [id number]  HPI 04/14/11-50 yoM never smoker followecd for asthma and allergic rhinitis, complicated by GERD.  Last here 11/25/10 for acute visit. Reports no particular problems since then except this year Spring pollen season has been worse. Eyes burn and water, nasal congestion, choking sensation. Denies chest tightness or wheeze. Mucus is yellow green with no fever or obvious infection. Has used saline rinse and antihistamines every few days. . Not using the nasal steroid or Astepro. Says with hx of reflux, he feels better if he minimizes meds, especially inhaled steroids.  Continues allergy vaccine at 1:10 GO.   04/19/12- 50 yoM never smoker followecd for asthma and allergic rhinitis, complicated by GERD Seasonal flare up-watery eyes, slight itch, and sneezing with drainage; still on vaccine and doing well. Perennial and seasonal rhinitis. Vaccine is sufficient. He rarely needs antihistamines but was interested we discussed nasal sprays. No wheezing.  04/22/13- 50 yoM never smoker followed for asthma and allergic rhinitis, complicated by GERD FOLLOWS FOR: remains on allergy vaccine 1:10 GO and doing well.  Ues Nasalcrom and saline rinse. Avoids antihistamines as much as possible. Only wheezes when he has an infection. Medications reviewed.  ROS-see HPI Constitutional:   No-   weight loss, night sweats, fevers, chills, fatigue, lassitude. HEENT:   No-  headaches, difficulty swallowing, tooth/dental problems, sore throat,       + sneezing, itching, ear ache, nasal congestion, post nasal drip,  CV:  No-   chest pain, orthopnea, PND, swelling in lower extremities, anasarca, dizziness, palpitations Resp: No-   shortness of breath with exertion or at rest.              No-   productive cough,  No non-productive cough,  No- coughing up of blood.              No-   change in color of mucus.  No- wheezing.   Skin:  No-   rash or lesions. GI:  No-   heartburn, indigestion, abdominal pain, nausea, vomiting, GU:  MS:  No-   joint pain or swelling.   Neuro-     nothing unusual Psych:  No- change in mood or affect. No depression or anxiety.  No memory loss.  OBJ- Physical Exam General- Alert, Oriented, Affect-appropriate, Distress- none acute Skin- rash-none, lesions- none, excoriation- none Lymphadenopathy- none Head- atraumatic            Eyes- Gross vision intact, PERRLA, conjunctivae and secretions clear.             Ears- Hearing, canals-normal            Nose- Clear, no-Septal dev, mucus, polyps, erosion, perforation             Throat- Mallampati III-IV , mucosa clear , drainage- none, tonsils- atrophic Neck- flexible , trachea midline, no stridor , thyroid nl, carotid no bruit Chest - symmetrical excursion , unlabored           Heart/CV- RRR , no murmur , no gallop  , no rub, nl s1 s2                           - JVD- none , edema- none, stasis changes- none, varices- none           Lung- clear to P&A, wheeze- none, cough- none ,  dullness-none, rub- none           Chest wall-  Abd-  Br/ Gen/ Rectal- Not done, not indicated Extrem- cyanosis- none, clubbing, none, atrophy- none, strength- nl Neuro- grossly intact to observation

## 2013-04-22 NOTE — Patient Instructions (Addendum)
We can continue allergy vaccine 1:10 GO  Refill scripts for Patanol, Epipen and Dymista  Please call as needed

## 2013-05-03 NOTE — Assessment & Plan Note (Signed)
He is satisfied that allergy vaccine helps him. Nasalcrom is also useful. He does have Dymista, needed less often. Plan-redo EpiPen, renew Patanol for allergic conjunctivitis.

## 2013-05-03 NOTE — Assessment & Plan Note (Signed)
Good control. No extra intervention needed.

## 2013-07-03 ENCOUNTER — Other Ambulatory Visit: Payer: Self-pay

## 2013-07-03 DIAGNOSIS — I10 Essential (primary) hypertension: Secondary | ICD-10-CM

## 2013-07-03 MED ORDER — HYDROCHLOROTHIAZIDE 25 MG PO TABS: 25.0000 mg | ORAL_TABLET | Freq: Every day | ORAL | Status: DC

## 2013-07-03 NOTE — Telephone Encounter (Signed)
OK to refill per Ricky Sharp 

## 2013-07-05 ENCOUNTER — Ambulatory Visit (INDEPENDENT_AMBULATORY_CARE_PROVIDER_SITE_OTHER): Payer: Commercial Managed Care - PPO

## 2013-07-05 DIAGNOSIS — J309 Allergic rhinitis, unspecified: Secondary | ICD-10-CM

## 2013-09-11 ENCOUNTER — Encounter: Payer: Self-pay | Admitting: Cardiovascular Disease

## 2013-09-11 ENCOUNTER — Ambulatory Visit (INDEPENDENT_AMBULATORY_CARE_PROVIDER_SITE_OTHER): Payer: Commercial Managed Care - PPO | Admitting: Cardiovascular Disease

## 2013-09-11 VITALS — BP 140/100 | HR 73 | Ht 70.5 in | Wt 208.0 lb

## 2013-09-11 DIAGNOSIS — I10 Essential (primary) hypertension: Secondary | ICD-10-CM

## 2013-09-11 NOTE — Progress Notes (Signed)
Ricky Sharp Date of Birth  [birth year] [facility]     [place] [facility]  [address]    [address]   [address] [place], [place]  [address]    [place], [place]  [address] [phone]  Fax  [phone]  [phone]  Fax [phone]  Problem List: 1. Hypertension 2. Anxiety - stress related 3. GERD  History of Present Illness:  Patient is a 52 year old gentleman with a history of hypertension. He's tried several medications in the past that have not completely controlled his blood pressure.    - He has tried Losartan but had some dizziness / dry mouth.    - tried bystolic 5 mg  - felt poorly, persistant head ache, diarrhea, mild respiratory distress.  Tried for a week   He has also tried some antianxiety meds but had various reactions.   He has multiple family and life stresses.    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  He is also having some family health issues that require him to spend lots of time with them.  He has felt well on his HCTZ and potassium since his last visit.  He has not been exercising as much as he would like.  Oct. 1, 2014:   Ricky Sharp is a 52 yo with hx of HTN.  He has not been exercising.  He has gained 10 lbs.  He has tried Losartan but it caused vertigo.  He tried beta blockers but had severe fatigue and slow HR.   He does not want to start any new meds.  He knows that his weight is up and that is BP has been elevated.   Current Outpatient Prescriptions on File Prior to Visit  Medication Sig Dispense Refill  . abatacept (ORENCIA) 250 MG injection TABLET FORM  250mg  as directed       . Azelastine-Fluticasone (DYMISTA) 137-50 MCG/ACT SUSP Place 1-2 sprays into the nose at bedtime.  23 g  prn  . cromolyn (NASALCROM) 5.2 MG/ACT nasal spray Place 1 spray into the nose 4 (four) times daily.        Marland Kitchen dexlansoprazole (DEXILANT) 60 MG capsule Take 60 mg by mouth daily as needed.        Marland Kitchen EPINEPHrine (EPIPEN JR) 0.15 MG/0.3 ML injection Inject into thigh if needed for severe allergic reavtion  1 each  prn  . hydrochlorothiazide (HYDRODIURIL) 25 MG tablet Take 1 tablet (25 mg total) by mouth daily.  30 tablet  3  . ibuprofen (ADVIL,MOTRIN) 200 MG tablet Take 200 mg by mouth every 6 (six) hours as needed.        Marland Kitchen olopatadine (PATANOL) 0.1 % ophthalmic solution Place 1 drop into both eyes 2 (two) times daily. As needed  5 mL  prn  . potassium chloride SA (K-DUR,KLOR-CON) 20 MEQ tablet Take 40 mEq by mouth daily.       No current facility-administered medications on file prior to visit.    Allergies  Allergen Reactions  . Cozaar     Vertigo    Past Medical History  Diagnosis Date  . Asthma   . Allergic rhinitis   . GERD (gastroesophageal reflux disease)     Past Surgical History  Procedure Laterality Date  . Cholecystectomy    . Tonsillectomy    . Osteochondroma excision      History  Smoking status  . Never Smoker  Smokeless tobacco  . Not on file    History  Alcohol Use No    History reviewed. No pertinent family history.  Reviw of Systems:  Reviewed in the HPI.  All other systems are negative.  Physical Exam: Blood pressure 140/100, pulse 73, height 5' 10.5" (1.791 m). General: Well developed, well nourished, in no acute distress.  Head: Normocephalic, atraumatic, sclera non-icteric, mucus membranes are moist,   Neck: Supple. Carotids are 2 + without bruits. No JVD  Lungs: Clear bilaterally to auscultation.  Heart: regular rate.  normal  S1 S2. No murmurs, gallops or rubs.  Abdomen: Soft, non-tender, non-distended with normal bowel sounds. No hepatomegaly. No rebound/guarding. No masses.  Msk:  Strength and tone are normal  Extremities: No clubbing or cyanosis. No edema.  Distal pedal pulses are 2+ and equal bilaterally.  Neuro: Alert and oriented X 3. Moves all extremities spontaneously.  Psych:  Responds to questions  appropriately with a normal affect.  ECG:    Assessment / Plan:

## 2013-09-11 NOTE — Patient Instructions (Addendum)
REDUCE HIGH SODIUM FOODS LIKE CANNED SOUP, GRAVY, SAUCES, READY PREPARED FOODS LIKE FROZEN FOODS; LEAN CUISINE, LASAGNA. BACON, SAUSAGE, LUNCH MEAT, FAST FOODS, HOT DOGS, CHIPS, PIZZA, CHINESE FOOD, SOY SAUCE, STORE BOUGHT FRIED CHICKEN= [place] FRIED CHICKEN/ BOJANGLES.   Your physician wants you to follow-up in: 6 months You will receive a reminder letter in the mail two months in advance. If you don't receive a letter, please call our office to schedule the follow-up appointment.

## 2013-09-11 NOTE — Assessment & Plan Note (Signed)
Ricky Sharp presents today for followup of his hypertension. Since I last saw him, he has gained 10 pounds and his blood pressure has been elevated. He's been under lots of stress with family issues and work issues. He has been eating out a lot at night and also has not been exercising.  He did not want to start any new medications at this point. He developed vertigo and response to losartan. He's tried beta blockers in the past but did not tolerate them.  We talked about possibly starting amlodipine but at this point he would like to work on a better diet and exercise program. I seen again in 6 months with an office visit and basic metabolic profile.

## 2013-10-10 ENCOUNTER — Other Ambulatory Visit: Payer: Self-pay | Admitting: Cardiovascular Disease

## 2013-11-21 ENCOUNTER — Ambulatory Visit (INDEPENDENT_AMBULATORY_CARE_PROVIDER_SITE_OTHER): Payer: Commercial Managed Care - PPO

## 2013-11-21 DIAGNOSIS — J309 Allergic rhinitis, unspecified: Secondary | ICD-10-CM

## 2013-12-10 ENCOUNTER — Other Ambulatory Visit: Payer: Commercial Managed Care - PPO

## 2013-12-20 ENCOUNTER — Ambulatory Visit: Payer: Commercial Managed Care - PPO | Admitting: Internal Medicine

## 2013-12-24 ENCOUNTER — Ambulatory Visit (INDEPENDENT_AMBULATORY_CARE_PROVIDER_SITE_OTHER): Payer: Commercial Managed Care - PPO | Admitting: Internal Medicine

## 2013-12-24 ENCOUNTER — Encounter: Payer: Self-pay | Admitting: Internal Medicine

## 2013-12-24 VITALS — BP 146/98 | HR 76 | Temp 98.1°F | Wt 208.0 lb

## 2013-12-24 DIAGNOSIS — R5381 Other malaise: Secondary | ICD-10-CM

## 2013-12-24 DIAGNOSIS — E039 Hypothyroidism, unspecified: Secondary | ICD-10-CM

## 2013-12-24 DIAGNOSIS — R5383 Other fatigue: Secondary | ICD-10-CM

## 2013-12-24 DIAGNOSIS — I10 Essential (primary) hypertension: Secondary | ICD-10-CM

## 2013-12-24 LAB — POCT URINALYSIS DIPSTICK
Bilirubin, UA: NEGATIVE
Glucose, UA: NEGATIVE
KETONES UA: NEGATIVE
Leukocytes, UA: NEGATIVE
Nitrite, UA: NEGATIVE
PROTEIN UA: NEGATIVE
RBC UA: NEGATIVE
UROBILINOGEN UA: NEGATIVE
pH, UA: 5.5

## 2013-12-24 LAB — CBC WITH DIFFERENTIAL/PLATELET
Basophils Absolute: 0.1 10*3/uL (ref 0.0–0.1)
Basophils Relative: 1 % (ref 0–1)
Eosinophils Absolute: 0.2 10*3/uL (ref 0.0–0.7)
Eosinophils Relative: 3 % (ref 0–5)
HCT: 47.1 % (ref 39.0–52.0)
Hemoglobin: 16.5 g/dL (ref 13.0–17.0)
LYMPHS ABS: 1.9 10*3/uL (ref 0.7–4.0)
LYMPHS PCT: 32 % (ref 12–46)
MCH: 30.3 pg (ref 26.0–34.0)
MCHC: 35 g/dL (ref 30.0–36.0)
MCV: 86.6 fL (ref 78.0–100.0)
Monocytes Absolute: 0.6 10*3/uL (ref 0.1–1.0)
Monocytes Relative: 11 % (ref 3–12)
NEUTROS ABS: 3.1 10*3/uL (ref 1.7–7.7)
NEUTROS PCT: 53 % (ref 43–77)
PLATELETS: 300 10*3/uL (ref 150–400)
RBC: 5.44 MIL/uL (ref 4.22–5.81)
RDW: 14.1 % (ref 11.5–15.5)
WBC: 5.9 10*3/uL (ref 4.0–10.5)

## 2013-12-24 LAB — COMPREHENSIVE METABOLIC PANEL
ALT: 56 U/L — AB (ref 0–53)
AST: 31 U/L (ref 0–37)
Albumin: 4.5 g/dL (ref 3.5–5.2)
Alkaline Phosphatase: 86 U/L (ref 39–117)
BUN: 17 mg/dL (ref 6–23)
CALCIUM: 10 mg/dL (ref 8.4–10.5)
CHLORIDE: 98 meq/L (ref 96–112)
CO2: 30 meq/L (ref 19–32)
Creat: 0.91 mg/dL (ref 0.50–1.35)
Glucose, Bld: 75 mg/dL (ref 70–99)
POTASSIUM: 3.9 meq/L (ref 3.5–5.3)
SODIUM: 138 meq/L (ref 135–145)
TOTAL PROTEIN: 7.1 g/dL (ref 6.0–8.3)
Total Bilirubin: 0.7 mg/dL (ref 0.3–1.2)

## 2013-12-24 LAB — SEDIMENTATION RATE: Sed Rate: 1 mm/hr (ref 0–16)

## 2013-12-24 MED ORDER — CLOTRIMAZOLE-BETAMETHASONE 1-0.05 % EX CREA: 1.0000 "application " | TOPICAL_CREAM | Freq: Two times a day (BID) | CUTANEOUS | Status: DC

## 2013-12-24 NOTE — Patient Instructions (Addendum)
EKG shows no acute changes. Lab work is pending. Prescribed Lotrisone cream for perianal dermatitis.  Addendum: Start Synthroid 0.05 mg daily for hypothyroidism and followup in 6 weeks

## 2013-12-25 LAB — TSH: TSH: 4.585 u[IU]/mL — AB (ref 0.350–4.500)

## 2013-12-25 LAB — T4, FREE: Free T4: 1.02 ng/dL (ref 0.80–1.80)

## 2013-12-25 LAB — HEMOGLOBIN A1C
HEMOGLOBIN A1C: 5.6 % (ref <5.7)
Mean Plasma Glucose: 114 mg/dL (ref <117)

## 2013-12-26 ENCOUNTER — Other Ambulatory Visit: Payer: Self-pay

## 2013-12-26 MED ORDER — LEVOTHYROXINE SODIUM 50 MCG PO TABS: 50.0000 ug | ORAL_TABLET | Freq: Every day | ORAL | Status: DC

## 2014-01-15 ENCOUNTER — Other Ambulatory Visit: Payer: Self-pay | Admitting: Cardiovascular Disease

## 2014-02-20 ENCOUNTER — Other Ambulatory Visit: Payer: Commercial Managed Care - PPO | Admitting: Internal Medicine

## 2014-02-20 DIAGNOSIS — E039 Hypothyroidism, unspecified: Secondary | ICD-10-CM

## 2014-02-21 ENCOUNTER — Ambulatory Visit (INDEPENDENT_AMBULATORY_CARE_PROVIDER_SITE_OTHER): Payer: Commercial Managed Care - PPO | Admitting: Internal Medicine

## 2014-02-21 ENCOUNTER — Encounter: Payer: Self-pay | Admitting: Internal Medicine

## 2014-02-21 VITALS — BP 134/86 | HR 72 | Temp 97.7°F | Wt 208.0 lb

## 2014-02-21 DIAGNOSIS — E039 Hypothyroidism, unspecified: Secondary | ICD-10-CM | POA: Insufficient documentation

## 2014-02-21 LAB — TSH: TSH: 2.416 u[IU]/mL (ref 0.350–4.500)

## 2014-02-21 MED ORDER — LEVOTHYROXINE SODIUM 75 MCG PO TABS: 75.0000 ug | ORAL_TABLET | Freq: Every day | ORAL | Status: DC

## 2014-02-21 NOTE — Progress Notes (Signed)
   Subjective:    Patient ID: Ricky Sharp, male    DOB: [birth year], 53 y.o.   MRN: [id number]  HPI For followup of recent diagnosis of hypothyroidism. TSH was elevated several weeks ago when he came in complaining of fatigue. He was started on Synthroid 0.05 mg daily. Fatigue has improved somewhat. However there number of stressors including home life and work that can aggravate the fatigue.    Review of Systems     Objective:   Physical Exam Thyroid not enlarged. TSH improved on Synthroid 0.05 mg daily       Assessment & Plan:  Hypothyroidism  Fatigue  Plan: Increase Synthroid 0.075 mg daily. Recheck with office visit and TSH in 6-8 weeks. Aim is to  get TSH around 1.00

## 2014-02-21 NOTE — Patient Instructions (Addendum)
Increase Synthroid to 0.075 mg daily from 0.05 mg daily. Return in 6-8 weeks for office visit and TSH.

## 2014-02-21 NOTE — Addendum Note (Signed)
Addended by: Brett Canales on: 02/21/2014 05:15 PM   Modules accepted: Orders

## 2014-02-28 ENCOUNTER — Other Ambulatory Visit: Payer: Commercial Managed Care - PPO | Admitting: Internal Medicine

## 2014-03-04 ENCOUNTER — Ambulatory Visit (INDEPENDENT_AMBULATORY_CARE_PROVIDER_SITE_OTHER): Payer: Commercial Managed Care - PPO | Admitting: Cardiovascular Disease

## 2014-03-04 ENCOUNTER — Encounter: Payer: Self-pay | Admitting: Cardiovascular Disease

## 2014-03-04 VITALS — BP 136/90 | HR 90 | Ht 70.5 in | Wt 208.0 lb

## 2014-03-04 DIAGNOSIS — I10 Essential (primary) hypertension: Secondary | ICD-10-CM

## 2014-03-04 MED ORDER — LOSARTAN POTASSIUM 25 MG PO TABS: 25.0000 mg | ORAL_TABLET | Freq: Every day | ORAL | Status: DC

## 2014-03-04 NOTE — Patient Instructions (Signed)
Your physician has recommended you make the following change in your medication:  START LOSARTAN 25 [place] physician recommends that you return for lab work in: [employer] AT PCP   Your physician wants you to follow-up in: 6 MONTHS  You will receive a reminder letter in the mail two months in advance. If you don't receive a letter, please call our office to schedule the follow-up appointment.   REDUCE HIGH SODIUM FOODS LIKE CANNED SOUP, GRAVY, SAUCES, READY PREPARED FOODS LIKE FROZEN FOODS; LEAN CUISINE, LASAGNA. BACON, SAUSAGE, LUNCH MEAT, FAST FOODS, HOT DOGS, CHIPS, PIZZA, CHINESE FOOD, SOY SAUCE, STORE BOUGHT FRIED CHICKEN= [place] FRIED CHICKEN/ BO JANGLES. PICKLES, OLIVES, KETCHUP.

## 2014-03-04 NOTE — Assessment & Plan Note (Signed)
Drezden's  diastolic blood pressure remains mildly elevated. He's on  HCTZ. He admits to eating a little bit of extra salt. We'll add losartan 25 mg a day. He previously had reported a reaction to losartan but in retrospect he thinks it was actually  due to vertigo. I've removed losartan from his allergy list and we will start at 25 mg a day.  To have a followup basic metabolic profile at his medical doctor's office in 1 months.  I will see  him in 6 months for followup visit.

## 2014-03-04 NOTE — Progress Notes (Signed)
Ricky Sharp Date of Birth  [birth year] [place]  [address]    [place]   [place], [place]  [address]    [place], [place]  [address] [phone]  Fax  [phone]  [phone]  Fax [phone]  Problem List: 1. Hypertension 2. Anxiety - stress related 3. GERD  History of Present Illness:  Patient is a 53 year old gentleman with a history of hypertension. He's tried several medications in the past that have not completely controlled his blood pressure.    - He has tried Losartan but had some dizziness / dry mouth.    - tried bystolic 5 mg  - felt poorly, persistant head ache, diarrhea, mild respiratory distress.  Tried for a week   He has also tried some antianxiety meds but had various reactions.   He has multiple family and life stresses.    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  He is also having some family health issues that require him to spend lots of time with them.  He has felt well on his HCTZ and potassium since his last visit.  He has not been exercising as much as he would like.  Oct. 1, 2014:   Ricky Sharp is a 53 yo with hx of HTN.  He has not been exercising.  He has gained 10 lbs.  He has tried Losartan but it caused vertigo.  He tried beta blockers but had severe fatigue and slow HR.   He does not want to start any new meds.  He knows that his weight is up and that is BP has been elevated.   March 04, 2014:  Ricky Sharp has been diagnosed with hypothyroidism since I last saw him.   He is feeling somewhat better.    Diastolic BP has still been a bit high.  Still eating some salt.   BP at home is typically is the  80s - occasionally upper 80s.    Current Outpatient Prescriptions on File Prior to Visit  Medication Sig Dispense Refill  . Azelastine-Fluticasone (DYMISTA) 137-50 MCG/ACT SUSP Place 1-2 sprays into the nose at bedtime.  23 g  prn  .  CARAFATE 1 GM/10ML suspension Take by mouth as directed.       . clotrimazole-betamethasone (LOTRISONE) cream Apply 1 application topically 2 (two) times daily.  30 g  5  . cromolyn (NASALCROM) 5.2 MG/ACT nasal spray Place 1 spray into the nose 4 (four) times daily.        Marland Kitchen dexlansoprazole (DEXILANT) 60 MG capsule Take 60 mg by mouth daily as needed.       Marland Kitchen EPINEPHrine (EPIPEN JR) 0.15 MG/0.3 ML injection Inject into thigh if needed for severe allergic reavtion  1 each  prn  . escitalopram (LEXAPRO) 10 MG tablet Take 5 mg by mouth daily.       . hydrochlorothiazide (HYDRODIURIL) 25 MG tablet TAKE 1 TABLET ONCE DAILY.  30 tablet  1  . ibuprofen (ADVIL,MOTRIN) 200 MG tablet Take 200 mg by mouth every 6 (six) hours as needed.        Marland Kitchen levothyroxine (SYNTHROID, LEVOTHROID) 75 MCG tablet Take 1 tablet (75 mcg total) by mouth daily.  90 tablet  0  . olopatadine (PATANOL) 0.1 % ophthalmic solution Place 1 drop into both eyes 2 (two) times daily. As needed  5 mL  prn  .  potassium chloride SA (K-DUR,KLOR-CON) 20 MEQ tablet Take 40 mEq by mouth daily.       No current facility-administered medications on file prior to visit.    Allergies  Allergen Reactions  . Cozaar     Vertigo    Past Medical History  Diagnosis Date  . Asthma   . Allergic rhinitis   . GERD (gastroesophageal reflux disease)     Past Surgical History  Procedure Laterality Date  . Cholecystectomy    . Tonsillectomy    . Osteochondroma excision      History  Smoking status  . Never Smoker   Smokeless tobacco  . Not on file    History  Alcohol Use No    No family history on file.  Reviw of Systems:  Reviewed in the HPI.  All other systems are negative.  Physical Exam: Blood pressure 136/90, pulse 90, height 5' 10.5" (1.791 m), weight 208 lb (94.348 kg). General: Well developed, well nourished, in no acute distress.  Head: Normocephalic, atraumatic, sclera non-icteric, mucus membranes are moist,   Neck:  Supple. Carotids are 2 + without bruits. No JVD  Lungs: Clear bilaterally to auscultation.  Heart: regular rate.  normal  S1 S2. No murmurs, gallops or rubs.  Abdomen: Soft, non-tender, non-distended with normal bowel sounds. No hepatomegaly. No rebound/guarding. No masses.  Msk:  Strength and tone are normal  Extremities: No clubbing or cyanosis. No edema.  Distal pedal pulses are 2+ and equal bilaterally.  Neuro: Alert and oriented X 3. Moves all extremities spontaneously.  Psych:  Responds to questions appropriately with a normal affect.  ECG:    Assessment / Plan:

## 2014-03-12 ENCOUNTER — Telehealth: Payer: Self-pay | Admitting: Cardiovascular Disease

## 2014-03-12 NOTE — Telephone Encounter (Signed)
Pt states he is having occasional dizziness and slight diarrhea with losartan but it is tolerable. Pt states he is having better BP readings, pt at work and can't remember results but they are better. Pt will call back if he feels the losartan is intolerable.

## 2014-03-12 NOTE — Telephone Encounter (Signed)
New message  Patient was calling with follow up from taking Lasartin.  Makes him a little dizzy, little diarrhea. His BP is much better Overall the med's are working well.

## 2014-04-03 ENCOUNTER — Other Ambulatory Visit: Payer: Commercial Managed Care - PPO | Admitting: Internal Medicine

## 2014-04-03 DIAGNOSIS — E039 Hypothyroidism, unspecified: Secondary | ICD-10-CM

## 2014-04-03 DIAGNOSIS — E876 Hypokalemia: Secondary | ICD-10-CM

## 2014-04-03 LAB — BASIC METABOLIC PANEL
BUN: 14 mg/dL (ref 6–23)
CALCIUM: 9.4 mg/dL (ref 8.4–10.5)
CO2: 27 meq/L (ref 19–32)
Chloride: 99 mEq/L (ref 96–112)
Creat: 0.91 mg/dL (ref 0.50–1.35)
GLUCOSE: 99 mg/dL (ref 70–99)
POTASSIUM: 3.8 meq/L (ref 3.5–5.3)
SODIUM: 139 meq/L (ref 135–145)

## 2014-04-03 LAB — TSH: TSH: 1.36 u[IU]/mL (ref 0.350–4.500)

## 2014-04-04 ENCOUNTER — Encounter: Payer: Self-pay | Admitting: Internal Medicine

## 2014-04-04 ENCOUNTER — Ambulatory Visit (INDEPENDENT_AMBULATORY_CARE_PROVIDER_SITE_OTHER): Payer: Commercial Managed Care - PPO | Admitting: Internal Medicine

## 2014-04-04 VITALS — BP 138/86 | HR 80 | Wt 205.0 lb

## 2014-04-04 DIAGNOSIS — E039 Hypothyroidism, unspecified: Secondary | ICD-10-CM

## 2014-04-04 MED ORDER — LEVOTHYROXINE SODIUM 75 MCG PO TABS: 75.0000 ug | ORAL_TABLET | Freq: Every day | ORAL | Status: DC

## 2014-04-04 NOTE — Patient Instructions (Addendum)
Return in 6 months for office visit and TSH. Continue Synthroid 0.075 mg daily.

## 2014-04-07 NOTE — Progress Notes (Signed)
   Subjective:    Patient ID: Ricky Sharp, male    DOB: February 02, 1961, 53 y.o.   MRN: [id number]  HPI in January he was complaining of extreme fatigue. TSH was abnormal and was found to have primary hypothyroidism. Was started on Synthroid 0.05 mg daily. In March TSH was rechecked and was 2.416. Synthroid was increased to 0.075 mg daily. He's here for recheck on that. Continues to have stress with home life and job. However doesn't feel as fatigued. He is taking judo. He has been able to tell a difference on thyroid replacement therapy in terms of fatigue. TSH drawn April 23 is improved at 1.360 on Synthroid 0.075 mg daily. Told him I would like to see TSH around 1 in we have achieved that point.    Review of Systems     Objective:   Physical Exam  Thyroid not enlarged.      Assessment & Plan:  Hypothyroidism  Plan: TSH now within normal limits on Synthroid 0.075 mg daily. Continue this dose and recheck in 6 months.

## 2014-04-09 NOTE — Telephone Encounter (Signed)
Pts labs look OK.  Dr. Renold Genta will follow

## 2014-04-09 NOTE — Telephone Encounter (Signed)
New message      Results was available last Friday by Dr. Renold Genta.  Can call patient if need be.

## 2014-04-09 NOTE — Telephone Encounter (Signed)
Patient's lab results in epic from visit with PCP; forwarded to Dr. Acie Fredrickson for his knowledge.

## 2014-04-22 ENCOUNTER — Encounter: Payer: Self-pay | Admitting: Internal Medicine

## 2014-04-22 ENCOUNTER — Encounter (INDEPENDENT_AMBULATORY_CARE_PROVIDER_SITE_OTHER): Payer: Self-pay

## 2014-04-22 ENCOUNTER — Ambulatory Visit (INDEPENDENT_AMBULATORY_CARE_PROVIDER_SITE_OTHER): Payer: Commercial Managed Care - PPO | Admitting: Internal Medicine

## 2014-04-22 VITALS — BP 148/82 | HR 71 | Ht 70.5 in | Wt 208.2 lb

## 2014-04-22 DIAGNOSIS — J302 Other seasonal allergic rhinitis: Secondary | ICD-10-CM

## 2014-04-22 DIAGNOSIS — J45909 Unspecified asthma, uncomplicated: Secondary | ICD-10-CM

## 2014-04-22 DIAGNOSIS — J309 Allergic rhinitis, unspecified: Secondary | ICD-10-CM

## 2014-04-22 DIAGNOSIS — J4520 Mild intermittent asthma, uncomplicated: Secondary | ICD-10-CM

## 2014-04-22 DIAGNOSIS — J3089 Other allergic rhinitis: Secondary | ICD-10-CM

## 2014-04-22 NOTE — Progress Notes (Signed)
Subjective:    Patient ID: Ricky Sharp, male    DOB: [birth year], 53 y.o.   MRN: [id number]  HPI 04/14/11-50 yoM never smoker followecd for asthma and allergic rhinitis, complicated by GERD.  Last here 11/25/10 for acute visit. Reports no particular problems since then except this year Spring pollen season has been worse. Eyes burn and water, nasal congestion, choking sensation. Denies chest tightness or wheeze. Mucus is yellow green with no fever or obvious infection. Has used saline rinse and antihistamines every few days. . Not using the nasal steroid or Astepro. Says with hx of reflux, he feels better if he minimizes meds, especially inhaled steroids.  Continues allergy vaccine at 1:10 GO.   04/19/12- 71 yoM never smoker followecd for asthma and allergic rhinitis, complicated by GERD Seasonal flare up-watery eyes, slight itch, and sneezing with drainage; still on vaccine and doing well. Perennial and seasonal rhinitis. Vaccine is sufficient. He rarely needs antihistamines but was interested we discussed nasal sprays. No wheezing.  04/22/13- 17 yoM never smoker followed for asthma and allergic rhinitis, complicated by GERD FOLLOWS FOR: remains on allergy vaccine 1:10 GO and doing well.  Ues Nasalcrom and saline rinse. Avoids antihistamines as much as possible. Only wheezes when he has an infection. Medications reviewed.  04/22/14- 38 yoM never smoker followed for asthma and allergic rhinitis, complicated by GERD FOLLOWS FOR: had flare up when trees were blooming-PND and watery eyes.  Still on allergy vaccine 1:10 GO Satisfied w/ care. Used Dymista, Nasalcrom during peak season in April. Uses Patanol occasionally.  ROS-see HPI Constitutional:   No-   weight loss, night sweats, fevers, chills, fatigue, lassitude. HEENT:   No-  headaches, difficulty swallowing, tooth/dental problems, sore throat,     No-sneezing, itching, ear ache, +nasal congestion, post nasal drip,  CV:  No-   chest pain,  orthopnea, PND, swelling in lower extremities, anasarca, dizziness, palpitations Resp: No-   shortness of breath with exertion or at rest.              No-   productive cough,  No non-productive cough,  No- coughing up of blood.              No-   change in color of mucus.  No- wheezing.   Skin: No-   rash or lesions. GI:  No-   heartburn, indigestion, abdominal pain, nausea, vomiting, GU:  MS:  No-   joint pain or swelling.   Neuro-     nothing unusual Psych:  No- change in mood or affect. No depression or anxiety.  No memory loss.  OBJ- Physical Exam General- Alert, Oriented, Affect-appropriate, Distress- none acute Skin- rash-none, lesions- none, excoriation- none Lymphadenopathy- none Head- atraumatic            Eyes- Gross vision intact, PERRLA, conjunctivae and secretions clear.             Ears- Hearing, canals-normal            Nose- Clear, no-Septal dev, mucus, polyps, erosion, perforation             Throat- Mallampati III-IV , mucosa clear , drainage- none, tonsils- atrophic Neck- flexible , trachea midline, no stridor , thyroid nl, carotid no bruit Chest - symmetrical excursion , unlabored           Heart/CV- RRR , no murmur , no gallop  , no rub, nl s1 s2                           -  JVD- none , edema- none, stasis changes- none, varices- none           Lung- clear to P&A, wheeze- none, cough- none , dullness-none, rub- none           Chest wall-  Abd-  Br/ Gen/ Rectal- Not done, not indicated Extrem- cyanosis- none, clubbing, none, atrophy- none, strength- nl Neuro- grossly intact to observation

## 2014-04-22 NOTE — Patient Instructions (Signed)
We can continue allergy vaccine 1:10 GO  Ok to continue Dymista, Nasacort while needed  Please call if we can help

## 2014-04-22 NOTE — Assessment & Plan Note (Signed)
Well controlled- credits allergy vaccine. No concerns. Discussed meds.

## 2014-04-22 NOTE — Assessment & Plan Note (Signed)
Well controlled 

## 2014-04-24 ENCOUNTER — Ambulatory Visit (INDEPENDENT_AMBULATORY_CARE_PROVIDER_SITE_OTHER): Payer: Commercial Managed Care - PPO

## 2014-04-24 DIAGNOSIS — J309 Allergic rhinitis, unspecified: Secondary | ICD-10-CM

## 2014-04-29 ENCOUNTER — Other Ambulatory Visit: Payer: Self-pay | Admitting: Cardiovascular Disease

## 2014-06-15 ENCOUNTER — Other Ambulatory Visit: Payer: Self-pay | Admitting: Internal Medicine

## 2014-06-18 ENCOUNTER — Encounter: Payer: Self-pay | Admitting: Internal Medicine

## 2014-06-18 NOTE — Progress Notes (Signed)
   Subjective:    Patient ID: Ricky Sharp, male    DOB: 1961/01/10, 53 y.o.   MRN: [id number]  HPI 53 year old  White male with history of hypertension treated with Cozaar and diuretic, history of allergic rhinitis and anxiety. History of asthma. History of GE reflux. Has felt very fatigued lately. Has not had any fever or chills. No flulike illness a respiratory infection. No headache or weight loss.    Review of Systems     Objective:   Physical Exam Skin warm and dry. Nodes none. TMs and pharynx are clear. Neck is supple without thyromegaly. Chest: clear to auscultation. Cardiac exam: regular rate and rhythm normal S1 and S2. Abdominal exam: No hepatosplenomegaly masses or tenderness. Extremities: without deformity or edema. Neuro: no focal deficits. EKG is within normal limits. He has some perianal dermatitis        Assessment & Plan:  Extreme fatigue  Hypertension treated with diuretic  History of anxiety  History of asthma  Allergic rhinitis  History of GE reflux  Perianal dermatitis-prescribe Lotrisone cream to use twice daily  Plan: Check lab work including thyroid functions and advise further.  25 minutes spent with patient  Addendum: TSH elevated at 4.585. Start Synthroid 0.05 mg daily and followup in 6 weeks.

## 2014-07-09 ENCOUNTER — Telehealth: Payer: Self-pay | Admitting: Cardiovascular Disease

## 2014-07-09 NOTE — Telephone Encounter (Signed)
Clarified with Dr. Acie Fredrickson, who is in the office, that patient was put back on Losartan 4/1 but since that time states he cannot tolerate the diarrhea and the difficulty sleeping.  I advised patient that per Dr. Acie Fredrickson he may d/c the Losartan and to monitor BP.  I advised patient to bring BP log to next ov which is scheduled for 10/1 or to call before if he notices increase in BP.  Patient verbalized understanding and agreement.

## 2014-07-09 NOTE — Telephone Encounter (Signed)
Patient currently taking Losartan 25 mg daily since 03/12/14; thought in past it caused dizziness but in retrospect thought this was due to vertigo.

## 2014-07-09 NOTE — Telephone Encounter (Signed)
New message     Pt do not want to continue to take lorsartan----the side effects are terrible (diarrhea, dizziness, fatigue, and can't sleep at night).  Is there something else he can take?

## 2014-07-09 NOTE — Telephone Encounter (Signed)
Continue current medications if he thinks that he's tolerating the Losartan

## 2014-09-11 ENCOUNTER — Encounter: Payer: Self-pay | Admitting: Cardiovascular Disease

## 2014-09-11 ENCOUNTER — Ambulatory Visit (INDEPENDENT_AMBULATORY_CARE_PROVIDER_SITE_OTHER): Payer: Commercial Managed Care - PPO | Admitting: Cardiovascular Disease

## 2014-09-11 VITALS — BP 132/88 | HR 69 | Ht 70.5 in | Wt 201.0 lb

## 2014-09-11 DIAGNOSIS — Z13220 Encounter for screening for lipoid disorders: Secondary | ICD-10-CM

## 2014-09-11 DIAGNOSIS — I10 Essential (primary) hypertension: Secondary | ICD-10-CM

## 2014-09-11 LAB — BASIC METABOLIC PANEL
BUN: 17 mg/dL (ref 6–23)
CALCIUM: 9.3 mg/dL (ref 8.4–10.5)
CO2: 29 meq/L (ref 19–32)
CREATININE: 1.2 mg/dL (ref 0.4–1.5)
Chloride: 103 mEq/L (ref 96–112)
GFR: 69.57 mL/min (ref >60.00)
Glucose, Bld: 92 mg/dL (ref 70–99)
Potassium: 3.4 mEq/L — ABNORMAL LOW (ref 3.5–5.1)
Sodium: 140 mEq/L (ref 135–145)

## 2014-09-11 LAB — HEPATIC FUNCTION PANEL
ALBUMIN: 4.5 g/dL (ref 3.5–5.2)
ALT: 39 U/L (ref 0–53)
AST: 30 U/L (ref 0–37)
Alkaline Phosphatase: 88 U/L (ref 39–117)
BILIRUBIN DIRECT: 0.1 mg/dL (ref 0.0–0.3)
TOTAL PROTEIN: 7.7 g/dL (ref 6.0–8.3)
Total Bilirubin: 1 mg/dL (ref 0.2–1.2)

## 2014-09-11 LAB — LIPID PANEL
CHOL/HDL RATIO: 6
Cholesterol: 172 mg/dL (ref 0–200)
HDL: 28.9 mg/dL — AB (ref >39.00)
LDL CALC: 108 mg/dL — AB (ref 0–99)
NonHDL: 143.1
TRIGLYCERIDES: 178 mg/dL — AB (ref 0.0–149.0)
VLDL: 35.6 mg/dL (ref 0.0–40.0)

## 2014-09-11 NOTE — Assessment & Plan Note (Signed)
Doing well.  Plans per Dr. Renold Genta

## 2014-09-11 NOTE — Assessment & Plan Note (Signed)
Ricky Sharp is doing well. Is not having episodes of chest pain or shortness bed. His blood pressure is well-controlled on the current dose of HCTZ. We'll check basic metabolic profile, lipid, liver enzymes today. I'll see him again in one year.

## 2014-09-11 NOTE — Progress Notes (Signed)
Ricky Sharp Date of Birth  08-12-1961 Ricky Sharp  [address]    [place]   [place], [place]  [address]    [place], [place]  [address] [phone]  Fax  [phone]  [phone]  Fax [phone]  Problem List: 1. Hypertension 2. Anxiety - stress related 3. GERD  History of Present Illness:  Patient is a 53 year old gentleman with a history of hypertension. He's tried several medications in the past that have not completely controlled his blood pressure.    - He has tried Losartan but had some dizziness / dry mouth.    - tried bystolic 5 mg  - felt poorly, persistant head ache, diarrhea, mild respiratory distress.  Tried for a week   He has also tried some antianxiety meds but had various reactions.   He has multiple family and life stresses.    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  He is also having some family health issues that require him to spend lots of time with them.  He has felt well on his HCTZ and potassium since his last visit.  He has not been exercising as much as he would like.  Oct. 1, 2014:   Ricky Sharp is a 53 yo with hx of HTN.  He has not been exercising.  He has gained 10 lbs.  He has tried Losartan but it caused vertigo.  He tried beta blockers but had severe fatigue and slow HR.   He does not want to start any new meds.  He knows that his weight is up and that is BP has been elevated.   March 04, 2014:  Ricky Sharp has been diagnosed with hypothyroidism since I last saw him.   He is feeling somewhat better.    Diastolic BP has still been a bit high.  Still eating some salt.   BP at home is typically is the  80s - occasionally upper 80s.    Oct. 1, 2015:  Ricky Sharp is now off the Losartan - was having lots of fatigue and diarrhea.   Symptoms are better.  On HCTZ and blood pressures are well controlled. Rides his bike  on occasion. He also is taking Judo  . Not  Having any episodes of chest pain BP has been ok    Current Outpatient Prescriptions on File Prior to Visit  Medication Sig Dispense Refill  . CARAFATE 1 GM/10ML suspension Take by mouth as directed.       . clotrimazole-betamethasone (LOTRISONE) cream Apply 1 application topically 2 (two) times daily.  30 g  5  . cromolyn (NASALCROM) 5.2 MG/ACT nasal spray Place 1 spray into the nose 4 (four) times daily.        Marland Kitchen dexlansoprazole (DEXILANT) 60 MG capsule Take 60 mg by mouth daily as needed.       Marland Kitchen DYMISTA 137-50 MCG/ACT SUSP USE 1-2 SPRAYS IN EACH NOSTRIL AT BEDTIME AS DIRECTED  23 g  PRN  . EPINEPHrine (EPIPEN JR) 0.15 MG/0.3 ML injection Inject into thigh if needed for severe allergic reavtion  1 each  prn  . escitalopram (LEXAPRO) 10 MG tablet Take 5 mg by mouth daily.       . hydrochlorothiazide (HYDRODIURIL) 25 MG tablet TAKE 1 TABLET DAILY.  30 tablet  6  . ibuprofen (ADVIL,MOTRIN) 200 MG tablet Take 200 mg by mouth every  6 (six) hours as needed.        Marland Kitchen levothyroxine (SYNTHROID, LEVOTHROID) 75 MCG tablet Take 1 tablet (75 mcg total) by mouth daily.  90 tablet  1  . losartan (COZAAR) 25 MG tablet Take 1 tablet (25 mg total) by mouth daily.  90 tablet  3  . Multiple Vitamins-Minerals (CENTRUM SILVER) tablet Take 1 tablet by mouth daily.      Marland Kitchen olopatadine (PATANOL) 0.1 % ophthalmic solution Place 1 drop into both eyes 2 (two) times daily. As needed  5 mL  prn  . Omega-3 Fatty Acids (FISH OIL) 1000 MG CAPS Take 1 capsule by mouth every evening.      . potassium chloride SA (K-DUR,KLOR-CON) 20 MEQ tablet Take 40 mEq by mouth daily.       No current facility-administered medications on file prior to visit.    No Known Allergies  Past Medical History  Diagnosis Date  . Asthma   . Allergic rhinitis   . GERD (gastroesophageal reflux disease)     Past Surgical History  Procedure Laterality Date  . Cholecystectomy    . Tonsillectomy    . Osteochondroma excision       History  Smoking status  . Never Smoker   Smokeless tobacco  . Not on file    History  Alcohol Use No    No family history on file.  Reviw of Systems:  Reviewed in the HPI.  All other systems are negative.  Physical Exam: Blood pressure 132/88, pulse 69, height 5' 10.5" (1.791 m), weight 201 lb (91.173 kg). General: Well developed, well nourished, in no acute distress.  Head: Normocephalic, atraumatic, sclera non-icteric, mucus membranes are moist,   Neck: Supple. Carotids are 2 + without bruits. No JVD  Lungs: Clear bilaterally to auscultation.  Heart: regular rate.  normal  S1 S2. No murmurs, gallops or rubs.  Abdomen: Soft, non-tender, non-distended with normal bowel sounds. No hepatomegaly. No rebound/guarding. No masses.  Msk:  Strength and tone are normal  Extremities: No clubbing or cyanosis. No edema.  Distal pedal pulses are 2+ and equal bilaterally.  Neuro: Alert and oriented X 3. Moves all extremities spontaneously.  Psych:  Responds to questions appropriately with a normal affect.  ECG:  09/11/2049: Normal sinus rhythm at 69.  Assessment / Plan:

## 2014-09-11 NOTE — Patient Instructions (Signed)
Your physician recommends that you have   lab work  Today. BMet, Lipid and Liver functions.  Your physician wants you to follow-up in: 1 year with Dr. Cathie Olden. You will receive a reminder letter in the mail two months in advance. If you don't receive a letter, please call our office to schedule the follow-up appointment.

## 2014-09-12 ENCOUNTER — Telehealth: Payer: Self-pay | Admitting: *Deleted

## 2014-09-12 MED ORDER — POTASSIUM CHLORIDE CRYS ER 20 MEQ PO TBCR: 20.0000 meq | EXTENDED_RELEASE_TABLET | Freq: Two times a day (BID) | ORAL | Status: DC

## 2014-09-12 NOTE — Telephone Encounter (Signed)
I spoke with pt & he is aware of his lab results.  Confirmed potassium dose pt is currently taking ( 45meq daily not 37meq daily)  New prescription e-scribed for potassium 62meq 1 tablet twice daily  Lab results forwarded to pcp at pt request. Horton Chin RN

## 2014-09-12 NOTE — Telephone Encounter (Signed)
Message copied by Verna Czech on Fri Sep 12, 2014  2:02 PM ------      Message from: [place], [place]      Created: Fri Sep 12, 2014  1:52 PM       Potassium is borderline low.  Increase potassium to 20 mEq twice a day. He is triglyceride level is high and his HDL is low. Please advise him to decrease his intake of dietary fats. Increase his exercise. ------

## 2014-09-27 ENCOUNTER — Other Ambulatory Visit: Payer: Self-pay | Admitting: Internal Medicine

## 2014-10-08 ENCOUNTER — Ambulatory Visit (INDEPENDENT_AMBULATORY_CARE_PROVIDER_SITE_OTHER): Payer: Commercial Managed Care - PPO

## 2014-10-08 DIAGNOSIS — J309 Allergic rhinitis, unspecified: Secondary | ICD-10-CM

## 2014-10-09 ENCOUNTER — Other Ambulatory Visit: Payer: Commercial Managed Care - PPO | Admitting: Internal Medicine

## 2014-10-09 DIAGNOSIS — Z13220 Encounter for screening for lipoid disorders: Secondary | ICD-10-CM

## 2014-10-09 DIAGNOSIS — Z125 Encounter for screening for malignant neoplasm of prostate: Secondary | ICD-10-CM

## 2014-10-09 DIAGNOSIS — I10 Essential (primary) hypertension: Secondary | ICD-10-CM

## 2014-10-09 DIAGNOSIS — Z0000 Encounter for general adult medical examination without abnormal findings: Secondary | ICD-10-CM

## 2014-10-09 DIAGNOSIS — Z130 Encounter for screening for diseases of the blood and blood-forming organs and certain disorders involving the immune mechanism: Secondary | ICD-10-CM

## 2014-10-09 DIAGNOSIS — E039 Hypothyroidism, unspecified: Secondary | ICD-10-CM

## 2014-10-09 LAB — CBC WITH DIFFERENTIAL/PLATELET
BASOS ABS: 0.1 10*3/uL (ref 0.0–0.1)
BASOS PCT: 1 % (ref 0–1)
EOS ABS: 0.2 10*3/uL (ref 0.0–0.7)
Eosinophils Relative: 3 % (ref 0–5)
HCT: 45 % (ref 39.0–52.0)
HEMOGLOBIN: 15.9 g/dL (ref 13.0–17.0)
Lymphocytes Relative: 28 % (ref 12–46)
Lymphs Abs: 1.7 10*3/uL (ref 0.7–4.0)
MCH: 30.7 pg (ref 26.0–34.0)
MCHC: 35.3 g/dL (ref 30.0–36.0)
MCV: 86.9 fL (ref 78.0–100.0)
Monocytes Absolute: 0.5 10*3/uL (ref 0.1–1.0)
Monocytes Relative: 9 % (ref 3–12)
NEUTROS ABS: 3.6 10*3/uL (ref 1.7–7.7)
NEUTROS PCT: 59 % (ref 43–77)
PLATELETS: 277 10*3/uL (ref 150–400)
RBC: 5.18 MIL/uL (ref 4.22–5.81)
RDW: 14 % (ref 11.5–15.5)
WBC: 6.1 10*3/uL (ref 4.0–10.5)

## 2014-10-09 LAB — COMPREHENSIVE METABOLIC PANEL
ALK PHOS: 99 U/L (ref 39–117)
ALT: 35 U/L (ref 0–53)
AST: 24 U/L (ref 0–37)
Albumin: 4.4 g/dL (ref 3.5–5.2)
BILIRUBIN TOTAL: 0.7 mg/dL (ref 0.2–1.2)
BUN: 15 mg/dL (ref 6–23)
CO2: 25 mEq/L (ref 19–32)
Calcium: 9.2 mg/dL (ref 8.4–10.5)
Chloride: 103 mEq/L (ref 96–112)
Creat: 0.98 mg/dL (ref 0.50–1.35)
Glucose, Bld: 88 mg/dL (ref 70–99)
POTASSIUM: 4 meq/L (ref 3.5–5.3)
Sodium: 139 mEq/L (ref 135–145)
TOTAL PROTEIN: 6.8 g/dL (ref 6.0–8.3)

## 2014-10-09 LAB — LIPID PANEL
Cholesterol: 172 mg/dL (ref 0–200)
HDL: 31 mg/dL — ABNORMAL LOW (ref >39)
LDL CALC: 109 mg/dL — AB (ref 0–99)
Total CHOL/HDL Ratio: 5.5 Ratio
Triglycerides: 160 mg/dL — ABNORMAL HIGH (ref <150)
VLDL: 32 mg/dL (ref 0–40)

## 2014-10-10 ENCOUNTER — Encounter: Payer: Commercial Managed Care - PPO | Admitting: Internal Medicine

## 2014-10-10 LAB — PSA: PSA: 0.2 ng/mL (ref <=4.00)

## 2014-10-15 ENCOUNTER — Encounter: Payer: Self-pay | Admitting: Internal Medicine

## 2014-10-23 ENCOUNTER — Encounter: Payer: Self-pay | Admitting: Internal Medicine

## 2014-10-23 ENCOUNTER — Ambulatory Visit (INDEPENDENT_AMBULATORY_CARE_PROVIDER_SITE_OTHER): Payer: Commercial Managed Care - PPO | Admitting: Internal Medicine

## 2014-10-23 VITALS — BP 168/98 | HR 97 | Temp 97.8°F

## 2014-10-23 DIAGNOSIS — F411 Generalized anxiety disorder: Secondary | ICD-10-CM

## 2014-10-23 DIAGNOSIS — L739 Follicular disorder, unspecified: Secondary | ICD-10-CM

## 2014-10-23 MED ORDER — TRIAMCINOLONE ACETONIDE 0.1 % EX CREA: 1.0000 "application " | TOPICAL_CREAM | Freq: Two times a day (BID) | CUTANEOUS | Status: DC

## 2014-10-23 MED ORDER — ALPRAZOLAM 0.25 MG PO TABS: 0.2500 mg | ORAL_TABLET | Freq: Two times a day (BID) | ORAL | Status: DC | PRN

## 2014-10-23 NOTE — Progress Notes (Signed)
   Subjective:    Patient ID: Ricky Sharp, male    DOB: 05/17/1961, 53 y.o.   MRN: [id number]  HPI  Wife was diagnosed recently with herpes simplex type II. She had had a prior occurrence when she was in college. Husband was aware of that. He is never had any set. He's noticed some rash on the inside of his left medial leg and is concerned. Continues to go to marital counseling.    Review of Systems     Objective:   Physical Exam  Basically has small areas of folliculitis left medial leg      Assessment & Plan:  Folliculitis  Anxiety-related to marital issues and work stress  Plan: Use triamcinolone cream sparingly on areas 3 times daily. Take Xanax sparingly as needed.  25 minutes spent with patient speaking with him about stress in his life.

## 2014-10-23 NOTE — Patient Instructions (Signed)
Take Xanax sparingly. Use Triamcinolone cream on rash 3 times a day

## 2014-11-11 ENCOUNTER — Encounter: Payer: Self-pay | Admitting: Internal Medicine

## 2014-11-11 ENCOUNTER — Ambulatory Visit (INDEPENDENT_AMBULATORY_CARE_PROVIDER_SITE_OTHER): Payer: Commercial Managed Care - PPO | Admitting: Internal Medicine

## 2014-11-11 VITALS — BP 140/100 | HR 78 | Temp 98.3°F | Ht 70.5 in | Wt 206.5 lb

## 2014-11-11 DIAGNOSIS — E876 Hypokalemia: Secondary | ICD-10-CM

## 2014-11-11 DIAGNOSIS — F411 Generalized anxiety disorder: Secondary | ICD-10-CM

## 2014-11-11 DIAGNOSIS — Z658 Other specified problems related to psychosocial circumstances: Secondary | ICD-10-CM

## 2014-11-11 DIAGNOSIS — Z0000 Encounter for general adult medical examination without abnormal findings: Secondary | ICD-10-CM

## 2014-11-11 DIAGNOSIS — I10 Essential (primary) hypertension: Secondary | ICD-10-CM

## 2014-11-11 DIAGNOSIS — E039 Hypothyroidism, unspecified: Secondary | ICD-10-CM

## 2014-11-11 DIAGNOSIS — F439 Reaction to severe stress, unspecified: Secondary | ICD-10-CM

## 2014-11-11 LAB — POCT URINALYSIS DIPSTICK
CLARITY UA: NEGATIVE
Color, UA: NEGATIVE
GLUCOSE UA: NEGATIVE
Ketones, UA: NEGATIVE
Leukocytes, UA: NEGATIVE
NITRITE UA: NEGATIVE
PROTEIN UA: NEGATIVE
RBC UA: NEGATIVE
SPEC GRAV UA: 1.01
Urobilinogen, UA: 0.2
pH, UA: 5

## 2014-11-11 LAB — TSH: TSH: 1.864 u[IU]/mL (ref 0.350–4.500)

## 2014-11-11 MED ORDER — ESCITALOPRAM OXALATE 10 MG PO TABS: 5.0000 mg | ORAL_TABLET | Freq: Every day | ORAL | Status: DC

## 2014-11-11 NOTE — Patient Instructions (Signed)
See cardiologist regarding elevated blood pressure. Return in 6 months for office visit and TSH. Lexapro refilled.

## 2014-11-11 NOTE — Progress Notes (Signed)
   Subjective:    Patient ID: Ricky Sharp, male    DOB: 14-Apr-1961, 53 y.o.   MRN: [id number]  HPI 53 year old White Male in today for health maintenance exam and evaluation of medical issues. Seeing Dr. Lenora Boys for counseling regarding marital issues. History of anxiety. History of elevated blood pressure. History of hypothyroidism.  Followed by Dr. Acie Fredrickson, Cardiologist regarding elevated blood pressure. His blood pressures elevated today but it may be labile/office hypertension related. Unable to tolerate losartan. Currently just on diuretic.  History of asthma and GE reflux. Hypothyroidism diagnosed January 2015.  Lipoma removed from right upper back 2008 by Dr. Hulen Skains. Admitted to [facility] 2003 with chest pain, epigastric pain. Had upper endoscopy. Diagnosed with peptic ulcer disease secondary to H. pylori infection. MI was ruled out. Had negative Cardiolite study.  Social history: He is married he has 2 children. Oldest daughter is in college it [place]. Youngest daughter is senior in high school. Wife has history of metastatic colon cancer and is doing well. She is an Forensic psychologist. He works in Engineer, technical sales for [employer]. History of social anxiety. He's tried benzodiazepines and SSRIs in the past. He has a Masters degree. Does not smoke. Rarely drinks exam call. Enjoys woodworking and exercising.  Past medical history: Fractured left clavicle 1981. History of hepatitis 1966 . In 1998 he had a positive hepatitis B antibody. History of allergic rhinitis. Wisdom teeth extraction 1982. Osteochondroma removed from right tibia 1983. Kidney stone 2003.  Family history: Mother with history of asthma. No siblings. Does not know his biological father.    Review of Systems  Constitutional: Negative.   HENT: Negative.   Respiratory: Negative.   Cardiovascular: Negative.   Gastrointestinal: Negative.   Allergic/Immunologic: Positive for environmental allergies.  Psychiatric/Behavioral:       Anxiety       Objective:   Physical Exam  Constitutional: He is oriented to person, place, and time. He appears well-developed and well-nourished. No distress.  HENT:  Head: Normocephalic and atraumatic.  Right Ear: External ear normal.  Left Ear: External ear normal.  Mouth/Throat: Oropharynx is clear and moist. No oropharyngeal exudate.  Eyes: Conjunctivae and EOM are normal. Pupils are equal, round, and reactive to light. Right eye exhibits no discharge. Left eye exhibits no discharge. No scleral icterus.  Neck: Neck supple. No JVD present. No thyromegaly present.  Cardiovascular: Normal rate, regular rhythm, normal heart sounds and intact distal pulses.   No murmur heard. Pulmonary/Chest: He has no wheezes. He has no rales.  Abdominal: Soft. Bowel sounds are normal. He exhibits no distension and no mass. There is no tenderness. There is no rebound and no guarding.  Genitourinary: Prostate normal.  Lymphadenopathy:    He has no cervical adenopathy.  Neurological: He is alert and oriented to person, place, and time. He has normal reflexes. No cranial nerve deficit. Coordination normal.  Skin: Skin is warm and dry. No rash noted. He is not diaphoretic.  Psychiatric: He has a normal mood and affect. His behavior is normal. Judgment and thought content normal.  Vitals reviewed.         Assessment & Plan:  Elevated blood pressure despite diuretic. Patient will contact cardiologist regarding medication recommendation  Anxiety-Lexapro refilled  Situational stress-  Hypothyroidism-stable on treatment  Hypokalemia on diuretic therapy-treated with potassium supplement  Plan: Follow-up in 6 months.

## 2014-11-12 ENCOUNTER — Telehealth: Payer: Self-pay | Admitting: Cardiovascular Disease

## 2014-11-12 DIAGNOSIS — I10 Essential (primary) hypertension: Secondary | ICD-10-CM

## 2014-11-12 NOTE — Telephone Encounter (Signed)
Spoke with patient who states his blood pressure has been elevated again (140/100) and (168/98) recently and he would like to know what medication Dr. Acie Fredrickson would like him to take.  I reviewed patient's chart and discussed past history of Losartan, which caused urinary urgency that interfered with patient's daily life.  Patient states he continues to take HCTZ 25 mg once daily and Kdur 20 meq BID and has not had any additional urinary urgency.  Patient's last K+ on 10/29 was 4.0.  I advised patient that I will review with Dr. Acie Fredrickson and give him a call back tomorrow with his advice. Patient verbalized understanding and agreement.

## 2014-11-12 NOTE — Telephone Encounter (Signed)
New problem   Pt is having trouble with his blood pressure being high. Please advise.

## 2014-11-13 MED ORDER — LISINOPRIL 10 MG PO TABS: 10.0000 mg | ORAL_TABLET | Freq: Every day | ORAL | Status: DC

## 2014-11-13 NOTE — Telephone Encounter (Signed)
Spoke with patient and advised him of Dr. Elmarie Shiley recommendation to start Lisinopril 10 mg once daily and to come in for BMET in 3 weeks.  I sent Rx to patient's pharmacy and schedule lab appointment for 12/29.  Patient verbalized understanding and agreement and will call if he has any problems with Lisinopril, questions or concerns.

## 2014-11-13 NOTE — Telephone Encounter (Signed)
He did not tolerate Losartan. He is on HCTZ Is there anything new in his diet or exerci Try Lininopril 10  mg a day BMP in 3 weeks.

## 2014-11-26 ENCOUNTER — Telehealth: Payer: Self-pay | Admitting: Cardiovascular Disease

## 2014-11-26 NOTE — Telephone Encounter (Signed)
Returned patient's call about his symptoms with lisinopril. He stated " I have been having diarrhea, gas and weird feeling on his left side of abdomen." The patient does not feel this way every day, but most days he does. His blood pressures have been SBP 130's  To 140's and DBP 80's  To 90's. He is working on keeping a low salt diet. He had also called his GI doctor and they told him he had IBS. He is continuing on his HCTZ  and will keep his lab appointment on 12/09/14. I told the patient that I would forward to Dr. Acie Fredrickson for any changes he might have for the patient. Patient is okay with continuing lisinopril at this time and can handle side effects, but would prefer another option.

## 2014-11-26 NOTE — Telephone Encounter (Signed)
New message      Do you want him to continue taking lisinopril because it is causing diarrhea,gas, weird abd feeling.  Please call

## 2014-11-27 NOTE — Telephone Encounter (Signed)
GI distress is not a typical side effect of lisinopril .  I realize that he has had similar side effects on Losartan. Have him hold the Lisinopril for 2 weeks and see if the GI issues resolve

## 2014-11-27 NOTE — Telephone Encounter (Signed)
Called patient back. Informed him he could hold his Lisinopril for two weeks and see if GI issues resolve. Patient verbalized understanding. Patient will call with any other questions.

## 2014-12-01 ENCOUNTER — Telehealth: Payer: Self-pay | Admitting: Cardiovascular Disease

## 2014-12-01 NOTE — Telephone Encounter (Signed)
New message        Pt would like for dr Acie Fredrickson nurse to call him back

## 2014-12-01 NOTE — Telephone Encounter (Signed)
I spoke with the Ricky Sharp and he wanted to make Dr Acie Fredrickson aware that all of his GI symptoms resolved within 3 days of stopping Lisinopril.  The Ricky Sharp has not monitored his BP since stopping his medication. I advised the Ricky Sharp that Dr Acie Fredrickson is not in the office this week and I will forward this message to him for review when he returns. I also instructed the Ricky Sharp to monitor BP.  Ricky Sharp agreed with plan.

## 2014-12-02 NOTE — Telephone Encounter (Signed)
FYI: The pt is not due to see you again until September 2016.

## 2014-12-02 NOTE — Telephone Encounter (Signed)
I agree that it seems like the Lisinopril was the cause of the GI issues. Will see him at his next office visit to see what else was can try for his BP

## 2014-12-03 NOTE — Telephone Encounter (Signed)
Please have him worked into my schedule in the next several weeks

## 2014-12-09 ENCOUNTER — Other Ambulatory Visit (INDEPENDENT_AMBULATORY_CARE_PROVIDER_SITE_OTHER): Payer: Commercial Managed Care - PPO | Admitting: *Deleted

## 2014-12-09 DIAGNOSIS — I10 Essential (primary) hypertension: Secondary | ICD-10-CM

## 2014-12-09 LAB — BASIC METABOLIC PANEL
BUN: 14 mg/dL (ref 6–23)
CALCIUM: 9.2 mg/dL (ref 8.4–10.5)
CHLORIDE: 101 meq/L (ref 96–112)
CO2: 30 mEq/L (ref 19–32)
Creatinine, Ser: 1.1 mg/dL (ref 0.4–1.5)
GFR: 74.69 mL/min (ref >60.00)
GLUCOSE: 136 mg/dL — AB (ref 70–99)
Potassium: 3.7 mEq/L (ref 3.5–5.1)
SODIUM: 138 meq/L (ref 135–145)

## 2014-12-10 ENCOUNTER — Telehealth: Payer: Self-pay | Admitting: Nurse Practitioner

## 2014-12-10 ENCOUNTER — Encounter: Payer: Self-pay | Admitting: Nurse Practitioner

## 2014-12-10 NOTE — Telephone Encounter (Signed)
Called and spoke with patient to find out how he has been feeling since being off of the Lisinopril.  Patient states he is feeling well; states he recently traveled and did not have his HCTZ with him and so he has not monitored his blood pressure recently.  Patient states he is now back on the HCTZ and will monitor blood pressure.  Patient scheduled for follow-up on 1/21 and advised to call sooner with questions or concerns.  Patient verbalized understanding and agreement.

## 2015-01-01 ENCOUNTER — Encounter: Payer: Self-pay | Admitting: Cardiovascular Disease

## 2015-01-01 ENCOUNTER — Ambulatory Visit (INDEPENDENT_AMBULATORY_CARE_PROVIDER_SITE_OTHER): Payer: Commercial Managed Care - PPO | Admitting: Cardiovascular Disease

## 2015-01-01 VITALS — BP 150/100 | HR 85 | Ht 70.5 in | Wt 210.4 lb

## 2015-01-01 DIAGNOSIS — I10 Essential (primary) hypertension: Secondary | ICD-10-CM

## 2015-01-01 MED ORDER — AMLODIPINE BESYLATE 5 MG PO TABS: 5.0000 mg | ORAL_TABLET | Freq: Every day | ORAL | Status: DC

## 2015-01-01 NOTE — Progress Notes (Signed)
Cardiology Office Note   Date:  01/01/2015   ID:  Ricky Sharp, DOB [birth year], MRN [id number]  PCP:  Elby Showers, MD  Cardiologist:   Thayer Headings, MD   Chief Complaint  Patient presents with  . Follow-up    htn      History of Present Illness: Ricky Sharp is a 54 y.o. male who presents for HTN  1. Hypertension 2. Anxiety - stress related 3. GERD  History of Present Illness:  Patient is a 54 year old gentleman with a history of hypertension. He's tried several medications in the past that have not completely controlled his blood pressure.  - He has tried Losartan but had some dizziness / dry mouth.  - tried bystolic 5 mg - felt poorly, persistant head ache, diarrhea, mild respiratory distress. Tried for a week  He has also tried some antianxiety meds but had various reactions.  He has multiple family and life stresses.   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 He is also having some family health issues that require him to spend lots of time with them.  He has felt well on his HCTZ and potassium since his last visit. He has not been exercising as much as he would like.  Oct. 1, 2014:  Ricky Sharp is a 54 yo with hx of HTN. He has not been exercising. He has gained 10 lbs. He has tried Losartan but it caused vertigo. He tried beta blockers but had severe fatigue and slow HR. He does not want to start any new meds. He knows that his weight is up and that is BP has been elevated.   March 04, 2014:  Ricky Sharp has been diagnosed with hypothyroidism since I last saw him. He is feeling somewhat better.  Diastolic BP has still been a bit high. Still eating some salt. BP at home is typically is the 80s - occasionally upper 80s.   Oct. 1, 2015:  Ricky Sharp is now off the Losartan - was having lots of fatigue and diarrhea. Symptoms are better. On HCTZ  and blood pressures are well controlled. Rides his bike on occasion. He also is taking Judo . Not Having any episodes of chest pain BP has been ok  Jan. 21, 2016:  Ricky Sharp has had HTN that has been difficult to manage.  He developed GI complaints with Losartan and with Lisinopril.  He had significant diarrhea on the Lisinopril - has resolved.   Past Medical History  Diagnosis Date  . Asthma   . Allergic rhinitis   . GERD (gastroesophageal reflux disease)     Past Surgical History  Procedure Laterality Date  . Cholecystectomy    . Tonsillectomy    . Osteochondroma excision       Current Outpatient Prescriptions  Medication Sig Dispense Refill  . ALPRAZolam (XANAX) 0.25 MG tablet Take 1 tablet (0.25 mg total) by mouth 2 (two) times daily as needed for anxiety. 60 tablet 0  . CARAFATE 1 GM/10ML suspension Take by mouth as directed.     . clotrimazole-betamethasone (LOTRISONE) cream Apply 1 application topically 2 (two) times daily. 30 g 5  . cromolyn (NASALCROM) 5.2 MG/ACT nasal spray Place 1 spray into the nose 4 (four) times daily.      Marland Kitchen dexlansoprazole (DEXILANT) 60 MG capsule Take 60 mg by mouth daily as needed.     Marland Kitchen DYMISTA 137-50 MCG/ACT SUSP USE 1-2 SPRAYS IN EACH NOSTRIL  AT BEDTIME AS DIRECTED 23 g PRN  . EPINEPHrine (EPIPEN JR) 0.15 MG/0.3 ML injection Inject into thigh if needed for severe allergic reavtion 1 each prn  . escitalopram (LEXAPRO) 10 MG tablet Take 0.5 tablets (5 mg total) by mouth daily. 30 tablet 5  . hydrochlorothiazide (HYDRODIURIL) 25 MG tablet TAKE 1 TABLET DAILY. 30 tablet 6  . ibuprofen (ADVIL,MOTRIN) 200 MG tablet Take 200 mg by mouth every 6 (six) hours as needed.      Marland Kitchen levothyroxine (SYNTHROID, LEVOTHROID) 75 MCG tablet TAKE 1 TABLET ONCE DAILY. 34 tablet 5  . lisinopril (PRINIVIL,ZESTRIL) 10 MG tablet Take 1 tablet (10 mg total) by mouth daily. 31 tablet 11  . Multiple Vitamins-Minerals (CENTRUM SILVER) tablet Take 1 tablet by mouth daily.    Marland Kitchen  olopatadine (PATANOL) 0.1 % ophthalmic solution Place 1 drop into both eyes 2 (two) times daily. As needed 5 mL prn  . Omega-3 Fatty Acids (FISH OIL) 1000 MG CAPS Take 1 capsule by mouth every evening.    . potassium chloride (K-DUR) 10 MEQ tablet Take 10 mEq by mouth daily. Take 2 tablet in the AM and 2 tablets in the PM.    . triamcinolone cream (KENALOG) 0.1 % Apply 1 application topically 2 (two) times daily. 45 g 2   No current facility-administered medications for this visit.    Allergies:   Lisinopril and Losartan    Social History:  The patient  reports that he has never smoked. He does not have any smokeless tobacco history on file. He reports that he does not drink alcohol or use illicit drugs.   Family History:  The patient's family history includes Asthma in his mother; Fibromyalgia in his mother; Heart attack in his father.    ROS:  Please see the history of present illness.   Otherwise, review of systems are positive for none.   All other systems are reviewed and negative.    PHYSICAL EXAM: VS:  BP 150/100 mmHg  Pulse 85  Ht 5' 10.5" (1.791 m)  Wt 210 lb 6.4 oz (95.437 kg)  BMI 29.75 kg/m2  SpO2 98% , BMI Body mass index is 29.75 kg/(m^2). GEN: Well nourished, well developed, in no acute distress HEENT: normal Neck: no JVD, carotid bruits, or masses Cardiac: RRR; no murmurs, rubs, or gallops,no edema  Respiratory:  clear to auscultation bilaterally, normal work of breathing GI: soft, nontender, nondistended, + BS MS: no deformity or atrophy Skin: warm and dry, no rash Neuro:  Strength and sensation are intact Psych: euthymic mood, full affect   EKG:  EKG is not ordered today.    Recent Labs: 10/09/2014: ALT 35; Hemoglobin 15.9; Platelets 277 11/11/2014: TSH 1.864 12/09/2014: BUN 14; Creatinine 1.1; Potassium 3.7; Sodium 138    Lipid Panel    Component Value Date/Time   CHOL 172 10/09/2014 0921   TRIG 160* 10/09/2014 0921   HDL 31* 10/09/2014 0921    CHOLHDL 5.5 10/09/2014 0921   VLDL 32 10/09/2014 0921   LDLCALC 109* 10/09/2014 0921      Wt Readings from Last 3 Encounters:  01/01/15 210 lb 6.4 oz (95.437 kg)  11/11/14 206 lb 8 oz (93.668 kg)  09/11/14 201 lb (91.173 kg)      Other studies Reviewed: Additional studies/ records that were reviewed today include: . Review of the above records demonstrates:    ASSESSMENT AND PLAN:  1.  Essential  Hypertension:  Ricky Sharp presents again with hypertension. He has tried by systolic, losartan,  lisinopril but did not tolerate any of these medications. We'll try him on amlodipine 5 mg a day. We may try him on low-dose carvedilol. I have reminded him to watch his salt intake. He should exercise every day.   Current medicines are reviewed at length with the patient today.  The patient does not have concerns regarding medicines.  The following changes have been made:  Add Amlodipine   Labs/ tests ordered today include:  No orders of the defined types were placed in this encounter.     Disposition:   FU with me  in 6 months   Signed, Nahser, Wonda Cheng, MD  01/01/2015 3:53 PM    [facility] [place], [place], [place]  [address] Phone: [phone]; Fax: [phone]

## 2015-01-01 NOTE — Patient Instructions (Signed)
Your physician has recommended you make the following change in your medication:  START Amlodipine 5 mg once daily  Your physician recommends that you schedule a follow-up appointment in: 3 months with Dr. Acie Fredrickson.

## 2015-01-07 ENCOUNTER — Ambulatory Visit (INDEPENDENT_AMBULATORY_CARE_PROVIDER_SITE_OTHER): Payer: Commercial Managed Care - PPO | Admitting: Pharmacist

## 2015-01-07 ENCOUNTER — Encounter: Payer: Self-pay | Admitting: Pharmacist

## 2015-01-07 VITALS — BP 138/98 | Wt 206.5 lb

## 2015-01-07 DIAGNOSIS — I10 Essential (primary) hypertension: Secondary | ICD-10-CM

## 2015-01-07 NOTE — Patient Instructions (Addendum)
It was nice to meet you today.  Continue current BP meds (amlodipine and HCTZ) Continue with exercise and try to incorporate DASH diet recommendations Follow up visit in 1 month

## 2015-01-07 NOTE — Progress Notes (Signed)
Pharmacist Hypertension Clinic - Resident Research Study  S/O: Ricky Sharp is a 54 y.o. male presenting to clinic today for initial study visit and referred to hypertension clinic by Dr. Acie Fredrickson d/t pt having uncontrolled HTN and very sensitive to medications.  Pt reports that he has IBS and many meds cause GI side effects (including urgent diarrhea).  Pt was started on amlodpine 5mg  daily just last week after visit w/ Dr. Acie Fredrickson.  Pt has overall been tolerating well, but has recently been experiencing some cramping and diarrhea.  Pt has switched to taking his BP meds at night in an attempt to sleep through some of these side effects (e.g. Cramping).  Pt reports that he will continue with current HCTZ and amlodipine despite diarrhea and notify us if diarrhea persists or if he discontinues the amlodipine.  Ricky Sharp reports very rarely missing any doses of meds - seems rather knowledgeable about his meds and side effects associated w/ previous meds.  Pt has previously tried but failed: Lisinopril (diarrhea) Losartan (diarrhea, fatigue) Nebivolol (diarrhea, fatigue, headache)  Current HTN meds: HCTZ 25mg  daily Amlodipine 5mg  daily  Pt reports increased exercise over the past year.  He reports cycling ~48mi 2x/wk when the weather is nice, recently started doing martial arts (Judo) 1.5hr 2x/wk.  FH: mother w/ HTN, biological father unknown, only child Per Ricky Sharp's note from 01/01/15: father w/ heart attack (possibly adopted father?)  SH: Pt has never smoked, denies alcohol as well as illicit drugs  Wt Readings from Last 3 Encounters:  01/07/15 206 lb 8 oz (93.668 kg)  01/01/15 210 lb 6.4 oz (95.437 kg)  11/11/14 206 lb 8 oz (93.668 kg)   BP Readings from Last 3 Encounters:  01/07/15 138/98  01/01/15 150/100  11/11/14 140/100   Pulse Readings from Last 3 Encounters:  01/01/15 85  11/11/14 78  10/23/14 97    Renal function: CrCl > 80 mL/min   Outpatient Encounter  Prescriptions as of 01/07/2015  Medication Sig Note  . ALPRAZolam (XANAX) 0.25 MG tablet Take 1 tablet (0.25 mg total) by mouth 2 (two) times daily as needed for anxiety.   Marland Kitchen amLODipine (NORVASC) 5 MG tablet Take 1 tablet (5 mg total) by mouth daily.   Marland Kitchen CARAFATE 1 GM/10ML suspension Take by mouth as directed.    . clotrimazole-betamethasone (LOTRISONE) cream Apply 1 application topically 2 (two) times daily.   . cromolyn (NASALCROM) 5.2 MG/ACT nasal spray Place 1 spray into the nose 4 (four) times daily.     Marland Kitchen dexlansoprazole (DEXILANT) 60 MG capsule Take 60 mg by mouth daily as needed.    Marland Kitchen DYMISTA 137-50 MCG/ACT SUSP USE 1-2 SPRAYS IN EACH NOSTRIL AT BEDTIME AS DIRECTED   . EPINEPHrine (EPIPEN JR) 0.15 MG/0.3 ML injection Inject into thigh if needed for severe allergic reavtion   . escitalopram (LEXAPRO) 10 MG tablet Take 0.5 tablets (5 mg total) by mouth daily.   . hydrochlorothiazide (HYDRODIURIL) 25 MG tablet TAKE 1 TABLET DAILY.   Marland Kitchen ibuprofen (ADVIL,MOTRIN) 200 MG tablet Take 200 mg by mouth every 6 (six) hours as needed.     Marland Kitchen levothyroxine (SYNTHROID, LEVOTHROID) 75 MCG tablet TAKE 1 TABLET ONCE DAILY.   . Multiple Vitamins-Minerals (CENTRUM SILVER) tablet Take 1 tablet by mouth daily.   Marland Kitchen olopatadine (PATANOL) 0.1 % ophthalmic solution Place 1 drop into both eyes 2 (two) times daily. As needed   . Omega-3 Fatty Acids (FISH OIL) 1000 MG CAPS Take 1 capsule by mouth every  evening.   . potassium chloride (K-DUR) 10 MEQ tablet Take 10 mEq by mouth daily. Take 2 tablet in the AM and 2 tablets in the PM. 01/01/2015: Received from: External Pharmacy Received Sig:   . triamcinolone cream (KENALOG) 0.1 % Apply 1 application topically 2 (two) times daily.   . [DISCONTINUED] lisinopril (PRINIVIL,ZESTRIL) 10 MG tablet Take 1 tablet (10 mg total) by mouth daily.     Allergies: Allergies  Allergen Reactions  . Lisinopril     diarrhea  . Losartan     Fatigue, diarrhea    A/P: Pt has  qualified for study enrollment and has signed informed consent. Pt was randomized to study group: Standard of care/clinic visits  Pt is not at BP goal of < 140/90 mmHg but pt only recently added amlodipine.  Will continue current treatment with HCTZ 25mg  daily and amlodipine 5mg  daily.  I spent time today educating pt on DASH diet recommendations.  I also encouraged him to continue with regular exercise, especially as it begins to warm up again.  Will F/U in 54mo.  Future visits may consider increasing amlodipine to 10mg  daily if pt continues to tolerate, increasing HCTZ to 50mg  daily (would likely need to increase KCl as well), or adding low dose Coreg 3.125 BID (educate on symptoms of fatigue upon initiation).  Could attempt low dose ACEi/ARB if pt is willing.  When initiating new BP meds, would encourage starting at very low doses and slowly titrating up, need to counsel on side effects such as fatigue as BP starts to come down.  Drucie Opitz, PharmD Clinical Pharmacy Resident

## 2015-02-04 ENCOUNTER — Ambulatory Visit (INDEPENDENT_AMBULATORY_CARE_PROVIDER_SITE_OTHER): Payer: Commercial Managed Care - PPO | Admitting: Pharmacist

## 2015-02-04 VITALS — BP 142/96 | HR 75 | Wt 209.0 lb

## 2015-02-04 DIAGNOSIS — I10 Essential (primary) hypertension: Secondary | ICD-10-CM

## 2015-02-04 NOTE — Progress Notes (Signed)
Pharmacist Hypertension Clinic - Resident Research Study  S/O: Ricky Sharp is a 54 y.o. male presenting to clinic today for follow up visit and referred to hypertension clinic by Dr. Acie Fredrickson d/t pt having uncontrolled HTN and very sensitive to medications.  Pt reports that he has IBS and many meds cause GI side effects (including urgent diarrhea).  Pt was started on amlodpine 5mg  daily last week.  He states he ad some cramping and diarrhea to begin with but this has gotten much better and is only once a day in the morning.  He states he also noticed about a 10% decrease in energy level but this seems to be improving slightly.  He has been checking his BP at home sporadically.  This AM his reading was 135/87.    Pt has previously tried but failed: Lisinopril (diarrhea) Losartan (diarrhea, fatigue) Nebivolol (diarrhea, fatigue, headache)  Current HTN meds: HCTZ 25mg  daily Amlodipine 5mg  daily  Pt reports increased exercise over the past year.  He reports cycling ~8mi 2x/wk when the weather is nice, recently started doing martial arts (Judo) 1.5hr 2x/wk.  FH: mother w/ HTN, biological father unknown, only child Per Nahser's note from 01/01/15: father w/ heart attack (possibly adopted father?)  SH: Pt has never smoked, denies alcohol as well as illicit drugs  Wt Readings from Last 3 Encounters:  02/04/15 209 lb (94.802 kg)  01/07/15 206 lb 8 oz (93.668 kg)  01/01/15 210 lb 6.4 oz (95.437 kg)   BP Readings from Last 3 Encounters:  02/04/15 142/96  01/07/15 138/98  01/01/15 150/100   Pulse Readings from Last 3 Encounters:  02/04/15 75  01/01/15 85  11/11/14 78    Home BP readings:  1/28- 141/81 HR 68 2/3- 144/81 HR 63 2/4- 135/81 HR 66 2/9- 139/85 HR 67 2/24- 135/87 HR 64 Renal function: CrCl > 80 mL/min   Outpatient Encounter Prescriptions as of 02/04/2015  Medication Sig Note  . ALPRAZolam (XANAX) 0.25 MG tablet Take 1 tablet (0.25 mg total) by mouth 2 (two) times  daily as needed for anxiety.   Marland Kitchen amLODipine (NORVASC) 5 MG tablet Take 1 tablet (5 mg total) by mouth daily.   Marland Kitchen CARAFATE 1 GM/10ML suspension Take by mouth as directed.    . clotrimazole-betamethasone (LOTRISONE) cream Apply 1 application topically 2 (two) times daily.   . cromolyn (NASALCROM) 5.2 MG/ACT nasal spray Place 1 spray into the nose 4 (four) times daily.     Marland Kitchen dexlansoprazole (DEXILANT) 60 MG capsule Take 60 mg by mouth daily as needed.    Marland Kitchen DYMISTA 137-50 MCG/ACT SUSP USE 1-2 SPRAYS IN EACH NOSTRIL AT BEDTIME AS DIRECTED   . EPINEPHrine (EPIPEN JR) 0.15 MG/0.3 ML injection Inject into thigh if needed for severe allergic reavtion   . escitalopram (LEXAPRO) 10 MG tablet Take 0.5 tablets (5 mg total) by mouth daily.   . hydrochlorothiazide (HYDRODIURIL) 25 MG tablet TAKE 1 TABLET DAILY.   Marland Kitchen ibuprofen (ADVIL,MOTRIN) 200 MG tablet Take 200 mg by mouth every 6 (six) hours as needed.     Marland Kitchen levothyroxine (SYNTHROID, LEVOTHROID) 75 MCG tablet TAKE 1 TABLET ONCE DAILY.   . Multiple Vitamins-Minerals (CENTRUM SILVER) tablet Take 1 tablet by mouth daily.   Marland Kitchen olopatadine (PATANOL) 0.1 % ophthalmic solution Place 1 drop into both eyes 2 (two) times daily. As needed   . Omega-3 Fatty Acids (FISH OIL) 1000 MG CAPS Take 1 capsule by mouth every evening.   . potassium chloride (K-DUR) 10 MEQ  tablet Take 10 mEq by mouth daily. Take 2 tablet in the AM and 2 tablets in the PM. 01/01/2015: Received from: External Pharmacy Received Sig:   . triamcinolone cream (KENALOG) 0.1 % Apply 1 application topically 2 (two) times daily.     Allergies: Allergies  Allergen Reactions  . Lisinopril     diarrhea  . Losartan     Fatigue, diarrhea    A/P: Pt has qualified for study enrollment and has signed informed consent. Pt was randomized to study group: Standard of care/clinic visits  Pt's BP was slightly elevated in office today but has been below goal at home.  Pt has been working on watching salt and  continuing to be active.  Will continue current medications given home BP readings.  Since no changes made today, will follow up for final study visit in 2 months with Dr. Johnsie Cancel appt.

## 2015-02-04 NOTE — Patient Instructions (Signed)
Continue your current medications.   We will see you again in 2 months.

## 2015-02-08 ENCOUNTER — Encounter: Payer: Self-pay | Admitting: Internal Medicine

## 2015-03-05 ENCOUNTER — Encounter: Payer: Self-pay | Admitting: Internal Medicine

## 2015-03-05 ENCOUNTER — Ambulatory Visit (INDEPENDENT_AMBULATORY_CARE_PROVIDER_SITE_OTHER): Payer: Commercial Managed Care - PPO | Admitting: Internal Medicine

## 2015-03-05 VITALS — BP 144/86 | HR 94 | Temp 98.2°F | Wt 211.0 lb

## 2015-03-05 DIAGNOSIS — J0100 Acute maxillary sinusitis, unspecified: Secondary | ICD-10-CM

## 2015-03-05 DIAGNOSIS — J069 Acute upper respiratory infection, unspecified: Secondary | ICD-10-CM

## 2015-03-05 MED ORDER — METHYLPREDNISOLONE ACETATE 80 MG/ML IJ SUSP
80.0000 mg | Freq: Once | INTRAMUSCULAR | Status: AC
  Administered 2015-03-05: 80 mg via INTRAMUSCULAR

## 2015-03-05 MED ORDER — CLARITHROMYCIN 500 MG PO TABS: 500.0000 mg | ORAL_TABLET | Freq: Two times a day (BID) | ORAL | Status: DC

## 2015-03-05 MED ORDER — HYDROCODONE-HOMATROPINE 5-1.5 MG/5ML PO SYRP: 5.0000 mL | ORAL_SOLUTION | Freq: Three times a day (TID) | ORAL | Status: DC | PRN

## 2015-03-05 NOTE — Progress Notes (Signed)
   Subjective:    Patient ID: Ricky Sharp, male    DOB: [birth year], 54 y.o.   MRN: [id number]  HPI  In today with URI symptoms. Started with postnasal drip and discolored nasal drainage. No fever or shaking chills. Has malaise and fatigue. Mother is ill and hospital in [place] with probable congestive heart failure. Was working out of town for several days and got very fatigued. No recent URI. History of allergic rhinitis. Thought it might initially be allergy related.    Review of Systems     Objective:   Physical Exam  Skin warm and dry. Nodes none. TMs are clear. Pharynx is clear. Neck is supple. Chest clear. Sounds nasally congested.      Assessment & Plan:  Acute URI  Sinusitis  Plan: Depo-Medrol 80 mg IM. Biaxin 500 mg twice daily for 10 days. Hycodan 1 teaspoon by mouth every 6 hours when necessary cough.

## 2015-04-03 ENCOUNTER — Ambulatory Visit: Payer: Commercial Managed Care - PPO | Admitting: Pharmacist

## 2015-04-03 ENCOUNTER — Encounter: Payer: Self-pay | Admitting: Cardiovascular Disease

## 2015-04-03 ENCOUNTER — Encounter: Payer: Self-pay | Admitting: Pharmacist

## 2015-04-03 ENCOUNTER — Ambulatory Visit (INDEPENDENT_AMBULATORY_CARE_PROVIDER_SITE_OTHER): Payer: Commercial Managed Care - PPO | Admitting: Cardiovascular Disease

## 2015-04-03 ENCOUNTER — Ambulatory Visit (INDEPENDENT_AMBULATORY_CARE_PROVIDER_SITE_OTHER): Payer: Commercial Managed Care - PPO | Admitting: Pharmacist

## 2015-04-03 ENCOUNTER — Ambulatory Visit (INDEPENDENT_AMBULATORY_CARE_PROVIDER_SITE_OTHER): Payer: Commercial Managed Care - PPO

## 2015-04-03 VITALS — BP 136/82 | HR 85 | Ht 70.5 in | Wt 206.8 lb

## 2015-04-03 VITALS — BP 140/86 | HR 85 | Ht 70.5 in | Wt 206.8 lb

## 2015-04-03 DIAGNOSIS — I10 Essential (primary) hypertension: Secondary | ICD-10-CM

## 2015-04-03 DIAGNOSIS — J309 Allergic rhinitis, unspecified: Secondary | ICD-10-CM | POA: Diagnosis not present

## 2015-04-03 NOTE — Patient Instructions (Signed)
Medication Instructions:  Your physician recommends that you continue on your current medications as directed. Please refer to the Current Medication list given to you today.  Labwork: No new orders.  Testing/Procedures: No new orders.  Follow-Up: Your physician wants you to follow-up in: 1 YEAR with Dr Nahser.  You will receive a reminder letter in the mail two months in advance. If you don't receive a letter, please call our office to schedule the follow-up appointment.   Any Other Special Instructions Will Be Listed Below (If Applicable).   

## 2015-04-03 NOTE — Patient Instructions (Signed)
It has been a pleasure working with you these past few months  Your blood pressure is now at goal - great! Continue taking all of your medicines as prescribed Continue care with your primary care doctor and Dr. Acie Fredrickson

## 2015-04-03 NOTE — Progress Notes (Signed)
Cardiology Office Note   Date:  04/03/2015   ID:  Ricky Sharp, DOB 1961-06-18, MRN [id number]  PCP:  Elby Showers, MD  Cardiologist:   Thayer Headings, MD   Chief Complaint  Patient presents with  . Hypertension      History of Present Illness: Ricky Sharp is a 54 y.o. male who presents for HTN  1. Hypertension 2. Anxiety - stress related 3. GERD  History of Present Illness:  Patient is a 54 year old gentleman with a history of hypertension. He's tried several medications in the past that have not completely controlled his blood pressure.  - He has tried Losartan but had some dizziness / dry mouth.  - tried bystolic 5 mg - felt poorly, persistant head ache, diarrhea, mild respiratory distress. Tried for a week  He has also tried some antianxiety meds but had various reactions.  He has multiple family and life stresses.   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 He is also having some family health issues that require him to spend lots of time with them.  He has felt well on his HCTZ and potassium since his last visit. He has not been exercising as much as he would like.  Oct. 1, 2014:  Ricky Sharp is a 54 yo with hx of HTN. He has not been exercising. He has gained 10 lbs. He has tried Losartan but it caused vertigo. He tried beta blockers but had severe fatigue and slow HR. He does not want to start any new meds. He knows that his weight is up and that is BP has been elevated.   March 04, 2014:  Ricky Sharp has been diagnosed with hypothyroidism since I last saw him. He is feeling somewhat better.  Diastolic BP has still been a bit high. Still eating some salt. BP at home is typically is the 80s - occasionally upper 80s.   Oct. 1, 2015:  Ricky Sharp is now off the Losartan - was having lots of fatigue and diarrhea. Symptoms are better. On HCTZ and  blood pressures are well controlled. Rides his bike on occasion. He also is taking Judo . Not Having any episodes of chest pain BP has been ok  Jan. 21, 2016:  Ricky Sharp has had HTN that has been difficult to manage.  He developed GI complaints with Losartan and with Lisinopril.  He had significant diarrhea on the Lisinopril - has resolved.   April 03, 2015:  Ricky Sharp has been seen in the hypertension clinic for the past several months. His blood pressure is now well controlled. No CP , no dyspnea. Still has anxiety.    Past Medical History  Diagnosis Date  . Asthma   . Allergic rhinitis   . GERD (gastroesophageal reflux disease)     Past Surgical History  Procedure Laterality Date  . Cholecystectomy    . Tonsillectomy    . Osteochondroma excision       Current Outpatient Prescriptions  Medication Sig Dispense Refill  . ALPRAZolam (XANAX) 0.25 MG tablet Take 1 tablet (0.25 mg total) by mouth 2 (two) times daily as needed for anxiety. 60 tablet 0  . amLODipine (NORVASC) 5 MG tablet Take 1 tablet (5 mg total) by mouth daily. 31 tablet 11  . CARAFATE 1 GM/10ML suspension Take by mouth as directed.     . cromolyn (NASALCROM) 5.2 MG/ACT nasal spray Place 1 spray into the nose 4 (four) times daily.      Marland Kitchen  dexlansoprazole (DEXILANT) 60 MG capsule Take 60 mg by mouth daily as needed.     Marland Kitchen DYMISTA 137-50 MCG/ACT SUSP USE 1-2 SPRAYS IN EACH NOSTRIL AT BEDTIME AS DIRECTED 23 g PRN  . EPINEPHrine (EPIPEN JR) 0.15 MG/0.3 ML injection Inject into thigh if needed for severe allergic reavtion 1 each prn  . escitalopram (LEXAPRO) 10 MG tablet Take 0.5 tablets (5 mg total) by mouth daily. 30 tablet 5  . hydrochlorothiazide (HYDRODIURIL) 25 MG tablet TAKE 1 TABLET DAILY. 30 tablet 6  . ibuprofen (ADVIL,MOTRIN) 200 MG tablet Take 200 mg by mouth every 6 (six) hours as needed.      Marland Kitchen levothyroxine (SYNTHROID, LEVOTHROID) 75 MCG tablet TAKE 1 TABLET ONCE DAILY. 34 tablet 5  . Multiple  Vitamins-Minerals (CENTRUM SILVER) tablet Take 1 tablet by mouth daily.    Marland Kitchen olopatadine (PATANOL) 0.1 % ophthalmic solution Place 1 drop into both eyes 2 (two) times daily. As needed 5 mL prn  . Omega-3 Fatty Acids (FISH OIL) 1000 MG CAPS Take 1 capsule by mouth every evening.    . potassium chloride (K-DUR) 10 MEQ tablet Take 10 mEq by mouth daily. Take 2 tablet in the AM and 2 tablets in the PM.    . triamcinolone cream (KENALOG) 0.1 % Apply 1 application topically 2 (two) times daily. 45 g 2   No current facility-administered medications for this visit.    Allergies:   Lisinopril and Losartan    Social History:  The patient  reports that he has never smoked. He does not have any smokeless tobacco history on file. He reports that he does not drink alcohol or use illicit drugs.   Family History:  The patient's family history includes Asthma in his mother; Fibromyalgia in his mother; Heart attack in his father.    ROS:  Please see the history of present illness.   Otherwise, review of systems are positive for none.   All other systems are reviewed and negative.    PHYSICAL EXAM: VS:  BP 136/82 mmHg  Pulse 85  Ht 5' 10.5" (1.791 m)  Wt 206 lb 12.8 oz (93.804 kg)  BMI 29.24 kg/m2 , BMI Body mass index is 29.24 kg/(m^2). GEN: Well nourished, well developed, in no acute distress HEENT: normal Neck: no JVD, carotid bruits, or masses Cardiac: RRR; no murmurs, rubs, or gallops,no edema  Respiratory:  clear to auscultation bilaterally, normal work of breathing GI: soft, nontender, nondistended, + BS MS: no deformity or atrophy Skin: warm and dry, no rash Neuro:  Strength and sensation are intact Psych: euthymic mood, full affect   EKG:  EKG is not ordered today.    Recent Labs: 10/09/2014: ALT 35; Hemoglobin 15.9; Platelets 277 11/11/2014: TSH 1.864 12/09/2014: BUN 14; Creatinine 1.1; Potassium 3.7; Sodium 138    Lipid Panel    Component Value Date/Time   CHOL 172  10/09/2014 0921   TRIG 160* 10/09/2014 0921   HDL 31* 10/09/2014 0921   CHOLHDL 5.5 10/09/2014 0921   VLDL 32 10/09/2014 0921   LDLCALC 109* 10/09/2014 0921      Wt Readings from Last 3 Encounters:  04/03/15 206 lb 12.8 oz (93.804 kg)  03/05/15 211 lb (95.709 kg)  02/04/15 209 lb (94.802 kg)      Other studies Reviewed: Additional studies/ records that were reviewed today include: . Review of the above records demonstrates:    ASSESSMENT AND PLAN:  1.  Essential  Hypertension:  [place] presents again with hypertension. He has  tried by systolic, losartan, lisinopril but did not tolerate any of these medications. We'll try him on amlodipine 5 mg a day. We may try him on low-dose carvedilol. I have reminded him to watch his salt intake. He should exercise every day. Random is doing well.   continue current meds.   Current medicines are reviewed at length with the patient today.  The patient does not have concerns regarding medicines.  The following changes have been made:      Labs/ tests ordered today include:  No orders of the defined types were placed in this encounter.    Disposition:   FU with me  in 6 months   Signed, Nahser, Wonda Cheng, MD  04/03/2015 3:02 PM    [facility] [address], [place], [place]  [address] Phone: [phone]; Fax: [phone]

## 2015-04-03 NOTE — Progress Notes (Signed)
Pharmacist Hypertension Clinic - Resident Research Study  S/O: Ricky Sharp is a 54 y.o. male presenting to clinic today for final follow up visit, referred to hypertension clinic by Dr. Acie Fredrickson d/t pt having uncontrolled HTN and very sensitive to medications.  Pt reports that he has IBS and many meds cause GI side effects (including urgent diarrhea).  Pt was started on amlodpine 5mg  daily in Jan 2016.  He states he had some cramping and diarrhea to begin with but this has gotten much better and is only a few times per week now.  He states he also noticed about a 10% decrease in energy level but this seems to be improving slightly.  Pt has previously tried but failed: Lisinopril (diarrhea) Losartan (diarrhea, fatigue) Nebivolol (diarrhea, fatigue, headache)  Current HTN meds: HCTZ 25mg  daily Amlodipine 5mg  daily  Pt reports increased exercise over the past year.  He reports cycling ~38mi 2x/wk when the weather is nice, recently started doing martial arts (Judo) 1.5hr 2x/wk.  States that he is starting to incorporate some additional walks and short bike rides now that the weather is warming up again.  Pt states that he has been working on improving diet to help with BP control.  He has been limiting his salt intake.  He also reports working on decreasing breads and cheese to assist in weight loss.  FH: mother w/ HTN, biological father unknown, only child Per Nahser's note from 01/01/15: father w/ heart attack (possibly adopted father?)  SH: Pt has never smoked, denies alcohol as well as illicit drugs  Wt Readings from Last 3 Encounters:  04/03/15 206 lb 12.8 oz (93.804 kg)  04/03/15 206 lb 12.8 oz (93.804 kg)  03/05/15 211 lb (95.709 kg)   BP Readings from Last 3 Encounters:  04/03/15 140/86  04/03/15 136/82  03/05/15 144/86   Pulse Readings from Last 3 Encounters:  04/03/15 85  04/03/15 85  03/05/15 94   Renal function: CrCl > 80 mL/min, lytes ok   Outpatient Encounter  Prescriptions as of 04/03/2015  Medication Sig Note  . ALPRAZolam (XANAX) 0.25 MG tablet Take 1 tablet (0.25 mg total) by mouth 2 (two) times daily as needed for anxiety.   Marland Kitchen amLODipine (NORVASC) 5 MG tablet Take 1 tablet (5 mg total) by mouth daily.   Marland Kitchen CARAFATE 1 GM/10ML suspension Take by mouth as directed.    . cromolyn (NASALCROM) 5.2 MG/ACT nasal spray Place 1 spray into the nose 4 (four) times daily.     Marland Kitchen dexlansoprazole (DEXILANT) 60 MG capsule Take 60 mg by mouth daily as needed.    Marland Kitchen DYMISTA 137-50 MCG/ACT SUSP USE 1-2 SPRAYS IN EACH NOSTRIL AT BEDTIME AS DIRECTED   . EPINEPHrine (EPIPEN JR) 0.15 MG/0.3 ML injection Inject into thigh if needed for severe allergic reavtion   . escitalopram (LEXAPRO) 10 MG tablet Take 0.5 tablets (5 mg total) by mouth daily.   . hydrochlorothiazide (HYDRODIURIL) 25 MG tablet TAKE 1 TABLET DAILY.   Marland Kitchen ibuprofen (ADVIL,MOTRIN) 200 MG tablet Take 200 mg by mouth every 6 (six) hours as needed.     Marland Kitchen levothyroxine (SYNTHROID, LEVOTHROID) 75 MCG tablet TAKE 1 TABLET ONCE DAILY.   . Multiple Vitamins-Minerals (CENTRUM SILVER) tablet Take 1 tablet by mouth daily.   Marland Kitchen olopatadine (PATANOL) 0.1 % ophthalmic solution Place 1 drop into both eyes 2 (two) times daily. As needed   . Omega-3 Fatty Acids (FISH OIL) 1000 MG CAPS Take 1 capsule by mouth every evening.   Marland Kitchen  potassium chloride (K-DUR) 10 MEQ tablet Take 10 mEq by mouth daily. Take 2 tablet in the AM and 2 tablets in the PM. 01/01/2015: Received from: External Pharmacy Received Sig:   . triamcinolone cream (KENALOG) 0.1 % Apply 1 application topically 2 (two) times daily.   . [DISCONTINUED] clarithromycin (BIAXIN) 500 MG tablet Take 1 tablet (500 mg total) by mouth 2 (two) times daily.   . [DISCONTINUED] clotrimazole-betamethasone (LOTRISONE) cream Apply 1 application topically 2 (two) times daily.   . [DISCONTINUED] HYDROcodone-homatropine (HYCODAN) 5-1.5 MG/5ML syrup Take 5 mLs by mouth every 8 (eight) hours as  needed for cough.     Allergies: Allergies  Allergen Reactions  . Lisinopril     diarrhea  . Losartan     Fatigue, diarrhea    A/P: Pt has qualified for study enrollment and has signed informed consent. Pt was randomized to study group: Standard of care/clinic visits  Pt is at BP goal of < 140/90 mmHg.  Continue to work on limiting salt intake, incorporating DASH diet, increasing exercise, and weight loss.  Continue all medicines as prescribed Continue care with your PCP and Dr. Acie Fredrickson  Future visits may consider increasing dose of amlodipine or addition of low dose carvedilol if additional BP lowering is needed.  Will need to closely monitor for tolerability.  Also focus on lifestyle recommendations and good adherence.  Drucie Opitz, PharmD Clinical Pharmacy Resident

## 2015-04-08 ENCOUNTER — Ambulatory Visit: Payer: Commercial Managed Care - PPO | Admitting: Pharmacist

## 2015-04-24 ENCOUNTER — Encounter: Payer: Self-pay | Admitting: Internal Medicine

## 2015-04-24 ENCOUNTER — Ambulatory Visit: Payer: Commercial Managed Care - PPO | Admitting: Internal Medicine

## 2015-04-24 ENCOUNTER — Ambulatory Visit (INDEPENDENT_AMBULATORY_CARE_PROVIDER_SITE_OTHER): Payer: Commercial Managed Care - PPO | Admitting: Internal Medicine

## 2015-04-24 VITALS — BP 124/84 | HR 84 | Ht 70.0 in | Wt 206.0 lb

## 2015-04-24 DIAGNOSIS — J4520 Mild intermittent asthma, uncomplicated: Secondary | ICD-10-CM | POA: Diagnosis not present

## 2015-04-24 DIAGNOSIS — J309 Allergic rhinitis, unspecified: Secondary | ICD-10-CM | POA: Diagnosis not present

## 2015-04-24 DIAGNOSIS — J3089 Other allergic rhinitis: Secondary | ICD-10-CM

## 2015-04-24 DIAGNOSIS — J302 Other seasonal allergic rhinitis: Secondary | ICD-10-CM

## 2015-04-24 NOTE — Assessment & Plan Note (Signed)
Very well controlled w/o needing medical intervention over the last year

## 2015-04-24 NOTE — Progress Notes (Signed)
Subjective:    Patient ID: Ricky Sharp, male    DOB: 05-Feb-1961, 54 y.o.   MRN: [id number]  HPI 04/14/11-50 yoM never smoker followecd for asthma and allergic rhinitis, complicated by GERD.  Last here 11/25/10 for acute visit. Reports no particular problems since then except this year Spring pollen season has been worse. Eyes burn and water, nasal congestion, choking sensation. Denies chest tightness or wheeze. Mucus is yellow green with no fever or obvious infection. Has used saline rinse and antihistamines every few days. . Not using the nasal steroid or Astepro. Says with hx of reflux, he feels better if he minimizes meds, especially inhaled steroids.  Continues allergy vaccine at 1:10 GO.   04/19/12- 51 yoM never smoker followecd for asthma and allergic rhinitis, complicated by GERD Seasonal flare up-watery eyes, slight itch, and sneezing with drainage; still on vaccine and doing well. Perennial and seasonal rhinitis. Vaccine is sufficient. He rarely needs antihistamines but was interested we discussed nasal sprays. No wheezing.  04/22/13- 12 yoM never smoker followed for asthma and allergic rhinitis, complicated by GERD FOLLOWS FOR: remains on allergy vaccine 1:10 GO and doing well.  Ues Nasalcrom and saline rinse. Avoids antihistamines as much as possible. Only wheezes when he has an infection. Medications reviewed.  04/22/14- 6 yoM never smoker followed for asthma and allergic rhinitis, complicated by GERD FOLLOWS FOR: had flare up when trees were blooming-PND and watery eyes.  Still on allergy vaccine 1:10 GO Satisfied w/ care. Used Dymista, Nasalcrom during peak season in April. Uses Patanol occasionally.  04/24/15- 40 yoM never smoker followed for asthma and allergic rhinitis, complicated by GERD Follows For: Pt stays symptoms are staying under control well.  Pt c/o sinus drainage, light burning and redness in eyes. Pt states dymista seems to be helping. Still on allergy vaccine  1:10 GO- doing well and intends to continue. No asthma at all- no use of rescue inhaler  ROS-see HPI Constitutional:   No-   weight loss, night sweats, fevers, chills, fatigue, lassitude. HEENT:   No-  headaches, difficulty swallowing, tooth/dental problems, sore throat,     No-sneezing, itching, ear ache, +nasal congestion, post nasal drip,  CV:  No-   chest pain, orthopnea, PND, swelling in lower extremities, anasarca, dizziness, palpitations Resp: No-   shortness of breath with exertion or at rest.              No-   productive cough,  No non-productive cough,  No- coughing up of blood.              No-   change in color of mucus.  No- wheezing.   Skin: No-   rash or lesions. GI:  No-   heartburn, indigestion, abdominal pain, nausea, vomiting, GU:  MS:  No-   joint pain or swelling.   Neuro-     nothing unusual Psych:  No- change in mood or affect. No depression or anxiety.  No memory loss.  OBJ- Physical Exam General- Alert, Oriented, Affect-appropriate, Distress- none acute Skin- rash-none, lesions- none, excoriation- none Lymphadenopathy- none Head- atraumatic            Eyes- Gross vision intact, PERRLA, watery+conjunctivae and secretions clear.             Ears- Hearing, canals-normal            Nose- Clear, no-Septal dev, mucus, polyps, erosion, perforation             Throat- Mallampati  III-IV , mucosa clear , drainage- none, tonsils- atrophic Neck- flexible , trachea midline, no stridor , thyroid nl, carotid no bruit Chest - symmetrical excursion , unlabored           Heart/CV- RRR , no murmur , no gallop  , no rub, nl s1 s2                           - JVD- none , edema- none, stasis changes- none, varices- none           Lung- clear to P&A, wheeze- none, cough- none , dullness-none, rub- none           Chest wall-  Abd-  Br/ Gen/ Rectal- Not done, not indicated Extrem- cyanosis- none, clubbing, none, atrophy- none, strength- nl Neuro- grossly intact to  observation

## 2015-04-24 NOTE — Patient Instructions (Addendum)
Please call as needed  We can continue present meds, and continue allergy vaccine 1:10 GO

## 2015-04-24 NOTE — Assessment & Plan Note (Signed)
Continues allergy vaccine which he still feels helps him, with no problems. We discussed risk benefit and will continue

## 2015-04-25 ENCOUNTER — Other Ambulatory Visit: Payer: Self-pay | Admitting: Internal Medicine

## 2015-05-19 ENCOUNTER — Other Ambulatory Visit: Payer: Commercial Managed Care - PPO | Admitting: Internal Medicine

## 2015-05-19 DIAGNOSIS — E039 Hypothyroidism, unspecified: Secondary | ICD-10-CM

## 2015-05-19 LAB — TSH: TSH: 1.891 u[IU]/mL (ref 0.350–4.500)

## 2015-05-21 ENCOUNTER — Encounter: Payer: Self-pay | Admitting: Internal Medicine

## 2015-05-21 ENCOUNTER — Ambulatory Visit (INDEPENDENT_AMBULATORY_CARE_PROVIDER_SITE_OTHER): Payer: Commercial Managed Care - PPO | Admitting: Internal Medicine

## 2015-05-21 VITALS — BP 150/86 | HR 77 | Temp 98.1°F | Wt 206.0 lb

## 2015-05-21 DIAGNOSIS — I10 Essential (primary) hypertension: Secondary | ICD-10-CM | POA: Diagnosis not present

## 2015-05-21 DIAGNOSIS — F411 Generalized anxiety disorder: Secondary | ICD-10-CM

## 2015-05-21 DIAGNOSIS — E039 Hypothyroidism, unspecified: Secondary | ICD-10-CM | POA: Diagnosis not present

## 2015-05-21 DIAGNOSIS — R030 Elevated blood-pressure reading, without diagnosis of hypertension: Secondary | ICD-10-CM

## 2015-05-21 DIAGNOSIS — IMO0001 Reserved for inherently not codable concepts without codable children: Secondary | ICD-10-CM

## 2015-05-21 NOTE — Progress Notes (Signed)
   Subjective:    Patient ID: Ricky Sharp, male    DOB: 1961/10/13, 54 y.o.   MRN: [id number]  HPI  His mother was in a nursing facility recently after having a bout of pneumonia and MRSA. Had history of asthma and chronic lung disease and was on chronic oxygen therapy. Probably had respiratory arrest. History of pacemaker after syncopal episode years ago. She expired in April. He is dealing with this very well. Father still living with history of heart disease.  Is tired but feeling well. 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  History of hypothyroidism. Blood pressure is elevated today but it may be due to anxiety. History of labile/office hypertension.  TSH is within normal limits.  Still has work stress and some situational stress at home.    Review of Systems     Objective:   Physical Exam  Spent 20 minutes speaking with patient about all of these issues. No thyromegaly.      Assessment & Plan:  Hypothyroidism  Essential hypertension-he saw Dr. Acie Fredrickson in April and his blood pressure was acceptable there.  History of asthma  History of anxiety-stable  Plan: Needs to have repeat blood pressure check in the near future. Continue same dose of thyroid replacement. Due for physical exam in 6 months.

## 2015-05-21 NOTE — Patient Instructions (Addendum)
Keep record of blood pressures at home. Call if persistently elevated. Return for blood pressure check in 4 weeks without office visit. Continue same dose of thyroid replacement.

## 2015-07-20 ENCOUNTER — Telehealth: Payer: Self-pay | Admitting: *Deleted

## 2015-07-20 NOTE — Telephone Encounter (Signed)
See this week.

## 2015-07-20 NOTE — Telephone Encounter (Signed)
Patient still having Fatigue and is falling asleep at work states he wants to know if you want to see him or refer him somewhere else for evaluation.Please advise.

## 2015-07-20 NOTE — Telephone Encounter (Signed)
Left message for patient to call back and schedule app for this week

## 2015-07-21 ENCOUNTER — Ambulatory Visit (INDEPENDENT_AMBULATORY_CARE_PROVIDER_SITE_OTHER): Payer: Commercial Managed Care - PPO | Admitting: Internal Medicine

## 2015-07-21 ENCOUNTER — Encounter: Payer: Self-pay | Admitting: Internal Medicine

## 2015-07-21 VITALS — BP 144/88 | HR 75 | Temp 97.8°F | Wt 208.0 lb

## 2015-07-21 DIAGNOSIS — R5383 Other fatigue: Secondary | ICD-10-CM

## 2015-07-21 DIAGNOSIS — G4719 Other hypersomnia: Secondary | ICD-10-CM | POA: Diagnosis not present

## 2015-07-21 LAB — COMPREHENSIVE METABOLIC PANEL
ALT: 37 U/L (ref 9–46)
AST: 23 U/L (ref 10–35)
Albumin: 4.4 g/dL (ref 3.6–5.1)
Alkaline Phosphatase: 84 U/L (ref 40–115)
BILIRUBIN TOTAL: 0.5 mg/dL (ref 0.2–1.2)
BUN: 19 mg/dL (ref 7–25)
CO2: 25 mmol/L (ref 20–31)
Calcium: 9.1 mg/dL (ref 8.6–10.3)
Chloride: 105 mmol/L (ref 98–110)
Creat: 1.1 mg/dL (ref 0.70–1.33)
Glucose, Bld: 102 mg/dL — ABNORMAL HIGH (ref 65–99)
Potassium: 3.9 mmol/L (ref 3.5–5.3)
SODIUM: 139 mmol/L (ref 135–146)
TOTAL PROTEIN: 7 g/dL (ref 6.1–8.1)

## 2015-07-21 LAB — CBC WITH DIFFERENTIAL/PLATELET
BASOS ABS: 0.1 10*3/uL (ref 0.0–0.1)
BASOS PCT: 1 % (ref 0–1)
Eosinophils Absolute: 0.2 10*3/uL (ref 0.0–0.7)
Eosinophils Relative: 3 % (ref 0–5)
HEMATOCRIT: 45.9 % (ref 39.0–52.0)
Hemoglobin: 16.1 g/dL (ref 13.0–17.0)
LYMPHS ABS: 1.9 10*3/uL (ref 0.7–4.0)
LYMPHS PCT: 31 % (ref 12–46)
MCH: 31 pg (ref 26.0–34.0)
MCHC: 35.1 g/dL (ref 30.0–36.0)
MCV: 88.3 fL (ref 78.0–100.0)
MONO ABS: 0.5 10*3/uL (ref 0.1–1.0)
MONOS PCT: 8 % (ref 3–12)
MPV: 9.7 fL (ref 8.6–12.4)
NEUTROS PCT: 57 % (ref 43–77)
Neutro Abs: 3.4 10*3/uL (ref 1.7–7.7)
Platelets: 264 10*3/uL (ref 150–400)
RBC: 5.2 MIL/uL (ref 4.22–5.81)
RDW: 13.7 % (ref 11.5–15.5)
WBC: 6 10*3/uL (ref 4.0–10.5)

## 2015-07-21 LAB — TSH: TSH: 1.708 u[IU]/mL (ref 0.350–4.500)

## 2015-07-21 LAB — T4, FREE: FREE T4: 1.13 ng/dL (ref 0.80–1.80)

## 2015-07-21 NOTE — Telephone Encounter (Signed)
Patient given an appt to be evaluated

## 2015-07-22 ENCOUNTER — Telehealth: Payer: Self-pay | Admitting: *Deleted

## 2015-07-22 DIAGNOSIS — R5383 Other fatigue: Secondary | ICD-10-CM

## 2015-07-22 NOTE — Telephone Encounter (Signed)
Spoke with patient reviewed recent lab results. Patient to be referred to Dr [name] Neurology for evaluation

## 2015-07-22 NOTE — Telephone Encounter (Signed)
Referral placed for patient to see Dr [name].

## 2015-07-24 NOTE — Progress Notes (Signed)
   Subjective:    Patient ID: Ricky Sharp, male    DOB: December 01, 1961, 54 y.o.   MRN: [id number]  HPI Patient is here today to discuss excessive daytime sleepiness. Now for several months he's had episodes of falling asleep at work in his office. It is hard to wake up. He feels groggy. Wife tells him he snores. He's never had a sleep study. Wife was recently diagnosed with sleep apnea. His mother died a few weeks ago. He is wrapping up the estate. Denies being depressed. History of anxiety and labile hypertension. He is on Lexapro 10 mg daily, when necessary Xanax, amlodipine and HCTZ. He has a history of hypothyroidism. Lab work will be checked today. Marriage has been under stress; and, he and his wife are in counseling for well over a year but are taking a break at the present time. Youngest child going off to college at [employer] this weekend. Oldest daughter in college at [place]. Children are doing well. Wife is an Forensic psychologist.    Review of Systems     Objective:   Physical Exam Skin warm and dry. Nodes none. Chest clear. Cardiac exam regular rate and rhythm. Extremities without edema. Neuro: Cranial nerves II through XII grossly intact. Muscle strength is normal. No focal deficits on brief neurological exam. Denies being significantly depressed. Affect is normal.       Assessment & Plan:  Excessive daytime sleepiness  Fatigue  Labile hypertension-treated by cardiologist  Anxiety depression treated with when necessary Xanax and low-dose Lexapro  Plan: Lab work will be checked including free T4 and TSH. Refer to Dr. [name]  for evaluation. May need sleep study. Evaluate for possible narcolepsy.  25 minutes spent with patient

## 2015-07-24 NOTE — Patient Instructions (Addendum)
Point with Dr. [name] for evaluation of excessive daytime sleepiness. Labs pending

## 2015-07-31 ENCOUNTER — Encounter: Payer: Self-pay | Admitting: Internal Medicine

## 2015-08-09 ENCOUNTER — Other Ambulatory Visit: Payer: Self-pay | Admitting: Internal Medicine

## 2015-08-20 ENCOUNTER — Ambulatory Visit (INDEPENDENT_AMBULATORY_CARE_PROVIDER_SITE_OTHER): Payer: Commercial Managed Care - PPO | Admitting: Neurology

## 2015-08-20 ENCOUNTER — Encounter: Payer: Self-pay | Admitting: Neurology

## 2015-08-20 VITALS — BP 143/91 | HR 71 | Resp 20 | Ht 70.0 in | Wt 208.5 lb

## 2015-08-20 DIAGNOSIS — R5382 Chronic fatigue, unspecified: Secondary | ICD-10-CM

## 2015-08-20 DIAGNOSIS — J301 Allergic rhinitis due to pollen: Secondary | ICD-10-CM

## 2015-08-20 DIAGNOSIS — R0683 Snoring: Secondary | ICD-10-CM

## 2015-08-20 DIAGNOSIS — G4710 Hypersomnia, unspecified: Secondary | ICD-10-CM | POA: Diagnosis not present

## 2015-08-20 DIAGNOSIS — G4730 Sleep apnea, unspecified: Secondary | ICD-10-CM

## 2015-08-20 NOTE — Patient Instructions (Signed)
Polysomnography (Sleep Studies) Polysomnography (PSG) is a series of tests used for detecting (diagnosing) obstructive sleep apnea and other sleep disorders. The tests measure how some parts of your body are working while you are sleeping. The tests are extensive and expensive. They are done in a sleep lab or hospital, and vary from center to center. Your caregiver may perform other more simple sleep studies and questionnaires before doing more complete and involved testing. Testing may not be covered by insurance. Some of these tests are:  An EEG (Electroencephalogram). This tests your brain waves and stages of sleep.  An EOG (Electrooculogram). This measures the movements of your eyes. It detects periods of REM (rapid eye movement) sleep, which is your dream sleep.  An EKG (Electrocardiogram). This measures your heart rhythm.  EMG (Electromyography). This is a measurement of how the muscles are working in your upper airway and your legs while sleeping.  An oximetry measurement. It measures how much oxygen (air) you are getting while sleeping.  Breathing efforts may be measured. The same test can be interpreted (understood) differently by different caregivers and centers that study sleep.  Studies may be given an apnea/hypopnea index (AHI). This is a number which is found by counting the times of no breathing or under breathing during the night, and relating those numbers to the amount of time spent in bed. When the AHI is greater than 15, the patient is likely to complain of daytime sleepiness. When the AHI is greater than 30, the patient is at increased risk for heart problems and must be followed more closely. Following the AHI also allows you to know how treatment is working. Simple oximetry (tracking the amount of oxygen that is taken in) can be used for screening patients who:  Do not have symptoms (problems) of OSA.  Have a normal Epworth Sleepiness Scale Score.  Have a low pre-test  probability of having OSA.  Have none of the upper airway problems likely to cause apnea.  Oximetry is also used to determine if treatment is effective in patients who showed significant desaturations (not getting enough oxygen) on their home sleep study. One extra measure of safety is to perform additional studies for the person who only snores. This is because no one can predict with absolute certainty who will have OSA. Those who show significant desaturations (not getting enough oxygen) are recommended to have a more detailed sleep study. Document Released: 06/04/2003 Document Revised: 02/20/2012 Document Reviewed: 02/03/2014 ExitCare Patient Information 2015 [employer]. This information is not intended to replace advice given to you by your health care provider. Make sure you discuss any questions you have with your health care provider.  

## 2015-08-20 NOTE — Progress Notes (Signed)
SLEEP MEDICINE CLINIC   Provider:  Larey Seat, M D  Referring Provider: Elby Showers, MD Primary Care Physician:  Elby Showers, MD  Chief Complaint  Patient presents with  . Referral    internal referral   Chief complaint according to patient : "I am fatigued and sleepy "   HPI:  Ricky Sharp is a 54 y.o. male , seen here as a referral from Dr. Renold Genta for a sleep evaluation,  Mr. Tadros reports that he has been a light sleeper most of his life. 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 Both parents in law have died in 03-17-2013 and then in 03/17/14 and just this may his mother died. He has 2 kids of college age. He feels that he has been chronically fatigued unrelenting the fatigue to a disabling degree. This is partially reflected in today's questionnaires. He endorsed the fatigue severity score at 53 points and the Epworth sleepiness score at 9 points. Part of what made him seek a sleep specialist opinion was that his wife was evaluated and diagnosed with obstructive sleep apnea and is now on CPAP. In the process of  an internal medicine workup for fatigue Dr. Renold Genta also ordered multiple metabolic panels. The patient learned that he had a thyroid dysfunction.hypo-thyroidism.  He regularly takes a multivitamin but he was not asked to shore up on vitamin D or B12 supplements. He was also not told if he is anemic or not. I reviewed the lab results from December, February and again from last August and his TSH  level has significantly come down. No anemia .He has frequent nasal allergies.   Sleep habits are as follows: The patient usually goes to bed between 10 and 11 and rises at 5 in the morning. He has always been a light sleeper as mentioned before and usually is awake before the alarm rings. A good night would be 6 hours of sleep most  nights he will accumulate about 4-1/2-5 hours of sleep. This has been going on for over a decade. He sleeps on his side, bedroom is cool and quiet and dark. His wife likes to watch TV in the bedroom.  Typically he would not have considered himself and nap time. However over the increasing fatigue over the last 12-24 month especially, he suffers from the irresistible urge to go to sleep.   Sleep medical history and family sleep history:  Not known.   Social history: married, working regular office hours, non smoker, socially  ETOH drinker , caffeine 2 espresso daily.    Review of Systems: Out of a complete 14 system review, the patient complains of only the following symptoms, and all other reviewed systems are negative.   Epworth score 9 , Fatigue severity score 53   , depression score n/a, anxiety - has side effects to all SSRI until low dose lexapro was used.    Social History   Social History  . Marital Status: Married    Spouse Name: N/A  . Number of Children: N/A  . Years of Education: N/A   Occupational History  . network admin    Social History Main Topics  . Smoking status: Never Smoker   . Smokeless tobacco: Not on file  . Alcohol Use: No  . Drug Use: No  . Sexual Activity: Not on file  Other Topics Concern  . Not on file   Social History Narrative    Family History  Problem Relation Age of Onset  . Adopted: Yes  . Fibromyalgia Mother   . Syncope episode Mother   . Lung disease Mother     Past Medical History  Diagnosis Date  . Asthma   . Allergic rhinitis   . GERD (gastroesophageal reflux disease)   . Hypertension     Past Surgical History  Procedure Laterality Date  . Cholecystectomy    . Tonsillectomy    . Osteochondroma excision      Current Outpatient Prescriptions  Medication Sig Dispense Refill  . ALPRAZolam (XANAX) 0.25 MG tablet Take 1 tablet (0.25 mg total) by mouth 2 (two) times daily as needed for anxiety. 60 tablet 0  .  amLODipine (NORVASC) 5 MG tablet Take 1 tablet (5 mg total) by mouth daily. 31 tablet 11  . CARAFATE 1 GM/10ML suspension Take by mouth as directed.     . cromolyn (NASALCROM) 5.2 MG/ACT nasal spray Place 1 spray into the nose 4 (four) times daily.      Marland Kitchen dexlansoprazole (DEXILANT) 60 MG capsule Take 60 mg by mouth daily as needed.     Marland Kitchen DYMISTA 137-50 MCG/ACT SUSP USE 1-2 SPRAYS IN EACH NOSTRIL AT BEDTIME AS DIRECTED 23 g PRN  . EPINEPHrine (EPIPEN JR) 0.15 MG/0.3 ML injection Inject into thigh if needed for severe allergic reavtion 1 each prn  . escitalopram (LEXAPRO) 10 MG tablet Take 0.5 tablets (5 mg total) by mouth daily. 30 tablet 5  . hydrochlorothiazide (HYDRODIURIL) 25 MG tablet TAKE 1 TABLET DAILY. 30 tablet 6  . ibuprofen (ADVIL,MOTRIN) 200 MG tablet Take 200 mg by mouth every 6 (six) hours as needed.      Marland Kitchen levothyroxine (SYNTHROID, LEVOTHROID) 75 MCG tablet TAKE 1 TABLET ONCE DAILY. 34 tablet 2  . Multiple Vitamins-Minerals (CENTRUM SILVER) tablet Take 1 tablet by mouth daily.    . NONFORMULARY OR COMPOUNDED ITEM Allergy Vaccine 1:10 Given at Home    . olopatadine (PATANOL) 0.1 % ophthalmic solution Place 1 drop into both eyes 2 (two) times daily. As needed 5 mL prn  . Omega-3 Fatty Acids (FISH OIL) 1000 MG CAPS Take 1 capsule by mouth every evening.    . ORACEA 40 MG capsule     . potassium chloride (K-DUR) 10 MEQ tablet Take 10 mEq by mouth daily. Take 2 tablet in the AM and 2 tablets in the PM.    . triamcinolone cream (KENALOG) 0.1 % Apply 1 application topically 2 (two) times daily. 45 g 2   No current facility-administered medications for this visit.    Allergies as of 08/20/2015 - Review Complete 08/20/2015  Allergen Reaction Noted  . Lisinopril  01/01/2015  . Losartan  09/11/2014    Vitals: BP 143/91 mmHg  Pulse 71  Resp 20  Ht 5\' 10"  (1.778 m)  Wt 208 lb 8 oz (94.575 kg)  BMI 29.92 kg/m2 Last Weight:  Wt Readings from Last 1 Encounters:  08/20/15 208 lb 8  oz (94.575 kg)   OIB:BCWU mass index is 29.92 kg/(m^2).     Last Height:   Ht Readings from Last 1 Encounters:  08/20/15 5\' 10"  (1.778 m)    Physical exam:  General: The patient is awake, alert and appears not in acute distress. The patient is well groomed. Head: Normocephalic, atraumatic. Neck is supple. Mallampati 4, uvula is not visible even with use of tongue depression.  neck circumference: 17 . Nasal airflow unrestricted , TMJ is not evident . Retrognathia is seen.  Cardiovascular:  Regular rate and rhythm , without  murmurs or carotid bruit, and without distended neck veins. Respiratory: Lungs are clear to auscultation. Skin:  Without evidence of edema, or rash Trunk: BMI is elevated .  The patient's posture is erect    Neurologic exam : The patient is awake and alert, oriented to place and time.   Memory subjective described as intact.  Attention span & concentration ability appears normal.  Speech is fluent,  without dysarthria, dysphonia or aphasia.  Mood and affect are appropriate.  Cranial nerves: Reports loss of smell, but not of taste. Pupils are equal and briskly reactive to light. Funduscopic exam without  evidence of pallor or edema.  Extraocular movements  in vertical and horizontal planes intact and without nystagmus. Visual fields by finger perimetry are intact. Hearing to finger rub intact.   Facial sensation intact to fine touch.  Facial motor strength is symmetric and tongue and uvula move midline. Shoulder shrug was symmetrical.   Motor exam:   Normal tone, muscle bulk and symmetric strength in all extremities.  Sensory:  Fine touch, pinprick and vibration were tested in all extremities. Proprioception tested in the upper extremities was normal.  Coordination: Rapid alternating movements in the fingers/hands was normal. Finger-to-nose maneuver  normal without evidence of ataxia, dysmetria or tremor.  Gait and station: Patient walks without assistive device  and is able unassisted to climb up to the exam table. Strength within normal limits.  Stance is stable and normal. Toe and heel stand were tested .Tandem gait is unfragmented. Turns with  3 Steps. Romberg testing is  negative.  Deep tendon reflexes: in the  upper and lower extremities are symmetric and intact. Babinski maneuver response is downgoing.  The patient was advised of the nature of the diagnosed sleep disorder , the treatment options and risks for general a health and wellness arising from not treating the condition.  I spent more than 35  minutes of face to face time with the patient. Greater than 50% of time was spent in counseling and coordination of care. We have discussed the diagnosis and differential and I answered the patient's questions.     Assessment:  After physical and neurologic examination, review of laboratory studies,  Personal review of imaging studies, reports of other /same  Imaging studies ,  Results of polysomnography/ neurophysiology testing and pre-existing records as far as provided in visit., my assessment is   1) the severe fatigue that the patient reports could still be an aftermath of the thyroid disease. However I am strongly suspicious that the patient will have obstructive sleep apnea or at least upper airway resistance he. He has a very narrow upper airway with a Mallampati grade 4, he does have a crowded lower jaw that is slightly hypoplastic, creating retrognathia. His spouse has already told him that he snores but she was not explicit about having witnessed apneas. the patient has already used a pulse oximetry which noted no significant desaturations. This was not a medically ordered procedure but a home test device.  2) I like for the patient also to take extra vitamin B12 or vitamin B complex and vitamin D. The supplements can help to overcome fatigue. I will not order specific blood tests for deficiencies as these will most likely be have to be covered  by the patient himself.  3) I will order a attended polysomnography  as a split-night study. I explained that he will sleep for about 2 hours to make the diagnosis the rest of the night can be used to treat the apnea. If his insurance carrier will not permit and attended sleep study and insists on a home sleep test he may have to return later for a titration. He does not report morning headaches and he has no underlying cardiac or lung disease so  capnography will not be necessary.    Plan:  Treatment plan and additional workup :     Larey Seat MD  08/20/2015   CC: Elby Showers, Md [address] [place], [place] [address]

## 2015-09-03 ENCOUNTER — Ambulatory Visit (INDEPENDENT_AMBULATORY_CARE_PROVIDER_SITE_OTHER): Payer: Commercial Managed Care - PPO | Admitting: Internal Medicine

## 2015-09-03 DIAGNOSIS — Z23 Encounter for immunization: Secondary | ICD-10-CM

## 2015-09-11 ENCOUNTER — Other Ambulatory Visit: Payer: Self-pay | Admitting: Cardiovascular Disease

## 2015-09-24 ENCOUNTER — Ambulatory Visit (INDEPENDENT_AMBULATORY_CARE_PROVIDER_SITE_OTHER): Payer: Commercial Managed Care - PPO | Admitting: Neurology

## 2015-09-24 DIAGNOSIS — G4730 Sleep apnea, unspecified: Secondary | ICD-10-CM

## 2015-09-24 DIAGNOSIS — G4710 Hypersomnia, unspecified: Secondary | ICD-10-CM

## 2015-09-24 DIAGNOSIS — R0683 Snoring: Secondary | ICD-10-CM

## 2015-09-24 DIAGNOSIS — R5382 Chronic fatigue, unspecified: Secondary | ICD-10-CM

## 2015-09-24 DIAGNOSIS — J301 Allergic rhinitis due to pollen: Secondary | ICD-10-CM

## 2015-09-25 NOTE — Sleep Study (Signed)
Please see the scanned sleep study interpretation located in the procedure tab in the chart view section.  

## 2015-09-28 ENCOUNTER — Ambulatory Visit (INDEPENDENT_AMBULATORY_CARE_PROVIDER_SITE_OTHER): Payer: Commercial Managed Care - PPO

## 2015-09-28 ENCOUNTER — Telehealth: Payer: Self-pay | Admitting: Internal Medicine

## 2015-09-28 DIAGNOSIS — J309 Allergic rhinitis, unspecified: Secondary | ICD-10-CM

## 2015-09-28 NOTE — Telephone Encounter (Signed)
Allergy Serum Extract Date Mixed: 09/28/15 Vial: 1 Strength: 1:10 Here/Mail/Pick Up: mail Mixed By: Laurette Schimke

## 2015-09-30 ENCOUNTER — Telehealth: Payer: Self-pay | Admitting: Internal Medicine

## 2015-09-30 ENCOUNTER — Encounter: Payer: Self-pay | Admitting: Internal Medicine

## 2015-09-30 ENCOUNTER — Ambulatory Visit (INDEPENDENT_AMBULATORY_CARE_PROVIDER_SITE_OTHER): Payer: Commercial Managed Care - PPO | Admitting: Internal Medicine

## 2015-09-30 VITALS — BP 140/98 | HR 82 | Temp 98.5°F | Wt 209.0 lb

## 2015-09-30 DIAGNOSIS — J069 Acute upper respiratory infection, unspecified: Secondary | ICD-10-CM

## 2015-09-30 MED ORDER — LEVOFLOXACIN 500 MG PO TABS: 500.0000 mg | ORAL_TABLET | Freq: Every day | ORAL | Status: DC

## 2015-09-30 MED ORDER — METHYLPREDNISOLONE ACETATE 80 MG/ML IJ SUSP
80.0000 mg | Freq: Once | INTRAMUSCULAR | Status: AC
Start: 2015-09-30 — End: 2015-09-30
  Administered 2015-09-30: 80 mg via INTRAMUSCULAR

## 2015-09-30 MED ORDER — HYDROCODONE-HOMATROPINE 5-1.5 MG/5ML PO SYRP: 5.0000 mL | ORAL_SOLUTION | Freq: Four times a day (QID) | ORAL | Status: DC | PRN

## 2015-09-30 NOTE — Telephone Encounter (Signed)
Patient calls stating that he has been sick since Friday.  Caught URI from his daughter.  Fever comes and goes.  Taking Tylenol.  Has cough/congestion (green sputum).  Wants to know if he can be worked in.  States you usually give him a shot about this time of year and it wards this off.  Advised that I would have to wait until you come in today and give him a call back.    Please advise and thanks.

## 2015-09-30 NOTE — Progress Notes (Signed)
   Subjective:    Patient ID: Ricky Sharp, male    DOB: 1961-04-15, 54 y.o.   MRN: [id number]  HPI Daughter came over from college in [place] with respiratory infection. He came down with symptoms within 48 hours of her visit. He has slight sore throat and slight wheeze when he coughs. No fever or shaking chills. Has nasal congestion, malaise and fatigue.  Had sleep apnea study recently with results pending.    Review of Systems     Objective:   Physical Exam Left TM obscured by cerumen. Right TM clear. Pharynx slightly injected. Neck is supple. Chest clear to auscultation without rales or wheezing       Assessment & Plan:  Acute URI  Plan: Levaquin 500 milligrams daily for 10 days. Hycodan 1 teaspoon by mouth every 6 hours when necessary cough. Depo-Medrol 80 mg IM.

## 2015-09-30 NOTE — Telephone Encounter (Signed)
See today

## 2015-09-30 NOTE — Patient Instructions (Signed)
Depo-Medrol given today. Take Levaquin 500 milligrams daily for 10 days. Take Hycodan sparingly for cough.

## 2015-09-30 NOTE — Telephone Encounter (Signed)
Patient will be seen per Dr Renold Genta.

## 2015-10-01 ENCOUNTER — Telehealth: Payer: Self-pay

## 2015-10-01 NOTE — Telephone Encounter (Signed)
Spoke to pt regarding his sleep study. Advised him that mild osa and loud snoring was seen in his study. PAP therapy is indicated due to REM accentuation and Dr. [name] recommends proceeding with PAP titration, but she mentions that alternate therapies may include an oral appliance or ENT evaluation.  Pt wishes to come in and discuss results further with Dr. [name] before making any decisions on treatment. He says that it "sounds like I don't hae bad apnea, and my oxygen didn't drop low for a long amount of time". He wants to discuss the oral appliance option versus the cpap.  I made an appt with the pt on 10/26 at 11:45. Pt verbalized understanding.

## 2015-10-07 ENCOUNTER — Encounter: Payer: Self-pay | Admitting: Neurology

## 2015-10-07 ENCOUNTER — Ambulatory Visit (INDEPENDENT_AMBULATORY_CARE_PROVIDER_SITE_OTHER): Payer: Commercial Managed Care - PPO | Admitting: Neurology

## 2015-10-07 VITALS — BP 152/90 | HR 76 | Resp 20 | Ht 70.5 in | Wt 206.0 lb

## 2015-10-07 DIAGNOSIS — R0683 Snoring: Secondary | ICD-10-CM

## 2015-10-07 DIAGNOSIS — G4733 Obstructive sleep apnea (adult) (pediatric): Secondary | ICD-10-CM | POA: Diagnosis not present

## 2015-10-07 DIAGNOSIS — G478 Other sleep disorders: Secondary | ICD-10-CM | POA: Diagnosis not present

## 2015-10-07 NOTE — Patient Instructions (Signed)
RV after 30 to  90 days of CPAP use.     CPAP and BIPAP Information CPAP and BIPAP are methods of helping you breathe with the use of air pressure. CPAP stands for "continuous positive airway pressure." BIPAP stands for "bi-level positive airway pressure." In both methods, air is blown into your air passages to help keep you breathing well. With CPAP, the amount of pressure stays the same while you breathe in and out. CPAP is most commonly used for obstructive sleep apnea. For obstructive sleep apnea, CPAP works by holding your airways open so that they do not collapse when your muscles relax during sleep. BIPAP is similar to CPAP except the amount of pressure is increased when you inhale. This helps you take larger breaths. Your health care provider will recommend whether CPAP or BIPAP would be more helpful for you.  WHY ARE CPAP AND BIPAP TREATMENTS USED? CPAP or BIPAP can be helpful if you have:   Sleep apnea.   Chronic obstructive pulmonary disease (COPD).   Diseases that weaken the muscles of the chest, including muscular dystrophy or neurological diseases such as amyotrophic lateral sclerosis (ALS).  Other problems that cause breathing to be weak, abnormal, or difficult.  HOW IS CPAP OR BIPAP ADMINISTERED? Both CPAP and BIPAP are provided by a small machine with a flexible plastic tube that attaches to a plastic mask. The mask fits on your face, and air is blown into your air passages through your nose or mouth. The amount of pressure that is used to blow the air into your air passages can be set on the machine. Your health care provider will determine the pressure setting that should be used based on your individual needs. WHEN SHOULD CPAP OR BIPAP BE USED? In most cases, the mask is worn only when sleeping. Generally, you will need to wear the mask throughout the night and during the daytime if you take a nap. In a few cases involving certain medical conditions, people also need to wear  the mask at other times when they are awake. Follow your health care provider's instructions for when to use the machine.  USING THE MASK  Because the mask needs to be snug, some people feel a trapped or closed-in feeling (claustrophobic) when first using the mask. You may need to get used to the mask gradually. To do this, you can first hold the mask loosely over your nose or mouth. Gradually apply the mask more snugly. You can also gradually increase the amount of time that you use the mask.  Masks are available in various types and sizes. Some fit over your mouth and nose, and some fit over just your nose. If your mask does not fit well, talk to your health care provider about getting a different one.  If you are using a nasal mask and you tend to breathe through your mouth, a chin strap may be applied to help keep your mouth closed.   The CPAP and BIPAP machines have alarms that may sound if the mask comes off or develops a leak.  If you have trouble with the mask, it is very important that you talk to your health care provider about finding a way to make the mask easier to tolerate. Do not stop using the mask. This could have a negative impact on your health. TIPS FOR USING THE MACHINE  Place your CPAP or BIPAP machine on a secure table or stand near an electrical outlet.   Know where  the on-off switch is located on the machine.  Follow your health care provider's instructions for how to set the pressure on your machine and when you should use it.  Do not eat or drink while the CPAP or BIPAP machine is on. Food or fluids could get pushed into your lungs by the pressure of the CPAP or BIPAP.  Do not smoke. Tobacco smoke residue can damage the machine.   For home use, CPAP and BIPAP machines can be rented or purchased through home health care companies. Many different brands of machines are available. Renting a machine before purchasing may help you find out which particular machine  works well for you. SEEK IMMEDIATE MEDICAL CARE IF:  You have redness or open areas around your nose or mouth where the mask fits.   You have trouble operating the CPAP or BIPAP machine.   You cannot tolerate wearing the CPAP or BIPAP mask.    This information is not intended to replace advice given to you by your health care provider. Make sure you discuss any questions you have with your health care provider.   Document Released: 08/26/2004 Document Revised: 12/19/2014 Document Reviewed: 06/27/2013 Elsevier Interactive Patient Education [employer].

## 2015-10-07 NOTE — Progress Notes (Signed)
SLEEP MEDICINE CLINIC   Provider:  Larey Seat, M D  Referring Provider: Elby Showers, MD Primary Care Physician:  Elby Showers, MD  Chief Complaint  Patient presents with  . Follow-up    f/u sleep study results, pt states that he might not need cpap because he does not sleep on his back like he did during his sleep study, reluctant to start cpap, rm 10, alone   Chief complaint according to patient : "I am fatigued and sleepy "   HPI:  Ricky Sharp is a 54 y.o. male , seen here as a referral from Dr. Renold Genta for a sleep evaluation,  Ricky Sharp reports that he has been a light sleeper most of his life. 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 Both parents in law have died in 2013-04-11 and then in Apr 11, 2014 and just this may his mother died. He has 2 kids of college age. He feels that he has been chronically fatigued unrelenting the fatigue to a disabling degree. This is partially reflected in today's questionnaires. He endorsed the fatigue severity score at 53 points and the Epworth sleepiness score at 9 points. Part of what made him seek a sleep specialist opinion was that his wife was evaluated and diagnosed with obstructive sleep apnea and is now on CPAP. In the process of  an internal medicine workup for fatigue Dr. Renold Genta also ordered multiple metabolic panels. The patient learned that he had a thyroid dysfunction.hypo-thyroidism.  He regularly takes a multivitamin but he was not asked to shore up on vitamin D or B12 supplements. He was also not told if he is anemic or not. I reviewed the lab results from December, February and again from last August and his TSH  level has significantly come down. No anemia .He has frequent nasal allergies.   1) the severe fatigue that the patient reports could still be an aftermath of the thyroid  disease. However I am strongly suspicious that the patient will have obstructive sleep apnea or at least upper airway resistance he. He has a very narrow upper airway with a Mallampati grade 4, he does have a crowded lower jaw that is slightly hypoplastic, creating retrognathia. His spouse has already told him that he snores but she was not explicit about having witnessed apneas. the patient has already used a pulse oximetry which noted no significant desaturations. This was not a medically ordered procedure but a home test device.  2) I like for the patient also to take extra vitamin B12 or vitamin B complex and vitamin D. The supplements can help to overcome fatigue. I will not order specific blood tests for deficiencies as these will most likely be have to be covered by the patient himself.  3) I will order a attended polysomnography as a split-night study. I explained that he will sleep for about 2 hours to make the diagnosis the rest of the night can be used to treat the apnea. If his insurance carrier will not permit and attended sleep study and insists on a home sleep test he may have to return later for a titration. He does not report morning headaches and he has no underlying cardiac or lung disease so  capnography will not be necessary.   Sleep habits are as follows: The patient usually goes to bed between 10 and  11 and rises at 5 in the morning. He has always been a light sleeper as mentioned before and usually is awake before the alarm rings. A good night would be 6 hours of sleep most nights he will accumulate about 4-1/2-5 hours of sleep. This has been going on for over a decade. He sleeps on his side, bedroom is cool and quiet and dark. His wife likes to watch TV in the bedroom.  Typically he would not have considered himself and nap time. However over the increasing fatigue over the last 12-24 month especially, he suffers from the irresistible urge to go to sleep. Sleep medical history  and family sleep history:  Not known.  Social history: married, working regular office hours, non smoker, socially  ETOH drinker , caffeine 2 espresso daily.   Interval history from 10-07-15. Ricky Sharp is seen here today after his recent sleep study dated 09-24-15. His polysomnography revealed mild apnea 15 obstructive apneas were seen and 43 shallow breathing spells the AHI was 10.6 the RDI 10.6 the REM AHI was 30.6. Nadir was 87% oxygenation but for only 6 meet minutes. Based on the outcome of the sleep study I feel that CPAP could be helpful in treating all observed sleep disorders mild apnea, snoring, upper airway resistance he syndrome. But the patient also has retrognathia and may actually improved to a significant degree by using a dental device such as a mandibular advancement device. He has advised me of a TMJ click at the right jaw but he is not in pain. It may be possible to treat bruxism and snoring with this device and not aggravate the TM joint.  Based on his degree of apnea can go either way CPAP or dental device. A dental device would be manufactured to the specification of the sleep lab in the treatment of a medical condition and therefore would run through his general health insurance and not through a dental insurance.  Will order auto CPAP. 5-12 cm water. Mask of patients choice.    Review of Systems: Out of a complete 14 system review, the patient complains of only the following symptoms, and all other reviewed systems are negative.   Epworth score 9 , Fatigue severity score 53   , depression score n/a, anxiety - has side effects to all SSRI until low dose lexapro was used.    Social History   Social History  . Marital Status: Married    Spouse Name: N/A  . Number of Children: N/A  . Years of Education: N/A   Occupational History  . network admin    Social History Main Topics  . Smoking status: Never Smoker   . Smokeless tobacco: Not on file  . Alcohol Use: No    . Drug Use: No  . Sexual Activity: Not on file   Other Topics Concern  . Not on file   Social History Narrative    Family History  Problem Relation Age of Onset  . Adopted: Yes  . Fibromyalgia Mother   . Syncope episode Mother   . Lung disease Mother     Past Medical History  Diagnosis Date  . Asthma   . Allergic rhinitis   . GERD (gastroesophageal reflux disease)   . Hypertension     Past Surgical History  Procedure Laterality Date  . Cholecystectomy    . Tonsillectomy    . Osteochondroma excision      Current Outpatient Prescriptions  Medication Sig Dispense Refill  . ALPRAZolam Duanne Moron)  0.25 MG tablet Take 1 tablet (0.25 mg total) by mouth 2 (two) times daily as needed for anxiety. 60 tablet 0  . amLODipine (NORVASC) 5 MG tablet Take 1 tablet (5 mg total) by mouth daily. 31 tablet 11  . CARAFATE 1 GM/10ML suspension Take by mouth as directed.     . cromolyn (NASALCROM) 5.2 MG/ACT nasal spray Place 1 spray into the nose 4 (four) times daily.      Marland Kitchen dexlansoprazole (DEXILANT) 60 MG capsule Take 60 mg by mouth daily as needed.     Marland Kitchen DYMISTA 137-50 MCG/ACT SUSP USE 1-2 SPRAYS IN EACH NOSTRIL AT BEDTIME AS DIRECTED 23 g PRN  . EPINEPHrine (EPIPEN JR) 0.15 MG/0.3 ML injection Inject into thigh if needed for severe allergic reavtion 1 each prn  . escitalopram (LEXAPRO) 10 MG tablet Take 0.5 tablets (5 mg total) by mouth daily. 30 tablet 5  . hydrochlorothiazide (HYDRODIURIL) 25 MG tablet Take 1 tablet (25 mg total) by mouth daily. 30 tablet 11  . HYDROcodone-homatropine (HYCODAN) 5-1.5 MG/5ML syrup Take 5 mLs by mouth every 6 (six) hours as needed for cough. 120 mL 0  . ibuprofen (ADVIL,MOTRIN) 200 MG tablet Take 200 mg by mouth every 6 (six) hours as needed.      Marland Kitchen levofloxacin (LEVAQUIN) 500 MG tablet Take 1 tablet (500 mg total) by mouth daily. 10 tablet 0  . levothyroxine (SYNTHROID, LEVOTHROID) 75 MCG tablet TAKE 1 TABLET ONCE DAILY. 34 tablet 2  . Multiple  Vitamins-Minerals (CENTRUM SILVER) tablet Take 1 tablet by mouth daily.    . NONFORMULARY OR COMPOUNDED ITEM Allergy Vaccine 1:10 Given at Home    . olopatadine (PATANOL) 0.1 % ophthalmic solution Place 1 drop into both eyes 2 (two) times daily. As needed 5 mL prn  . Omega-3 Fatty Acids (FISH OIL) 1000 MG CAPS Take 1 capsule by mouth every evening.    . ORACEA 40 MG capsule     . potassium chloride (K-DUR) 10 MEQ tablet Take 10 mEq by mouth daily. Take 2 tablet in the AM and 2 tablets in the PM.    . triamcinolone cream (KENALOG) 0.1 % Apply 1 application topically 2 (two) times daily. 45 g 2   No current facility-administered medications for this visit.    Allergies as of 10/07/2015 - Review Complete 10/07/2015  Allergen Reaction Noted  . Lisinopril  01/01/2015  . Losartan  09/11/2014    Vitals: BP 152/90 mmHg  Pulse 76  Resp 20  Ht 5' 10.5" (1.791 m)  Wt 206 lb (93.441 kg)  BMI 29.13 kg/m2 Last Weight:  Wt Readings from Last 1 Encounters:  10/07/15 206 lb (93.441 kg)   PHX:TAVW mass index is 29.13 kg/(m^2).     Last Height:   Ht Readings from Last 1 Encounters:  10/07/15 5' 10.5" (1.791 m)    Physical exam:  General: The patient is awake, alert and appears not in acute distress. The patient is well groomed. Head: Normocephalic, atraumatic. Neck is supple. Mallampati 4, uvula is not visible even with use of tongue depression. neck circumference: 17 . Nasal airflow unrestricted , TMJ is not evident . Retrognathia is seen.  Cardiovascular:  Regular rate and rhythm , without  murmurs or carotid bruit, and without distended neck veins. Respiratory: Lungs are clear to auscultation. Skin:  Without evidence of edema, or rash Trunk: BMI is elevated .  The patient's posture is erect    Neurologic exam : The patient is awake and alert, oriented  to place and time.   Memory subjective described as intact.  Attention span & concentration ability appears normal.  Speech is fluent,   without dysarthria, dysphonia or aphasia.  Mood and affect are appropriate.  Cranial nerves: Reports loss of smell, but not of taste. Pupils are equal and briskly reactive to light. Funduscopic exam without  evidence of pallor or edema.  Extraocular movements  in vertical and horizontal planes intact and without nystagmus. Visual fields by finger perimetry are intact. Hearing to finger rub intact.   Facial sensation intact to fine touch.  Facial motor strength is symmetric and tongue and uvula move midline. Shoulder shrug was symmetrical.  Motor exam:   Normal tone, muscle bulk and symmetric strength in all extremities. Deep tendon reflexes: in the  upper and lower extremities are symmetric and intact. Babinski maneuver response is downgoing.  The patient was advised of the nature of the diagnosed sleep disorder , the treatment options and risks for general a health and wellness arising from not treating the condition.  I spent more than 15 minutes of face to face time with the patient. Greater than 50% of time was spent in counseling and coordination of care. We have discussed the diagnosis and differential and I answered the patient's questions.    Assessment:  After physical and neurologic examination, review of laboratory studies,  Personal review of imaging studies, reports of other /same  Imaging studies ,  Results of polysomnography/ neurophysiology testing and pre-existing records as far as provided in visit., my assessment is   MIld apnea , snoring and UARS, retrognathia.   1) order  auto CPAP 5-12 cm water, mask of patient s choice. If he feels uncomfortable and/ or is intolerant of CPAP, will pursue  The dental device. Mandibular advancement therapy .   Plan:  Treatment plan and additional workup :   Larey Seat MD  10/07/2015   CC: Elby Showers, Md [address] [place], [place] [id number]

## 2015-10-19 ENCOUNTER — Encounter: Payer: Self-pay | Admitting: Internal Medicine

## 2015-11-17 ENCOUNTER — Other Ambulatory Visit: Payer: Self-pay | Admitting: Internal Medicine

## 2015-11-17 ENCOUNTER — Other Ambulatory Visit: Payer: Commercial Managed Care - PPO | Admitting: Internal Medicine

## 2015-11-17 DIAGNOSIS — Z0000 Encounter for general adult medical examination without abnormal findings: Secondary | ICD-10-CM

## 2015-11-17 DIAGNOSIS — E875 Hyperkalemia: Secondary | ICD-10-CM

## 2015-11-17 DIAGNOSIS — I10 Essential (primary) hypertension: Secondary | ICD-10-CM

## 2015-11-17 DIAGNOSIS — Z13220 Encounter for screening for lipoid disorders: Secondary | ICD-10-CM

## 2015-11-17 DIAGNOSIS — Z125 Encounter for screening for malignant neoplasm of prostate: Secondary | ICD-10-CM

## 2015-11-17 DIAGNOSIS — E039 Hypothyroidism, unspecified: Secondary | ICD-10-CM

## 2015-11-17 DIAGNOSIS — Z79899 Other long term (current) drug therapy: Secondary | ICD-10-CM

## 2015-11-17 LAB — COMPLETE METABOLIC PANEL WITH GFR
ALT: 46 U/L (ref 9–46)
AST: 28 U/L (ref 10–35)
Albumin: 4.4 g/dL (ref 3.6–5.1)
Alkaline Phosphatase: 71 U/L (ref 40–115)
BUN: 18 mg/dL (ref 7–25)
CHLORIDE: 103 mmol/L (ref 98–110)
CO2: 28 mmol/L (ref 20–31)
CREATININE: 1 mg/dL (ref 0.70–1.33)
Calcium: 8.9 mg/dL (ref 8.6–10.3)
GFR, Est Non African American: 84 mL/min (ref >=60)
Glucose, Bld: 88 mg/dL (ref 65–99)
POTASSIUM: 4 mmol/L (ref 3.5–5.3)
Sodium: 140 mmol/L (ref 135–146)
Total Bilirubin: 0.7 mg/dL (ref 0.2–1.2)
Total Protein: 6.8 g/dL (ref 6.1–8.1)

## 2015-11-17 LAB — CBC WITH DIFFERENTIAL/PLATELET
BASOS ABS: 0.1 10*3/uL (ref 0.0–0.1)
Basophils Relative: 1 % (ref 0–1)
EOS ABS: 0.2 10*3/uL (ref 0.0–0.7)
EOS PCT: 3 % (ref 0–5)
HCT: 45 % (ref 39.0–52.0)
Hemoglobin: 15.8 g/dL (ref 13.0–17.0)
Lymphocytes Relative: 25 % (ref 12–46)
Lymphs Abs: 1.4 10*3/uL (ref 0.7–4.0)
MCH: 30.6 pg (ref 26.0–34.0)
MCHC: 35.1 g/dL (ref 30.0–36.0)
MCV: 87.2 fL (ref 78.0–100.0)
MPV: 9.8 fL (ref 8.6–12.4)
Monocytes Absolute: 0.6 10*3/uL (ref 0.1–1.0)
Monocytes Relative: 11 % (ref 3–12)
Neutro Abs: 3.4 10*3/uL (ref 1.7–7.7)
Neutrophils Relative %: 60 % (ref 43–77)
PLATELETS: 272 10*3/uL (ref 150–400)
RBC: 5.16 MIL/uL (ref 4.22–5.81)
RDW: 14.6 % (ref 11.5–15.5)
WBC: 5.7 10*3/uL (ref 4.0–10.5)

## 2015-11-17 LAB — TSH: TSH: 2.112 u[IU]/mL (ref 0.350–4.500)

## 2015-11-17 LAB — LIPID PANEL
CHOL/HDL RATIO: 5.8 ratio — AB (ref <=5.0)
Cholesterol: 163 mg/dL (ref 125–200)
HDL: 28 mg/dL — ABNORMAL LOW (ref >=40)
LDL CALC: 99 mg/dL (ref <130)
TRIGLYCERIDES: 182 mg/dL — AB (ref <150)
VLDL: 36 mg/dL — AB (ref <30)

## 2015-11-17 NOTE — Telephone Encounter (Signed)
Refill x 6 months 

## 2015-11-18 LAB — PSA: PSA: 0.22 ng/mL (ref <=4.00)

## 2015-11-19 ENCOUNTER — Encounter: Payer: Self-pay | Admitting: Internal Medicine

## 2015-11-19 ENCOUNTER — Ambulatory Visit (INDEPENDENT_AMBULATORY_CARE_PROVIDER_SITE_OTHER): Payer: Commercial Managed Care - PPO | Admitting: Internal Medicine

## 2015-11-19 VITALS — BP 148/92 | HR 74 | Temp 97.0°F | Resp 20 | Ht 71.0 in

## 2015-11-19 DIAGNOSIS — E039 Hypothyroidism, unspecified: Secondary | ICD-10-CM | POA: Diagnosis not present

## 2015-11-19 DIAGNOSIS — R0683 Snoring: Secondary | ICD-10-CM

## 2015-11-19 DIAGNOSIS — K219 Gastro-esophageal reflux disease without esophagitis: Secondary | ICD-10-CM

## 2015-11-19 DIAGNOSIS — G4719 Other hypersomnia: Secondary | ICD-10-CM

## 2015-11-19 DIAGNOSIS — G4730 Sleep apnea, unspecified: Secondary | ICD-10-CM

## 2015-11-19 DIAGNOSIS — Z8709 Personal history of other diseases of the respiratory system: Secondary | ICD-10-CM

## 2015-11-19 DIAGNOSIS — F411 Generalized anxiety disorder: Secondary | ICD-10-CM | POA: Diagnosis not present

## 2015-11-19 DIAGNOSIS — Z0000 Encounter for general adult medical examination without abnormal findings: Secondary | ICD-10-CM

## 2015-11-19 DIAGNOSIS — I10 Essential (primary) hypertension: Secondary | ICD-10-CM

## 2015-11-19 LAB — POCT URINALYSIS DIPSTICK
BILIRUBIN UA: NEGATIVE
GLUCOSE UA: NEGATIVE
Ketones, UA: NEGATIVE
Leukocytes, UA: NEGATIVE
NITRITE UA: NEGATIVE
PH UA: 5
Protein, UA: NEGATIVE
RBC UA: NEGATIVE
UROBILINOGEN UA: 0.2

## 2015-11-19 MED ORDER — LEVOTHYROXINE SODIUM 88 MCG PO TABS: 88.0000 ug | ORAL_TABLET | Freq: Every day | ORAL | Status: DC

## 2015-11-19 NOTE — Progress Notes (Signed)
Subjective:    Patient ID: Ricky Sharp, male    DOB: 1961-02-13, 54 y.o.   MRN: [id number]  HPI 54 year old White Male for health maintenance exam and evaluation of medical issues. He is a history of essential hypertension, hypothyroidism, anxiety, situational stress, hypokalemia, excessive daytime sleepiness, sleep apnea and snoring.  Cardiologist is Dr. Acie Sharp. He's been tried on losartan but did not tolerate that. He is on diuretic for blood pressure control. Also on amlodipine. History of asthma and GE reflux. Hypothyroidism diagnosed January 2015.  Has seen neurologist regarding excessive daytime sleepiness. C- Pap has been prescribed. Still feels sleepy. May need to see neurologist once again. May be candidate for Provigil.  History of asthma and GE reflux. Is on Dexilant for reflux. He was admitted to hospital in 2003 with chest pain and epigastric pain. He had an upper endoscopy and was diagnosed with peptic ulcer disease secondary to H. pylori infection. MI was ruled out. Had negative Cardiolite study.  In 2008 he had a lipoma removed from right upper back.  Past medical history: Fractured left clavicle 1981. History of hepatitis 1966. In 1998, he had a positive hepatitis B antibody. History of allergic rhinitis. Wisdom teeth extracted 1982. Osteochondroma removed from right tibia 1983. Kidney stone 2003.  Family history: Mother with history of asthma died earlier this year with heart failure. No siblings. Does not know his biological father.  He is on Lexapro 10 mg daily.    Review of Systems excessive daytime sleepiness. Denies being depressed. Children doing well. He and wife are no longer in counseling. Stress with mother's death and settling estate but that has been completed.     Objective:   Physical Exam  Constitutional: He is oriented to person, place, and time. He appears well-developed and well-nourished. No distress.  HENT:  Head: Normocephalic and atraumatic.   Right Ear: External ear normal.  Left Ear: External ear normal.  Mouth/Throat: Oropharynx is clear and moist. No oropharyngeal exudate.  Eyes: Conjunctivae and EOM are normal. Pupils are equal, round, and reactive to light. Right eye exhibits no discharge. Left eye exhibits no discharge. No scleral icterus.  Neck: No JVD present. No thyromegaly present.  Cardiovascular: Normal rate, regular rhythm, normal heart sounds and intact distal pulses.   No murmur heard. Pulmonary/Chest: Effort normal and breath sounds normal. No respiratory distress. He has no wheezes. He has no rales.  Abdominal: Soft. Bowel sounds are normal. He exhibits no distension and no mass. There is no tenderness. There is no rebound and no guarding.  Genitourinary: Prostate normal.  Musculoskeletal: He exhibits no edema.  Lymphadenopathy:    He has no cervical adenopathy.  Neurological: He is alert and oriented to person, place, and time. He has normal reflexes. No cranial nerve deficit. Coordination normal.  Skin: Skin is warm and dry. No rash noted. He is not diaphoretic.  Psychiatric: He has a normal mood and affect. His behavior is normal. Judgment and thought content normal.  Vitals reviewed.         Assessment & Plan:  Excessive daytime sleepiness  Essential hypertension  Anxiety  GE reflux  History of asthma  Hypothyroidism  Sleep apnea  Plan: May need to see neurologist once again regarding excessive daytime sleepiness. Do not think he is significantly depressed although he had stressful year with his mother's death. Both daughters away at college and seemed to be doing well. Doesn't seem to be particularly challenged by his job. Continue same  medications and return in 6 months.  Addendum: 12/12/2015: Patient is seen Dr. Brett Sharp recently and she started him on Provigil.

## 2015-11-24 ENCOUNTER — Ambulatory Visit: Payer: Commercial Managed Care - PPO | Admitting: Neurology

## 2015-11-26 ENCOUNTER — Encounter: Payer: Self-pay | Admitting: Neurology

## 2015-11-26 ENCOUNTER — Ambulatory Visit (INDEPENDENT_AMBULATORY_CARE_PROVIDER_SITE_OTHER): Payer: Commercial Managed Care - PPO | Admitting: Neurology

## 2015-11-26 VITALS — BP 150/84 | HR 84 | Resp 20 | Ht 70.5 in | Wt 208.0 lb

## 2015-11-26 DIAGNOSIS — G4733 Obstructive sleep apnea (adult) (pediatric): Secondary | ICD-10-CM | POA: Diagnosis not present

## 2015-11-26 DIAGNOSIS — Z9989 Dependence on other enabling machines and devices: Principal | ICD-10-CM

## 2015-11-26 MED ORDER — MODAFINIL 200 MG PO TABS: 200.0000 mg | ORAL_TABLET | Freq: Every day | ORAL | Status: DC

## 2015-11-26 NOTE — Patient Instructions (Signed)
Modafinil tablets What is this medicine? MODAFINIL (moe DAF i nil) is used to treat excessive sleepiness caused by certain sleep disorders. This includes narcolepsy, sleep apnea, and shift work sleep disorder. This medicine may be used for other purposes; ask your health care provider or pharmacist if you have questions. What should I tell my health care provider before I take this medicine? They need to know if you have any of these conditions: -history of depression, mania, or other mental disorder -kidney disease -liver disease -an unusual or allergic reaction to modafinil, other medicines, foods, dyes, or preservatives -pregnant or trying to get pregnant -breast-feeding How should I use this medicine? Take this medicine by mouth with a glass of water. Follow the directions on the prescription label. Take your doses at regular intervals. Do not take your medicine more often than directed. Do not stop taking except on your doctor's advice. A special MedGuide will be given to you by the pharmacist with each prescription and refill. Be sure to read this information carefully each time. Talk to your pediatrician regarding the use of this medicine in children. This medicine is not approved for use in children. Overdosage: If you think you have taken too much of this medicine contact a poison control center or emergency room at once. NOTE: This medicine is only for you. Do not share this medicine with others. What if I miss a dose? If you miss a dose, take it as soon as you can. If it is almost time for your next dose, take only that dose. Do not take double or extra doses. What may interact with this medicine? Do not take this medicine with any of the following medications: -amphetamine or dextroamphetamine -dexmethylphenidate or methylphenidate -medicines called MAO Inhibitors like Nardil, Parnate, Marplan, Eldepryl -pemoline -procarbazine This medicine may also interact with the following  medications: -antifungal medicines like itraconazole or ketoconazole -barbiturates like phenobarbital -birth control pills or other hormone-containing birth control devices or implants -carbamazepine -cyclosporine -diazepam -medicines for depression, anxiety, or psychotic disturbances -phenytoin -propranolol -triazolam -warfarin This list may not describe all possible interactions. Give your health care provider a list of all the medicines, herbs, non-prescription drugs, or dietary supplements you use. Also tell them if you smoke, drink alcohol, or use illegal drugs. Some items may interact with your medicine. What should I watch for while using this medicine? Visit your doctor or health care professional for regular checks on your progress. The full effects of this medicine may not be seen right away. This medicine may affect your concentration, function, or may hide signs that you are tired. You may get dizzy. Do not drive, use machinery, or do anything that needs mental alertness until you know how this drug affects you. Alcohol can make you more dizzy and may interfere with your response to this medicine or your alertness. Avoid alcoholic drinks. Birth control pills may not work properly while you are taking this medicine. Talk to your doctor about using an extra method of birth control. It is unknown if the effects of this medicine will be increased by the use of caffeine. Caffeine is available in many foods, beverages, and medications. Ask your doctor if you should limit or change your intake of caffeine-containing products while on this medicine. What side effects may I notice from receiving this medicine? Side effects that you should report to your doctor or health care professional as soon as possible: -allergic reactions like skin rash, itching or hives, swelling of the face,   lips, or tongue -anxiety -breathing problems -chest pain -fast, irregular  heartbeat -hallucinations -increased blood pressure -redness, blistering, peeling or loosening of the skin, including inside the mouth -sore throat, fever, or chills -suicidal thoughts or other mood changes -tremors -vomiting Side effects that usually do not require medical attention (report to your doctor or health care professional if they continue or are bothersome): -headache -nausea, diarrhea, or stomach upset -nervousness -trouble sleeping This list may not describe all possible side effects. Call your doctor for medical advice about side effects. You may report side effects to FDA at 1-800-FDA-1088. Where should I keep my medicine? Keep out of the reach of children. This medicine can be abused. Keep your medicine in a safe place to protect it from theft. Do not share this medicine with anyone. Selling or giving away this medicine is dangerous and against the law. This medicine may cause accidental overdose and death if taken by other adults, children, or pets. Mix any unused medicine with a substance like cat litter or coffee grounds. Then throw the medicine away in a sealed container like a sealed bag or a coffee can with a lid. Do not use the medicine after the expiration date. Store at room temperature between 20 and 25 degrees C (68 and 77 degrees F). NOTE: This sheet is a summary. It may not cover all possible information. If you have questions about this medicine, talk to your doctor, pharmacist, or health care provider.    2016, Elsevier/Gold Standard. (2014-08-19 15:34:55)  

## 2015-11-26 NOTE — Progress Notes (Signed)
[facility]INE [facility]   Provider:  Larey Seat, M D  Referring Provider: Elby Showers, MD Primary Care Physician:  Elby Showers, MD  Chief Complaint  Patient presents with  . Follow-up    on cpap, doesn't think cpap is benefiting him much, rm 11, alone   Chief complaint according to patient : "I am fatigued and sleepy "   HPI:  Ricky Sharp is a 54 y.o. male , seen here as a referral from Dr. Renold Genta for a sleep evaluation,  Ricky Sharp reports that he has been a light sleeper most of his life. 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 Both parents in law have died in 03-28-13 and then in 03/28/2014 and just this may his mother died. He has 2 kids of college age. He feels that he has been chronically fatigued unrelenting the fatigue to a disabling degree. This is partially reflected in today's questionnaires. He endorsed the fatigue severity score at 53 points and the Epworth sleepiness score at 9 points. Part of what made him seek a sleep specialist opinion was that his wife was evaluated and diagnosed with obstructive sleep apnea and is now on CPAP. In the process of  an internal medicine workup for fatigue Dr. Renold Genta also ordered multiple metabolic panels. The patient learned that he had a thyroid dysfunction.hypo-thyroidism.  He regularly takes a multivitamin but he was not asked to shore up on vitamin D or B12 supplements. He was also not told if he is anemic or not. I reviewed the lab results from December, February and again from last August and his TSH  level has significantly come down. No anemia .He has frequent nasal allergies.   1) the severe fatigue that the patient reports could still be an aftermath of the thyroid disease. However I am strongly suspicious that the patient will have obstructive sleep apnea or at least upper  airway resistance he. He has a very narrow upper airway with a Mallampati grade 4, he does have a crowded lower jaw that is slightly hypoplastic, creating retrognathia. His spouse has already told him that he snores but she was not explicit about having witnessed apneas. the patient has already used a pulse oximetry which noted no significant desaturations. This was not a medically ordered procedure but a home test device.  2) I like for the patient also to take extra vitamin B12 or vitamin B complex and vitamin D. The supplements can help to overcome fatigue. I will not order specific blood tests for deficiencies as these will most likely be have to be covered by the patient himself.  3) I will order a attended polysomnography as a split-night study. I explained that he will sleep for about 2 hours to make the diagnosis the rest of the night can be used to treat the apnea. If his insurance carrier will not permit and attended sleep study and insists on a home sleep test he may have to return later for a titration. He does not report morning headaches and he has no underlying cardiac or lung disease so  capnography will not be necessary.  Sleep habits are as follows: The patient usually goes to bed between 10 and 11 and rises at 5 in the morning. He has always been a light sleeper as mentioned before and usually is awake before  the alarm rings. A good night would be 6 hours of sleep most nights he will accumulate about 4-1/2-5 hours of sleep. This has been going on for over a decade. He sleeps on his side, bedroom is cool and quiet and dark. His wife likes to watch TV in the bedroom.  Typically he would not have considered himself and nap time. However over the increasing fatigue over the last 12-24 month especially, he suffers from the irresistible urge to go to sleep. Sleep medical history and family sleep history:  Not known.  Social history: married, working regular office hours, non smoker, socially   ETOH drinker , caffeine 2 espresso daily.   Interval history from 10-07-15. Ricky Sharp is seen here today after his recent sleep study dated 09-24-15. His polysomnography revealed mild apnea 15 obstructive apneas were seen and 43 shallow breathing spells the AHI was 10.6 the RDI 10.6 the REM AHI was 30.6. Nadir was 87% oxygenation but for only 6 meet minutes. Based on the outcome of the sleep study I feel that CPAP could be helpful in treating all observed sleep disorders mild apnea, snoring, upper airway resistance he syndrome. But the patient also has retrognathia and may actually improved to a significant degree by using a dental device such as a mandibular advancement device. He has advised me of a TMJ click at the right jaw but he is not in pain. It may be possible to treat bruxism and snoring with this device and not aggravate the TM joint.  Based on his degree of apnea can go either way CPAP or dental device. A dental device would be manufactured to the specification of the sleep lab in the treatment of a medical condition and therefore would run through his general health insurance and not through a dental insurance. Will order auto CPAP. 5-12 cm water. Mask of patients choice.   History from 11-26-15. Ricky Sharp has numeric done very well with the CPAP. He is an 83% compliance for over 4 hours of daily use the average user time is 6 hours 42 minutes. He is using an AutoSet which allows pressure changes between 5 and 12 cm. EPR is set at 3 cm. The 91st percentile of pressure used at night came out at 7.5 cm water.  His AHI is 1.3 which is an excellent resolution of apnea which was at baseline only 10.6 but during REM sleep 30.6.  He has no significant air leaks and the pressure seems to be stable. He endorses today the Epworth sleepiness score at 9 but the fatigue severity score  remains very high at 55 points. He feels at best a marginal improvement but he would engage at about 10%. It is not  insomnia at night so the apnea is numerically treated but he remains excessively fatigued not so much sleepy. The question is if we can place him on a medication for fatigue. Aside from his sleep apnea there is no other sleep disorder that needs to be addressed. He has had a recent appointment with his primary care physician Dr. Dawna Part, and he and his primary care physician R looking further for reasons of significant fatigue. As it stands now the apnea is not the cause for the continued fatigue but it would be FDA approved to write modafinil for him to treat fatigue.   Review of Systems: Out of a complete 14 system review, the patient complains of only the following symptoms, and all other reviewed systems are negative.  Mild OSA,  Epworth score 9 , Fatigue severity score 55   , depression score n/a, anxiety - has side effects to all SSRI until low dose lexapro was used.    Social History   Social History  . Marital Status: Married    Spouse Name: N/A  . Number of Children: N/A  . Years of Education: N/A   Occupational History  . network admin    Social History Main Topics  . Smoking status: Never Smoker   . Smokeless tobacco: Not on file  . Alcohol Use: No  . Drug Use: No  . Sexual Activity: Not on file   Other Topics Concern  . Not on file   Social History Narrative    Family History  Problem Relation Age of Onset  . Adopted: Yes  . Fibromyalgia Mother   . Syncope episode Mother   . Lung disease Mother     Past Medical History  Diagnosis Date  . Asthma   . Allergic rhinitis   . GERD (gastroesophageal reflux disease)   . Hypertension     Past Surgical History  Procedure Laterality Date  . Cholecystectomy    . Tonsillectomy    . Osteochondroma excision      Current Outpatient Prescriptions  Medication Sig Dispense Refill  . ALPRAZolam (XANAX) 0.25 MG tablet Take 1 tablet (0.25 mg total) by mouth 2 (two) times daily as needed for anxiety. 60  tablet 0  . amLODipine (NORVASC) 5 MG tablet Take 1 tablet (5 mg total) by mouth daily. 31 tablet 11  . CARAFATE 1 GM/10ML suspension Take by mouth as directed.     . cromolyn (NASALCROM) 5.2 MG/ACT nasal spray Place 1 spray into the nose 4 (four) times daily.      Marland Kitchen dexlansoprazole (DEXILANT) 60 MG capsule Take 60 mg by mouth daily as needed.     Marland Kitchen DYMISTA 137-50 MCG/ACT SUSP USE 1-2 SPRAYS IN EACH NOSTRIL AT BEDTIME AS DIRECTED 23 g PRN  . EPINEPHrine (EPIPEN JR) 0.15 MG/0.3 ML injection Inject into thigh if needed for severe allergic reavtion 1 each prn  . escitalopram (LEXAPRO) 10 MG tablet TAKE (1/2) TABLET DAILY. 45 tablet 1  . hydrochlorothiazide (HYDRODIURIL) 25 MG tablet Take 1 tablet (25 mg total) by mouth daily. 30 tablet 11  . ibuprofen (ADVIL,MOTRIN) 200 MG tablet Take 200 mg by mouth every 6 (six) hours as needed.      Marland Kitchen levothyroxine (SYNTHROID, LEVOTHROID) 88 MCG tablet Take 1 tablet (88 mcg total) by mouth daily. 90 tablet 0  . Multiple Vitamins-Minerals (CENTRUM SILVER) tablet Take 1 tablet by mouth daily.    . NONFORMULARY OR COMPOUNDED ITEM Allergy Vaccine 1:10 Given at Home    . olopatadine (PATANOL) 0.1 % ophthalmic solution Place 1 drop into both eyes 2 (two) times daily. As needed 5 mL prn  . Omega-3 Fatty Acids (FISH OIL) 1000 MG CAPS Take 1 capsule by mouth every evening.    . ORACEA 40 MG capsule     . potassium chloride (K-DUR) 10 MEQ tablet Take 10 mEq by mouth daily. Take 2 tablet in the AM and 2 tablets in the PM.    . triamcinolone cream (KENALOG) 0.1 % Apply 1 application topically 2 (two) times daily. 45 g 2   No current facility-administered medications for this visit.    Allergies as of 11/26/2015 - Review Complete 11/26/2015  Allergen Reaction Noted  . Lisinopril  01/01/2015  . Losartan  09/11/2014  Vitals: BP 150/84 mmHg  Pulse 84  Resp 20  Ht 5' 10.5" (1.791 m)  Wt 208 lb (94.348 kg)  BMI 29.41 kg/m2 Last Weight:  Wt Readings from Last 1  Encounters:  11/26/15 208 lb (94.348 kg)   [id number] mass index is 29.41 kg/(m^2).     Last Height:   Ht Readings from Last 1 Encounters:  11/26/15 5' 10.5" (1.791 m)    Physical exam:  General: The patient is awake, alert and appears not in acute distress. The patient is well groomed. Head: Normocephalic, atraumatic. Neck is supple. Mallampati 4, uvula is not visible even with use of tongue depression. neck circumference: 17 . Nasal airflow unrestricted , TMJ is not evident . Retrognathia is seen.  Cardiovascular:  Regular rate and rhythm , without  murmurs or carotid bruit, and without distended neck veins. Respiratory: Lungs are clear to auscultation. Skin:  Without evidence of edema, or rash Trunk: BMI is elevated .  The patient's posture is erect    Neurologic exam : The patient is awake and alert, oriented to place and time.   Memory subjective described as intact.  Attention span & concentration ability appears normal.  Speech is fluent,  without dysarthria, dysphonia or aphasia.  Mood and affect are appropriate.  Cranial nerves: Reports loss of smell, but not of taste. Pupils are equal and briskly reactive to light.  Visual fields by finger perimetry are intact. Hearing to finger rub intact. Facial sensation intact to fine touch.  Facial motor strength is symmetric and tongue and uvula move midline. Shoulder shrug was symmetrical.  Motor exam:   Normal tone, muscle bulk and symmetric strength in all extremities. Deep tendon reflexes: in the  upper and lower extremities are symmetric and intact. Babinski maneuver response is downgoing.  The patient was advised of the nature of the diagnosed sleep disorder , the treatment options and risks for general a health and wellness arising from not treating the condition.  I spent more than 15 minutes of face to face time with the patient.  Greater than 50% of time was spent in counseling and coordination of care. We have discussed the  diagnosis and differential and I answered the patient's questions.    I will order Modafinil after review of clinical symptoms and CPAP download form AUT[employer].   Assessment:  After physical and neurologic examination, review of laboratory studies,  Personal review of imaging studies, reports of other /same  Imaging studies ,  Results of polysomnography/ neurophysiology testing and pre-existing records as far as provided in visit., my assessment is   MIld apnea , snoring and UARS, retrognathia.   1) continue  auto CPAP 5-12 cm water, mask of patient s choice. If he feels uncomfortable and/ or is intolerant of CPAP, will pursue  the dental device. Mandibular advancement therapy .  2) Modafinil.     Asencion Partridge Abbygail Willhoite MD  11/26/2015   CC: Elby Showers, Md [address] [place], [place] [id number]

## 2015-12-12 ENCOUNTER — Encounter: Payer: Self-pay | Admitting: Internal Medicine

## 2015-12-12 DIAGNOSIS — G4719 Other hypersomnia: Secondary | ICD-10-CM | POA: Insufficient documentation

## 2015-12-12 NOTE — Patient Instructions (Signed)
Continue diet and exercise. Continue same medications. Follow-up with neurologist. Return here in 6 months for follow-up on hypothyroidism and blood pressure.

## 2016-01-05 ENCOUNTER — Other Ambulatory Visit: Payer: Commercial Managed Care - PPO | Admitting: Internal Medicine

## 2016-01-05 DIAGNOSIS — E039 Hypothyroidism, unspecified: Secondary | ICD-10-CM

## 2016-01-05 LAB — TSH: TSH: 1.755 u[IU]/mL (ref 0.350–4.500)

## 2016-01-08 ENCOUNTER — Ambulatory Visit (INDEPENDENT_AMBULATORY_CARE_PROVIDER_SITE_OTHER): Payer: Commercial Managed Care - PPO | Admitting: Internal Medicine

## 2016-01-08 ENCOUNTER — Encounter: Payer: Self-pay | Admitting: Internal Medicine

## 2016-01-08 VITALS — BP 112/70 | HR 67 | Temp 97.6°F | Resp 20 | Ht 70.5 in | Wt 209.0 lb

## 2016-01-08 DIAGNOSIS — I10 Essential (primary) hypertension: Secondary | ICD-10-CM

## 2016-01-08 DIAGNOSIS — G4733 Obstructive sleep apnea (adult) (pediatric): Secondary | ICD-10-CM | POA: Diagnosis not present

## 2016-01-08 DIAGNOSIS — Z658 Other specified problems related to psychosocial circumstances: Secondary | ICD-10-CM

## 2016-01-08 DIAGNOSIS — E039 Hypothyroidism, unspecified: Secondary | ICD-10-CM

## 2016-01-08 DIAGNOSIS — J309 Allergic rhinitis, unspecified: Secondary | ICD-10-CM | POA: Diagnosis not present

## 2016-01-08 DIAGNOSIS — G4719 Other hypersomnia: Secondary | ICD-10-CM

## 2016-01-08 DIAGNOSIS — F439 Reaction to severe stress, unspecified: Secondary | ICD-10-CM

## 2016-01-08 NOTE — Progress Notes (Signed)
   Subjective:    Patient ID: Ricky Sharp, male    DOB: 1961-04-23, 55 y.o.   MRN: [id number]  HPI He's here today to follow-up on hypothyroidism. At last visit, TSH was increased from 0.075 mg to 0.088 mg daily because of TSH in the 2 range rather than the 1 range and persistent fatigue and daytime sleepiness. Dr. [name] prescribed Provigil but he has yet to take it. He and his wife are returning to counseling with Dennison Nancy. 2 day history of diarrhea symptoms. Has had abdominal pain and bloating. Multiple loose stools daily. He's been on clear liquids. Likely has viral gastroenteritis. No fever or chills.    Review of Systems     Objective:   Physical Exam Not examined. TSH reviewed. Spent 25 minutes speaking with patient about marital issues, physical health, stress, daytime sleepiness       Assessment & Plan:  Excessive daytime sleepiness-encouraged him to try Provigil  Hypothyroidism-TSH excellent. Continue 0.088 mg daily of levothyroxine and return in 6 months for follow-up. Had physical exam December.  Situational stress-back in marital counseling with Dennison Nancy  Essential hypertension-blood pressure today is normal  Viral gastroenteritis-clear liquids and advance diet slowly  Sleep apnea-patient doesn't like using C Pap apparatus and doesn't feel that it is a big problem

## 2016-01-08 NOTE — Patient Instructions (Signed)
Continue 0.088 mg levothyroxin daily. Return in 6 months. Encouraged patient to try Provigil for excessive daytime sleepiness.

## 2016-02-02 ENCOUNTER — Other Ambulatory Visit: Payer: Self-pay | Admitting: Internal Medicine

## 2016-02-08 ENCOUNTER — Other Ambulatory Visit: Payer: Self-pay | Admitting: Cardiovascular Disease

## 2016-02-13 ENCOUNTER — Other Ambulatory Visit: Payer: Self-pay | Admitting: Cardiovascular Disease

## 2016-03-14 ENCOUNTER — Encounter: Payer: Self-pay | Admitting: Internal Medicine

## 2016-03-14 ENCOUNTER — Ambulatory Visit (INDEPENDENT_AMBULATORY_CARE_PROVIDER_SITE_OTHER): Payer: Commercial Managed Care - PPO | Admitting: Internal Medicine

## 2016-03-14 VITALS — BP 136/84 | HR 81 | Temp 99.1°F | Resp 20 | Wt 215.0 lb

## 2016-03-14 DIAGNOSIS — J069 Acute upper respiratory infection, unspecified: Secondary | ICD-10-CM

## 2016-03-14 DIAGNOSIS — R0789 Other chest pain: Secondary | ICD-10-CM

## 2016-03-14 MED ORDER — METHYLPREDNISOLONE ACETATE 80 MG/ML IJ SUSP
80.0000 mg | Freq: Once | INTRAMUSCULAR | Status: AC
  Administered 2016-03-14: 80 mg via INTRAMUSCULAR

## 2016-03-14 MED ORDER — LEVOFLOXACIN 500 MG PO TABS: 500.0000 mg | ORAL_TABLET | Freq: Every day | ORAL | Status: DC

## 2016-03-14 MED ORDER — HYDROCODONE-HOMATROPINE 5-1.5 MG/5ML PO SYRP: 5.0000 mL | ORAL_SOLUTION | Freq: Three times a day (TID) | ORAL | Status: DC | PRN

## 2016-03-14 NOTE — Progress Notes (Signed)
   Subjective:    Patient ID: Ricky Sharp, male    DOB: 1960-12-27, 55 y.o.   MRN: [id number]  HPI Onset URI symptoms March 31st. Has had fullness in head, low grade fever and now nonproductive cough. Slight chills.  Slight achiness. No wheezing. Has fatigue and malaise. Has been working the past 2 weekends. Last Crestor infection was October 2016. He's been coughing a lot and actually thinks he strained something in his left posterior chest.    Review of Systems     Objective:   Physical Exam Skin warm and dry. TMs are scared by cerumen. Pharynx clear. Neck is supple without adenopathy. Chest clear to auscultation without rales or wheezing. He has point tenderness along left lower rib posteriorly.       Assessment & Plan:  Acute URI   Depomedrol 80mg  IM,   Hycodan one tsp po q 8 hours prn cough, Levaqiuin 500 mg x 10 days. Rest and drink plenty of fluids.

## 2016-03-14 NOTE — Patient Instructions (Signed)
Levaquin 500 mg daily x 10 days. Depomedrol 80 mg IM. Hycodan as needed for cough.

## 2016-04-04 ENCOUNTER — Encounter: Payer: Self-pay | Admitting: Cardiovascular Disease

## 2016-04-04 ENCOUNTER — Ambulatory Visit (INDEPENDENT_AMBULATORY_CARE_PROVIDER_SITE_OTHER): Payer: Commercial Managed Care - PPO | Admitting: Cardiovascular Disease

## 2016-04-04 VITALS — BP 140/84 | HR 72 | Ht 70.5 in | Wt 211.0 lb

## 2016-04-04 DIAGNOSIS — I10 Essential (primary) hypertension: Secondary | ICD-10-CM | POA: Diagnosis not present

## 2016-04-04 MED ORDER — DILTIAZEM HCL ER 180 MG PO CP24: 180.0000 mg | ORAL_CAPSULE | Freq: Every day | ORAL | Status: DC

## 2016-04-04 NOTE — Patient Instructions (Signed)
Medication Instructions:  STOP Amlodipine (Norvasc)  START Cardizem (Diltiazem) CD 180 mg once daily  Labwork: None Ordered   Testing/Procedures: None Ordered   Follow-Up: Your physician wants you to follow-up in: 1 year with Dr. Acie Fredrickson.  You will receive a reminder letter in the mail two months in advance. If you don't receive a letter, please call our office to schedule the follow-up appointment.   If you need a refill on your cardiac medications before your next appointment, please call your pharmacy.   Thank you for choosing [facility]! Christen Bame, RN [phone]

## 2016-04-04 NOTE — Progress Notes (Signed)
Cardiology Office Note   Date:  04/04/2016   ID:  Ricky Sharp, DOB 03/04/1961, MRN [id number]  PCP:  Elby Showers, MD  Cardiologist:   Thayer Headings, MD   Chief Complaint  Patient presents with  . Hypertension      History of Present Illness: Ricky Sharp is a 55 y.o. male who presents for HTN  1. Hypertension 2. Anxiety - stress related 3. GERD  History of Present Illness:  Patient is a 55 year old gentleman with a history of hypertension. He's tried several medications in the past that have not completely controlled his blood pressure.  - He has tried Losartan but had some dizziness / dry mouth.  - tried bystolic 5 mg - felt poorly, persistant head ache, diarrhea, mild respiratory distress. Tried for a week  He has also tried some antianxiety meds but had various reactions.  He has multiple family and life stresses.   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 He is also having some family health issues that require him to spend lots of time with them.  He has felt well on his HCTZ and potassium since his last visit. He has not been exercising as much as he would like.  Oct. 1, 2014:  Ricky Sharp is a 55 yo with hx of HTN. He has not been exercising. He has gained 10 lbs. He has tried Losartan but it caused vertigo. He tried beta blockers but had severe fatigue and slow HR. He does not want to start any new meds. He knows that his weight is up and that is BP has been elevated.   March 04, 2014:  Ricky Sharp has been diagnosed with hypothyroidism since I last saw him. He is feeling somewhat better.  Diastolic BP has still been a bit high. Still eating some salt. BP at home is typically is the 80s - occasionally upper 80s.   Oct. 1, 2015:  Ricky Sharp is now off the Losartan - was having lots of fatigue and diarrhea. Symptoms are better. On HCTZ and  blood pressures are well controlled. Rides his bike on occasion. He also is taking Judo . Not Having any episodes of chest pain BP has been ok  Jan. 21, 2016:  Ricky Sharp has had HTN that has been difficult to manage.  He developed GI complaints with Losartan and with Lisinopril.  He had significant diarrhea on the Lisinopril - has resolved.   April 03, 2015:  Ricky Sharp has been seen in the hypertension clinic for the past several months. His blood pressure is now well controlled. No CP , no dyspnea. Still has anxiety.   April 04, 2016: Ricky Sharp is doing well  Weight is up a little  Mother died , lots of stress.  Amlodipine still causes diarrhea - ( like clockwork)   Wt Readings from Last 3 Encounters:  04/04/16 211 lb (95.709 kg)  03/14/16 215 lb (97.523 kg)  01/08/16 209 lb (94.802 kg)     No past medical history on file.  Past Surgical History  Procedure Laterality Date  . Cholecystectomy    . Tonsillectomy    . Osteochondroma excision       Current Outpatient Prescriptions  Medication Sig Dispense Refill  . ALPRAZolam (XANAX) 0.25 MG tablet Take 1 tablet (0.25 mg total) by mouth 2 (two) times daily as needed for anxiety. 60 tablet 0  . amLODipine (NORVASC) 5 MG tablet Take 1 tablet (5  mg total) by mouth daily. 31 tablet 2  . CARAFATE 1 GM/10ML suspension Take by mouth as directed.     . cromolyn (NASALCROM) 5.2 MG/ACT nasal spray Place 1 spray into the nose 4 (four) times daily.      Marland Kitchen dexlansoprazole (DEXILANT) 60 MG capsule Take 60 mg by mouth daily as needed.     Marland Kitchen DYMISTA 137-50 MCG/ACT SUSP USE 1-2 SPRAYS IN EACH NOSTRIL AT BEDTIME AS DIRECTED 23 g PRN  . EPINEPHrine (EPIPEN JR) 0.15 MG/0.3 ML injection Inject into thigh if needed for severe allergic reavtion 1 each prn  . escitalopram (LEXAPRO) 10 MG tablet TAKE (1/2) TABLET DAILY. 45 tablet 1  . hydrochlorothiazide (HYDRODIURIL) 25 MG tablet Take 1 tablet (25 mg total) by mouth daily. 30 tablet 11  . ibuprofen  (ADVIL,MOTRIN) 200 MG tablet Take 200 mg by mouth every 6 (six) hours as needed.      Marland Kitchen levothyroxine (SYNTHROID, LEVOTHROID) 88 MCG tablet TAKE 1 TABLET ONCE DAILY. 34 tablet 5  . modafinil (PROVIGIL) 200 MG tablet Take 1 tablet (200 mg total) by mouth daily. 30 tablet 0  . Multiple Vitamins-Minerals (CENTRUM SILVER) tablet Take 1 tablet by mouth daily.    . NONFORMULARY OR COMPOUNDED ITEM Allergy Vaccine 1:10 Given at Home    . olopatadine (PATANOL) 0.1 % ophthalmic solution Place 1 drop into both eyes 2 (two) times daily. As needed 5 mL prn  . Omega-3 Fatty Acids (FISH OIL) 1000 MG CAPS Take 1 capsule by mouth every evening.    . ORACEA 40 MG capsule     . potassium chloride (K-DUR) 10 MEQ tablet TAKE (2) TABLETS TWICE DAILY. 120 tablet 2  . triamcinolone cream (KENALOG) 0.1 % Apply 1 application topically 2 (two) times daily. 45 g 2   No current facility-administered medications for this visit.    Allergies:   Lisinopril and Losartan    Social History:  The patient  reports that he has never smoked. He does not have any smokeless tobacco history on file. He reports that he does not drink alcohol or use illicit drugs.   Family History:  The patient's family history includes Fibromyalgia in his mother; Lung disease in his mother; Syncope episode in his mother. He was adopted.    ROS:  Please see the history of present illness.   Otherwise, review of systems are positive for none.   All other systems are reviewed and negative.    PHYSICAL EXAM: VS:  BP 140/84 mmHg  Pulse 72  Ht 5' 10.5" (1.791 m)  Wt 211 lb (95.709 kg)  BMI 29.84 kg/m2 , BMI Body mass index is 29.84 kg/(m^2). GEN: Well nourished, well developed, in no acute distress HEENT: normal Neck: no JVD, carotid bruits, or masses Cardiac: RRR; no murmurs, rubs, or gallops,no edema  Respiratory:  clear to auscultation bilaterally, normal work of breathing GI: soft, nontender, nondistended, + BS MS: no deformity or  atrophy Skin: warm and dry, no rash Neuro:  Strength and sensation are intact Psych: euthymic mood, full affect   EKG:  EKG is ordered today. NSR at 72.   Normal ECG     Recent Labs: 11/17/2015: ALT 46; BUN 18; Creat 1.00; Hemoglobin 15.8; Platelets 272; Potassium 4.0; Sodium 140 01/05/2016: TSH 1.755    Lipid Panel    Component Value Date/Time   CHOL 163 11/17/2015 0900   TRIG 182* 11/17/2015 0900   HDL 28* 11/17/2015 0900   CHOLHDL 5.8* 11/17/2015 0900  VLDL 36* 11/17/2015 0900   LDLCALC 99 11/17/2015 0900      Wt Readings from Last 3 Encounters:  04/04/16 211 lb (95.709 kg)  03/14/16 215 lb (97.523 kg)  01/08/16 209 lb (94.802 kg)      Other studies Reviewed: Additional studies/ records that were reviewed today include: . Review of the above records demonstrates:    ASSESSMENT AND PLAN:  1.  Essential  Hypertension:  Malekhi presents again with hypertension. He has tried by systolic, losartan, lisinopril but did not tolerate any of these medications. Amlodipine seems to be working fairly well but it's causing diarrhea. He seems to be managing this fairly well but as a trial will see if diltiazem 180 mg a day works better. We may make some dosage adjustments. He'll give [place] a call in the next several weeks to let us know about the side effects. We also could potentially try verapamil.   Current medicines are reviewed at length with the patient today.  The patient does not have concerns regarding medicines.  The following changes have been made:      Labs/ tests ordered today include:  No orders of the defined types were placed in this encounter.    Disposition:   FU with me  in 6 months   Signed, Andrew Blasius, Wonda Cheng, MD  04/04/2016 3:45 PM    [facility] [address], [place], [place]  [address] Phone: [phone]; Fax: [phone]

## 2016-04-18 ENCOUNTER — Telehealth: Payer: Self-pay | Admitting: Cardiovascular Disease

## 2016-04-18 NOTE — Telephone Encounter (Signed)
Pt aware.

## 2016-04-18 NOTE — Telephone Encounter (Signed)
Will forward to Dr Nahser to review. 

## 2016-04-18 NOTE — Telephone Encounter (Signed)
This sounds fairly stable Continue current meds

## 2016-04-18 NOTE — Telephone Encounter (Signed)
New message   Pt is calling he verbalized that Dr.Nasher wanted him to call after two weeks and update him   On how he is feeling,   the bp is staying stable around 135/80  Fatigue is up some, no more lose stool  Pt is constipated and he is working in it with diet

## 2016-04-22 ENCOUNTER — Telehealth: Payer: Self-pay | Admitting: Internal Medicine

## 2016-04-22 DIAGNOSIS — J309 Allergic rhinitis, unspecified: Secondary | ICD-10-CM | POA: Diagnosis not present

## 2016-04-22 NOTE — Telephone Encounter (Signed)
Allergy Serum Extract Date Mixed: 04/22/16 Vial: 1 Strength: 1:10 Here/Mail/Pick Up: mail Mixed By: tbs Last OV: 04/24/15 Pending OV: 04/25/16

## 2016-04-25 ENCOUNTER — Encounter: Payer: Self-pay | Admitting: Internal Medicine

## 2016-04-25 ENCOUNTER — Ambulatory Visit (INDEPENDENT_AMBULATORY_CARE_PROVIDER_SITE_OTHER): Payer: Commercial Managed Care - PPO | Admitting: Internal Medicine

## 2016-04-25 VITALS — BP 136/80 | HR 67 | Ht 70.0 in | Wt 212.2 lb

## 2016-04-25 DIAGNOSIS — J3089 Other allergic rhinitis: Secondary | ICD-10-CM

## 2016-04-25 DIAGNOSIS — J302 Other seasonal allergic rhinitis: Secondary | ICD-10-CM

## 2016-04-25 NOTE — Patient Instructions (Signed)
We can continue allergy vaccine through this year  Please call for med refills as needed

## 2016-04-25 NOTE — Progress Notes (Signed)
Subjective:    Patient ID: Ricky Sharp, male    DOB: [birth year], 55 y.o.   MRN: [id number]  HPI 04/14/11-50 yoM never smoker followecd for asthma and allergic rhinitis, complicated by GERD.  Last here 11/25/10 for acute visit. Reports no particular problems since then except this year Spring pollen season has been worse. Eyes burn and water, nasal congestion, choking sensation. Denies chest tightness or wheeze. Mucus is yellow green with no fever or obvious infection. Has used saline rinse and antihistamines every few days. . Not using the nasal steroid or Astepro. Says with hx of reflux, he feels better if he minimizes meds, especially inhaled steroids.  Continues allergy vaccine at 1:10 GO.   04/19/12- 45 yoM never smoker followecd for asthma and allergic rhinitis, complicated by GERD Seasonal flare up-watery eyes, slight itch, and sneezing with drainage; still on vaccine and doing well. Perennial and seasonal rhinitis. Vaccine is sufficient. He rarely needs antihistamines but was interested we discussed nasal sprays. No wheezing.  04/22/13- 39 yoM never smoker followed for asthma and allergic rhinitis, complicated by GERD FOLLOWS FOR: remains on allergy vaccine 1:10 GO and doing well.  Ues Nasalcrom and saline rinse. Avoids antihistamines as much as possible. Only wheezes when he has an infection. Medications reviewed.  04/22/14- 64 yoM never smoker followed for asthma and allergic rhinitis, complicated by GERD FOLLOWS FOR: had flare up when trees were blooming-PND and watery eyes.  Still on allergy vaccine 1:10 GO Satisfied w/ care. Used Dymista, Nasalcrom during peak season in April. Uses Patanol occasionally.  04/24/15- 68 yoM never smoker followed for asthma and allergic rhinitis, complicated by GERD Follows For: Pt stays symptoms are staying under control well.  Pt c/o sinus drainage, light burning and redness in eyes. Pt states dymista seems to be helping. Still on allergy vaccine  1:10 GO- doing well and intends to continue. No asthma at all- no use of rescue inhaler  04/25/2016-55 year old male never smoker followed for asthma, allergic rhinitis, complicated by GERD Allergy Vaccine 1:10 GO FOLLOWS FOR: Pt continues allergy vaccine and denies any reactions. Pt states he is back at baseline as far as allergies at this time.   ROS-see HPI Constitutional:   No-   weight loss, night sweats, fevers, chills, fatigue, lassitude. HEENT:   No-  headaches, difficulty swallowing, tooth/dental problems, sore throat,     No-sneezing, itching, ear ache, +nasal congestion, post nasal drip,  CV:  No-   chest pain, orthopnea, PND, swelling in lower extremities, anasarca, dizziness, palpitations Resp: No-   shortness of breath with exertion or at rest.              No-   productive cough,  No non-productive cough,  No- coughing up of blood.              No-   change in color of mucus.  No- wheezing.   Skin: No-   rash or lesions. GI:  No-   heartburn, indigestion, abdominal pain, nausea, vomiting, GU:  MS:  No-   joint pain or swelling.   Neuro-     nothing unusual Psych:  No- change in mood or affect. No depression or anxiety.  No memory loss.  OBJ- Physical Exam General- Alert, Oriented, Affect-appropriate, Distress- none acute Skin- rash-none, lesions- none, excoriation- none Lymphadenopathy- none Head- atraumatic            Eyes- Gross vision intact, PERRLA, watery+conjunctivae and secretions clear.  Ears- Hearing, canals-normal            Nose- Clear, no-Septal dev, mucus, polyps, erosion, perforation             Throat- Mallampati III-IV , mucosa clear , drainage- none, tonsils- atrophic Neck- flexible , trachea midline, no stridor , thyroid nl, carotid no bruit Chest - symmetrical excursion , unlabored           Heart/CV- RRR , no murmur , no gallop  , no rub, nl s1 s2                           - JVD- none , edema- none, stasis changes- none, varices- none            Lung- clear to P&A, wheeze- none, cough- none , dullness-none, rub- none           Chest wall-  Abd-  Br/ Gen/ Rectal- Not done, not indicated Extrem- cyanosis- none, clubbing, none, atrophy- none, strength- nl Neuro- grossly intact to observation

## 2016-05-25 ENCOUNTER — Ambulatory Visit (INDEPENDENT_AMBULATORY_CARE_PROVIDER_SITE_OTHER): Payer: Commercial Managed Care - PPO | Admitting: Neurology

## 2016-05-25 ENCOUNTER — Encounter: Payer: Self-pay | Admitting: Neurology

## 2016-05-25 VITALS — BP 140/86 | HR 66 | Resp 20 | Ht 70.0 in | Wt 205.0 lb

## 2016-05-25 DIAGNOSIS — Z9989 Dependence on other enabling machines and devices: Principal | ICD-10-CM

## 2016-05-25 DIAGNOSIS — G4733 Obstructive sleep apnea (adult) (pediatric): Secondary | ICD-10-CM

## 2016-05-25 MED ORDER — MODAFINIL 200 MG PO TABS: 200.0000 mg | ORAL_TABLET | Freq: Every day | ORAL | Status: DC

## 2016-05-25 NOTE — Progress Notes (Signed)
SLEEP MED[facility]INE [facility]IC   Provider:  Larey Seat, M D  Referring Provider: Elby Showers, MD Primary Care Physician:  Elby Showers, MD  Chief Complaint  Patient presents with  . Follow-up    cpap, helps with snoring, feels a little better   Chief complaint according to patient : "I am fatigued and sleepy "   HPI:  Ricky Sharp is a 55 y.o. male , seen here as a referral from Dr. Renold Genta for a sleep evaluation,  Ricky Sharp reports that he has been a light sleeper most of his life. 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 Both parents in law have died in 04-02-13 and then in 02-Apr-2014 and just this may his mother died. He has 2 kids of college age. He feels that he has been chronically fatigued unrelenting the fatigue to a disabling degree. This is partially reflected in today's questionnaires. He endorsed the fatigue severity score at 53 points and the Epworth sleepiness score at 9 points. Part of what made him seek a sleep specialist opinion was that his wife was evaluated and diagnosed with obstructive sleep apnea and is now on CPAP. In the process of  an internal medicine workup for fatigue Dr. Renold Genta also ordered multiple metabolic panels. The patient learned that he had a thyroid dysfunction.hypo-thyroidism.  He regularly takes a multivitamin but he was not asked to shore up on vitamin D or B12 supplements. He was also not told if he is anemic or not. I reviewed the lab results from December, February and again from last August and his TSH  level has significantly come down. No anemia .He has frequent nasal allergies.   1) the severe fatigue that the patient reports could still be an aftermath of the thyroid disease. However I am strongly suspicious that the patient will have obstructive sleep apnea or at least upper airway resistance he.  He has a very narrow upper airway with a Mallampati grade 4, he does have a crowded lower jaw that is slightly hypoplastic, creating retrognathia. His spouse has already told him that he snores but she was not explicit about having witnessed apneas.The patient has already used a pulse oximetry which noted no significant desaturations. This was not a medically ordered procedure but a home test device.  2) I like for the patient also to take extra vitamin B12 or vitamin B complex and vitamin D. The supplements can help to overcome fatigue. I will not order specific blood tests for deficiencies as these will most likely be have to be covered by the patient himself.  3) I will order a attended polysomnography as a split-night study. I explained that he will sleep for about 2 hours to make the diagnosis the rest of the night can be used to treat the apnea. If his insurance carrier will not permit and attended sleep study and insists on a home sleep test he may have to return later for a titration. He does not report morning headaches and he has no underlying cardiac or lung disease so  capnography will not be necessary.  Sleep habits are as follows: The patient usually goes to bed between 10 and 11 and rises at 5 in the morning. He has always been a light sleeper as mentioned before and usually is awake before the alarm rings. A good  night would be 6 hours of sleep most nights he will accumulate about 4-1/2-5 hours of sleep. This has been going on for over a decade. He sleeps on his side, bedroom is cool and quiet and dark. His wife likes to watch TV in the bedroom.  Typically he would not have considered himself and nap time. However over the increasing fatigue over the last 12-24 month especially, he suffers from the irresistible urge to go to sleep. Sleep medical history and family sleep history:  Not known.  Social history: married, working regular office hours, non smoker, socially  ETOH drinker ,  caffeine 2 espresso daily.   Interval history from 10-07-15. Ricky Sharp is seen here today after his recent sleep study dated 09-24-15. His polysomnography revealed mild apnea 15 obstructive apneas were seen and 43 shallow breathing spells the AHI was 10.6 the RDI 10.6 the REM AHI was 30.6. Nadir was 87% oxygenation but for only 6 meet minutes. Based on the outcome of the sleep study I feel that CPAP could be helpful in treating all observed sleep disorders mild apnea, snoring, upper airway resistance he syndrome. But the patient also has retrognathia and may actually improved to a significant degree by using a dental device such as a mandibular advancement device. He has advised me of a TMJ click at the right jaw but he is not in pain. It may be possible to treat bruxism and snoring with this device and not aggravate the TM joint. Based on his degree of apnea can go either way,  CPAP or dental device. A dental device would be manufactured to the specification of the sleep lab in the treatment of a medical condition and therefore would run through his general health insurance and not through a dental insurance. Will order auto CPAP. 5-12 cm water. Mask of patients choice.   History from 11-26-15. Ricky Sharp has numeric done very well with the CPAP. He is an 83% compliance for over 4 hours of daily use the average user time is 6 hours 42 minutes. He is using an AutoSet which allows pressure changes between 5 and 12 cm. EPR is set at 3 cm. The 91st percentile of pressure used at night came out at 7.5 cm water.  His AHI is 1.3 which is an excellent resolution of apnea which was at baseline only 10.6 but during REM sleep 30.6.  He has no significant air leaks and the pressure seems to be stable. He endorses today the Epworth sleepiness score at 9 but the fatigue severity score  remains very high at 55 points. He feels at best a marginal improvement but he would engage at about 10%. It is not insomnia at  night so the apnea is numerically treated but he remains excessively fatigued not so much sleepy. The question is if we can place him on a medication for fatigue. Aside from his sleep apnea there is no other sleep disorder that needs to be addressed. He has had a recent appointment with his primary care physician Dr. Dawna Part, and he and his primary care physician R looking further for reasons of significant fatigue. As it stands now the apnea is not the cause for the continued fatigue but it would be FDA approved to write modafinil for him to treat fatigue.  Interval history from 05/25/2016, Ricky Sharp presents with a 80% compliance for the last 30 days his average user time for CPAP on days used is 7 hours and 3 minutes and  over all days 5 hours 40 minutes. His AHI is 1.2 and he has continued to use AutoSet, between 5 and 12 cm water with 3 cm EPR there is no major air leak noted at the apnea is very well controlled. His fatigue is a little decreased in comparison to a year ago 41 points on the fatigue severity score and 11 points on the Epworth sleepiness score   Review of Systems: Out of a complete 14 system review, the patient complains of only the following symptoms, and all other reviewed systems are negative.  Mild OSA,  Epworth score 11 , Fatigue severity score 41 from 55   , depression score n/a, anxiety - has side effects to all SSRI until low dose lexapro was used.    Social History   Social History  . Marital Status: Married    Spouse Name: N/A  . Number of Children: N/A  . Years of Education: N/A   Occupational History  . network admin    Social History Main Topics  . Smoking status: Never Smoker   . Smokeless tobacco: Not on file  . Alcohol Use: No  . Drug Use: No  . Sexual Activity: Not on file   Other Topics Concern  . Not on file   Social History Narrative    Family History  Problem Relation Age of Onset  . Adopted: Yes  . Fibromyalgia Mother   .  Syncope episode Mother   . Lung disease Mother     History reviewed. No pertinent past medical history.  Past Surgical History  Procedure Laterality Date  . Cholecystectomy    . Tonsillectomy    . Osteochondroma excision      Current Outpatient Prescriptions  Medication Sig Dispense Refill  . ALPRAZolam (XANAX) 0.25 MG tablet Take 1 tablet (0.25 mg total) by mouth 2 (two) times daily as needed for anxiety. 60 tablet 0  . CARAFATE 1 GM/10ML suspension Take by mouth as directed.     . cromolyn (NASALCROM) 5.2 MG/ACT nasal spray Place 1 spray into the nose 4 (four) times daily.      Marland Kitchen dexlansoprazole (DEXILANT) 60 MG capsule Take 60 mg by mouth daily as needed.     . diltiazem (DILACOR XR) 180 MG 24 hr capsule Take 1 capsule (180 mg total) by mouth daily. 30 capsule 11  . DYMISTA 137-50 MCG/ACT SUSP USE 1-2 SPRAYS IN EACH NOSTRIL AT BEDTIME AS DIRECTED 23 g PRN  . EPINEPHrine (EPIPEN JR) 0.15 MG/0.3 ML injection Inject into thigh if needed for severe allergic reavtion 1 each prn  . escitalopram (LEXAPRO) 10 MG tablet TAKE (1/2) TABLET DAILY. 45 tablet 1  . hydrochlorothiazide (HYDRODIURIL) 25 MG tablet Take 1 tablet (25 mg total) by mouth daily. 30 tablet 11  . ibuprofen (ADVIL,MOTRIN) 200 MG tablet Take 200 mg by mouth every 6 (six) hours as needed.      Marland Kitchen levothyroxine (SYNTHROID, LEVOTHROID) 88 MCG tablet TAKE 1 TABLET ONCE DAILY. 34 tablet 5  . modafinil (PROVIGIL) 200 MG tablet Take 1 tablet (200 mg total) by mouth daily. 30 tablet 0  . Multiple Vitamins-Minerals (CENTRUM SILVER) tablet Take 1 tablet by mouth daily.    . NONFORMULARY OR COMPOUNDED ITEM Allergy Vaccine 1:10 Given at Home    . olopatadine (PATANOL) 0.1 % ophthalmic solution Place 1 drop into both eyes 2 (two) times daily. As needed 5 mL prn  . Omega-3 Fatty Acids (FISH OIL) 1000 MG CAPS Take 1 capsule by mouth  every evening.    . ORACEA 40 MG capsule     . potassium chloride (K-DUR) 10 MEQ tablet TAKE (2) TABLETS  TWICE DAILY. 120 tablet 2  . triamcinolone cream (KENALOG) 0.1 % Apply 1 application topically 2 (two) times daily. 45 g 2   No current facility-administered medications for this visit.    Allergies as of 05/25/2016 - Review Complete 05/25/2016  Allergen Reaction Noted  . Lisinopril  01/01/2015  . Losartan  09/11/2014    Vitals: BP 140/86 mmHg  Pulse 66  Resp 20  Ht 5\' 10"  (1.778 m)  Wt 205 lb (92.987 kg)  BMI 29.41 kg/m2 Last Weight:  Wt Readings from Last 1 Encounters:  05/25/16 205 lb (92.987 kg)   [id number] mass index is 29.41 kg/(m^2).     Last Height:   Ht Readings from Last 1 Encounters:  05/25/16 5\' 10"  (1.778 m)    Physical exam:  General: The patient is awake, alert and appears not in acute distress. The patient is well groomed. Head: Normocephalic, atraumatic. Neck is supple. Mallampati 4, uvula is not visible even with use of tongue depression. neck circumference: 17 . Nasal airflow unrestricted , TMJ is not evident . Retrognathia is seen.  Cardiovascular:  Regular rate and rhythm , without  murmurs or carotid bruit, and without distended neck veins. Respiratory: Lungs are clear to auscultation. Skin:  Without evidence of edema, or rash  BMI is elevated, patient's posture is erect    Neurologic exam : The patient is awake and alert, oriented to place and time.   Memory subjective described as intact.  Attention span & concentration ability appears normal.  Speech is fluent,  without dysarthria, dysphonia or aphasia.  Mood and affect are appropriate.  Cranial nerves: Reports loss of smell, but not of taste. Pupils are equal and briskly reactive to light.  Visual fields by finger perimetry are intact. Hearing to finger rub intact. Facial sensation intact to fine touch.  Facial motor strength is symmetric and tongue and uvula move midline. Shoulder shrug was symmetrical.   The patient was advised of the nature of the diagnosed sleep disorder , the treatment  options and risks for general a health and wellness arising from not treating the condition.  I spent more than 15 minutes of face to face time with the patient.  Greater than 50% of time was spent in counseling and coordination of care. We have discussed the diagnosis and differential and I answered the patient's questions.    I will order Modafinil after review of clinical symptoms and CPAP download form AUTOPAP.   Assessment:  After physical and neurologic examination, review of laboratory studies,  Personal review of imaging studies, reports of other /same  Imaging studies ,  Results of polysomnography/ neurophysiology testing and pre-existing records as far as provided in visit., my assessment is   MIld apnea , snoring and UARS, retrognathia.   1) continue  auto CPAP 5-12 cm water, mask of patient s choice. Compliance is good.    2) Modafinil prn, refilled.      Asencion Partridge [place] MD  05/25/2016   CC: Elby Showers, Md [address] [place], [place] [address]

## 2016-06-06 ENCOUNTER — Ambulatory Visit (INDEPENDENT_AMBULATORY_CARE_PROVIDER_SITE_OTHER): Payer: Commercial Managed Care - PPO | Admitting: Internal Medicine

## 2016-06-06 ENCOUNTER — Ambulatory Visit
Admission: RE | Admit: 2016-06-06 | Discharge: 2016-06-06 | Disposition: A | Discharge status: Home / Self Care | Payer: Commercial Managed Care - PPO | Source: Ambulatory Visit | Attending: Internal Medicine | Admitting: Internal Medicine

## 2016-06-06 ENCOUNTER — Encounter: Payer: Self-pay | Admitting: Internal Medicine

## 2016-06-06 VITALS — BP 150/90 | HR 73 | Temp 99.1°F | Resp 18 | Wt 212.5 lb

## 2016-06-06 DIAGNOSIS — I10 Essential (primary) hypertension: Secondary | ICD-10-CM | POA: Diagnosis not present

## 2016-06-06 DIAGNOSIS — R1030 Lower abdominal pain, unspecified: Secondary | ICD-10-CM

## 2016-06-06 DIAGNOSIS — K5792 Diverticulitis of intestine, part unspecified, without perforation or abscess without bleeding: Secondary | ICD-10-CM

## 2016-06-06 DIAGNOSIS — F411 Generalized anxiety disorder: Secondary | ICD-10-CM

## 2016-06-06 LAB — POCT URINALYSIS DIPSTICK
BILIRUBIN UA: NEGATIVE
Glucose, UA: NEGATIVE
KETONES UA: NEGATIVE
Leukocytes, UA: NEGATIVE
Nitrite, UA: NEGATIVE
PH UA: 6.5
Protein, UA: NEGATIVE
RBC UA: NEGATIVE
Spec Grav, UA: 1.015
Urobilinogen, UA: 0.2

## 2016-06-06 LAB — CBC WITH DIFFERENTIAL/PLATELET
BASOS PCT: 0 %
Basophils Absolute: 0 cells/uL (ref 0–200)
EOS ABS: 228 {cells}/uL (ref 15–500)
EOS PCT: 2 %
HCT: 43 % (ref 38.5–50.0)
Hemoglobin: 15.7 g/dL (ref 13.2–17.1)
LYMPHS PCT: 15 %
Lymphs Abs: 1710 cells/uL (ref 850–3900)
MCH: 31.1 pg (ref 27.0–33.0)
MCHC: 36.5 g/dL — ABNORMAL HIGH (ref 32.0–36.0)
MCV: 85.1 fL (ref 80.0–100.0)
MONOS PCT: 12 %
MPV: 10.3 fL (ref 7.5–12.5)
Monocytes Absolute: 1368 cells/uL — ABNORMAL HIGH (ref 200–950)
NEUTROS ABS: 8094 {cells}/uL — AB (ref 1500–7800)
Neutrophils Relative %: 71 %
PLATELETS: 239 10*3/uL (ref 140–400)
RBC: 5.05 MIL/uL (ref 4.20–5.80)
RDW: 13.1 % (ref 11.0–15.0)
WBC: 11.4 10*3/uL — AB (ref 3.8–10.8)

## 2016-06-06 MED ORDER — CIPROFLOXACIN HCL 500 MG PO TABS: 500.0000 mg | ORAL_TABLET | Freq: Two times a day (BID) | ORAL | Status: DC

## 2016-06-06 MED ORDER — METRONIDAZOLE 500 MG PO TABS: 500.0000 mg | ORAL_TABLET | Freq: Two times a day (BID) | ORAL | Status: DC

## 2016-06-06 MED ORDER — IOPAMIDOL (ISOVUE-300) INJECTION 61%
125.0000 mL | Freq: Once | INTRAVENOUS | Status: AC | PRN
  Administered 2016-06-06: 125 mL via INTRAVENOUS

## 2016-06-06 NOTE — Patient Instructions (Addendum)
   Rest and stay on clear liquids for 3 days with slow advancement to soft diet. Return in one week. Take antibiotics as prescribed.

## 2016-06-06 NOTE — Progress Notes (Signed)
   Subjective:    Patient ID: Ricky Sharp, male    DOB: 17-Apr-1961, 55 y.o.   MRN: [id number]  HPI 55 year old Male in today with a 3 day history of lower abdominal pain. He's had no nausea vomiting or diarrhea. Feels a little constipated with new medication prescribed by cardiologist ( diltiazem). Amlodipine caused diarrhea.  Describes having a somewhat bloated feeling in his lower abdomen. Says he's had low-grade fever. No shaking chills. No urinary symptoms.  Urine specimen checked by dipstick today is negative. CBC with differential drawn and pending.  Past medical history: History of asthma and GE reflux treated with Dexilant as well as hypothyroidism.  Hospitalized in 2003 with chest pain and epigastric pain. Had upper endoscopy. Was diagnosed with peptic ulcer disease secondary to H. pylori infection.  History of sleep apnea. C Pap as been prescribed.  History of cholecystectomy.    Review of Systems see above     Objective:   Physical Exam Abdomen is soft nondistended without hepatosplenomegaly or masses. He's fairly tender in his lower abdomen bilaterally with rebound tenderness being present.       Assessment & Plan:  Acute lower abdominal pain for 3 days. CBC with differential pending. Urine by dip stick negative for UTI. Suspect appendicitis or diverticulitis  Plan: CT of abdomen and pelvis with contrast.  Addendum: White blood cell count is 11,400 with slight left shift. CT scan shows one diverticulum that has surrounding edema and inflammation. He will start Cipro 500 mg twice daily for 10 days. Flagyl 500 mg twice daily for 7 days. Stay on clear liquids for 3 days and advance to soft diet slowly. Call if symptoms worsen. Tylenol if needed for pain. Return in one week or sooner if worse.  35 minutes spent with patient

## 2016-06-20 ENCOUNTER — Ambulatory Visit (INDEPENDENT_AMBULATORY_CARE_PROVIDER_SITE_OTHER): Payer: Commercial Managed Care - PPO | Admitting: Internal Medicine

## 2016-06-20 ENCOUNTER — Encounter: Payer: Self-pay | Admitting: Internal Medicine

## 2016-06-20 VITALS — BP 128/84 | HR 66 | Temp 97.9°F | Resp 18 | Ht 70.0 in | Wt 215.0 lb

## 2016-06-20 DIAGNOSIS — K5732 Diverticulitis of large intestine without perforation or abscess without bleeding: Secondary | ICD-10-CM

## 2016-06-20 NOTE — Patient Instructions (Signed)
Continue to advance diet to regular diet over the next 2 weeks. Follow-up with Dr. Collene Mares in about 6 weeks

## 2016-06-20 NOTE — Progress Notes (Signed)
   Subjective:    Patient ID: Ricky Sharp, male    DOB: [birth year], 55 y.o.   MRN: [id number]  HPI   Patient was here June 26 with lower abdominal pain. Was found to have acute diverticulitis with one inflamed diverticulum on CT scan. White blood cell count was 11,400. He was started on Cipro 500 mg twice daily for 10 days and Cipro 500 mg twice daily for 7 days. He has finished antibiotics. He has slowly advanced his diet to a soft diet. He is much better. No nausea vomiting diarrhea or pain. He says antibiotics have caused some gas and bloating but otherwise he is markedly improved.    Review of Systems     Objective:   Physical Exam  Abdomen is soft nondistended without hepatosplenomegaly masses or significant tenderness      Assessment & Plan:  Diverticulitis-resolved  Plan: In 6 weeks, patient is to see Dr. Collene Mares for evaluation. He will call and make his own appointment.

## 2016-06-23 ENCOUNTER — Ambulatory Visit: Payer: Commercial Managed Care - PPO | Admitting: Internal Medicine

## 2016-06-28 ENCOUNTER — Other Ambulatory Visit: Payer: Commercial Managed Care - PPO | Admitting: Internal Medicine

## 2016-06-28 DIAGNOSIS — E039 Hypothyroidism, unspecified: Secondary | ICD-10-CM

## 2016-06-28 LAB — TSH: TSH: 1.67 m[IU]/L (ref 0.40–4.50)

## 2016-07-01 ENCOUNTER — Ambulatory Visit (INDEPENDENT_AMBULATORY_CARE_PROVIDER_SITE_OTHER): Payer: Commercial Managed Care - PPO | Admitting: Internal Medicine

## 2016-07-01 ENCOUNTER — Encounter: Payer: Self-pay | Admitting: Internal Medicine

## 2016-07-01 VITALS — BP 140/88 | HR 64 | Temp 97.2°F | Resp 16 | Ht 70.0 in | Wt 214.0 lb

## 2016-07-01 DIAGNOSIS — I10 Essential (primary) hypertension: Secondary | ICD-10-CM | POA: Diagnosis not present

## 2016-07-01 DIAGNOSIS — E039 Hypothyroidism, unspecified: Secondary | ICD-10-CM

## 2016-07-01 DIAGNOSIS — K5732 Diverticulitis of large intestine without perforation or abscess without bleeding: Secondary | ICD-10-CM

## 2016-07-01 DIAGNOSIS — F439 Reaction to severe stress, unspecified: Secondary | ICD-10-CM

## 2016-07-01 DIAGNOSIS — Z658 Other specified problems related to psychosocial circumstances: Secondary | ICD-10-CM | POA: Diagnosis not present

## 2016-07-01 NOTE — Patient Instructions (Addendum)
Continue same dose of thyroid replacement and antihypertensive medication. Follow-up with Dr. Collene Mares regarding diverticulitis episode recently and RTC in 6 months for physical examination.

## 2016-07-01 NOTE — Progress Notes (Signed)
   Subjective:    Patient ID: Ricky Sharp, male    DOB: April 07, 1961, 55 y.o.   MRN: [id number]  HPI For follow up    Review of Systems     Objective:   Physical Exam        Assessment & Plan:

## 2016-07-04 ENCOUNTER — Encounter: Payer: Self-pay | Admitting: Internal Medicine

## 2016-07-04 NOTE — Progress Notes (Signed)
   Subjective:    Patient ID: Ricky Sharp, male    DOB: [birth year], 55 y.o.   MRN: [id number]  HPI  In today to follow-up on hypothyroidism. TSH drawn recently and is entirely within normal limits. He also has hypertension. Has element of white coat hypertension. I checked his blood pressure several times today and it remains elevated. I think it runs fine at home.  No longer in counseling with regard to marital issues. Did not seem to be helping him.    Review of Systems No new complaints. Is going to follow-up after recent bout of  diverticulitis with Dr. Collene Mares.    Objective:   Physical Exam No thyromegaly. Chest clear. Cardiac exam regular rate and rhythm normal S1 and S2. Extremities without edema       Assessment & Plan:  Hypothyroidism-stable on current dose of Synthroid recheck in 6 months at time of physical exam  Acute diverticulitis-recently treated and diagnosed. Follow-up with gastroenterologist  Situational stress with marriage  Essential hypertension-continue same medication and recognize he has element of office hypertension  Plan: Physical exam due in 6 months. Continue same medications. Patient to continue to monitor blood pressure at home and continue same dose of thyroid replacement.

## 2016-07-25 ENCOUNTER — Telehealth: Payer: Self-pay | Admitting: Internal Medicine

## 2016-07-25 NOTE — Telephone Encounter (Signed)
Patient states he has stitches in his Right index finger (8 stitches).  Was told to have them removed about Wednesday of this week.  He went back to the doc in a box on Sunday to have them removed and was told he needed to wait about 3 more days.  He wants to know if you will take them out for him this week.   Says he's tired of seeing the doc in a box people and wants to know if his doctor will see him and take care of this.   Please advise.

## 2016-07-25 NOTE — Telephone Encounter (Signed)
He can come at 11am on Wednesday

## 2016-07-26 NOTE — Telephone Encounter (Signed)
Spoke with patient and advised to be here Wednesday, 8/16 @ 11:00 a.m. For appointment.  Patient is thrilled.

## 2016-07-27 ENCOUNTER — Encounter: Payer: Self-pay | Admitting: Internal Medicine

## 2016-07-27 ENCOUNTER — Ambulatory Visit (INDEPENDENT_AMBULATORY_CARE_PROVIDER_SITE_OTHER): Payer: Commercial Managed Care - PPO | Admitting: Internal Medicine

## 2016-07-27 DIAGNOSIS — S6000XD Contusion of unspecified finger without damage to nail, subsequent encounter: Secondary | ICD-10-CM | POA: Diagnosis not present

## 2016-07-27 DIAGNOSIS — S61310S Laceration without foreign body of right index finger with damage to nail, sequela: Secondary | ICD-10-CM | POA: Diagnosis not present

## 2016-07-27 DIAGNOSIS — S6010XD Contusion of unspecified finger with damage to nail, subsequent encounter: Secondary | ICD-10-CM

## 2016-07-27 NOTE — Patient Instructions (Signed)
Keep Polysporin ointment twice daily on healing laceration for 5 days. Do range of motion exercises with right index finger. Return when necessary. Subungual hematoma will resolve with time.

## 2016-07-27 NOTE — Progress Notes (Addendum)
   Subjective:    Patient ID: Ricky Sharp, male    DOB: September 04, 1961, 56 y.o.   MRN: [id number]  HPI Here for suture removal Right  index finger after lacerating it with a wrench on a weekend. Seen at [facility] August 6th  and had 8 sutures placed. He returned there August 13th and they felt sutures were not quite ready for removal. He wanted to come here to have them removed.    Review of Systems     Objective:   Physical Exam  Dorsum of right index finger  shows 8 sutures placed interrupted. Wound is healing well. These were removed without difficulty. No evidence of secondary infection. Good range of motion in right index finger. He has a subungual hematoma.      Assessment & Plan:  Right index finger laceration follow-up and suture removal Right index finger subungual hematoma  Plan: Use Polysporin ointment twice daily for the next 5 days on the wound. Return when necessary.

## 2016-09-05 ENCOUNTER — Other Ambulatory Visit: Payer: Self-pay | Admitting: Internal Medicine

## 2016-09-13 ENCOUNTER — Other Ambulatory Visit: Payer: Self-pay | Admitting: Internal Medicine

## 2016-09-19 ENCOUNTER — Encounter: Payer: Self-pay | Admitting: Internal Medicine

## 2016-09-19 ENCOUNTER — Ambulatory Visit (INDEPENDENT_AMBULATORY_CARE_PROVIDER_SITE_OTHER): Payer: Commercial Managed Care - PPO | Admitting: Internal Medicine

## 2016-09-19 VITALS — BP 140/96 | HR 60 | Temp 97.7°F | Wt 216.0 lb

## 2016-09-19 DIAGNOSIS — R519 Headache, unspecified: Secondary | ICD-10-CM

## 2016-09-19 DIAGNOSIS — Z23 Encounter for immunization: Secondary | ICD-10-CM | POA: Diagnosis not present

## 2016-09-19 DIAGNOSIS — R51 Headache: Secondary | ICD-10-CM | POA: Diagnosis not present

## 2016-09-19 MED ORDER — LEVOFLOXACIN 500 MG PO TABS: 500.0000 mg | ORAL_TABLET | Freq: Every day | ORAL | 0 refills | Status: DC

## 2016-09-19 NOTE — Patient Instructions (Signed)
Warm hot compresses behind right ear for 20 minutes once or twice daily. Levaquin 500 milligrams daily for 7 days. Flu vaccine given.

## 2016-09-19 NOTE — Progress Notes (Signed)
   Subjective:    Patient ID: Ricky Sharp, male    DOB: July 09, 1961, 55 y.o.   MRN: [id number]  HPI  55 year old male in today with complaint of pain behind right ear. Has noticed a small nodular area present for a few days. Has had some sinus symptoms. No fever or chills. No headache.    Review of Systems as above     Objective:   Physical Exam  Approximate 2.5 mm nodule which is circular and erythematous behind right ear. Tender to touch. No drainage.      Assessment & Plan:  Postauricular nodule  Postauricular pain  Plan: Warm hot compresses once or twice daily behind right ear. Levaquin 500 milligrams daily for 7 days. Call if not better and resolved in 7-10 days. Flu vaccine given.

## 2016-10-04 ENCOUNTER — Other Ambulatory Visit: Payer: Self-pay | Admitting: Dermatology

## 2016-10-06 ENCOUNTER — Ambulatory Visit (INDEPENDENT_AMBULATORY_CARE_PROVIDER_SITE_OTHER): Payer: Commercial Managed Care - PPO | Admitting: Internal Medicine

## 2016-10-06 ENCOUNTER — Encounter: Payer: Self-pay | Admitting: Internal Medicine

## 2016-10-06 ENCOUNTER — Telehealth: Payer: Self-pay | Admitting: Internal Medicine

## 2016-10-06 VITALS — BP 158/90 | HR 86 | Wt 214.0 lb

## 2016-10-06 DIAGNOSIS — H6122 Impacted cerumen, left ear: Secondary | ICD-10-CM

## 2016-10-06 NOTE — Telephone Encounter (Signed)
Spoke with patient @ [phone]; advised we will see him today at 4:30.

## 2016-10-06 NOTE — Telephone Encounter (Signed)
Patient complaining of Left ear pain x 2-4 days.  States that he tried sleeping on that ear hoping it would drain and feel better.  However, still hurting and wants to know if we can work him in to be seen today.  No fever, just hurts.  Please advise.

## 2016-10-06 NOTE — Telephone Encounter (Signed)
See at 4:30 pm

## 2016-10-06 NOTE — Telephone Encounter (Signed)
Scheduled

## 2016-10-07 NOTE — Patient Instructions (Signed)
Ear wax removed from left external ear canal successfully with curet

## 2016-10-07 NOTE — Progress Notes (Signed)
   Subjective:    Patient ID: Ricky Sharp, male    DOB: June 12, 1961, 55 y.o.   MRN: [id number]  HPI Complaining of left ear fullness and popping for the past 2 days. Cannot hear out of left ear.    Review of Systems     Objective:   Physical Exam Impacted cerumen left external ear canal removed with curet. Left TM is clear       Assessment & Plan:  Impacted cerumen left ear  Plan: Successful removal with curette. No further treatment needed.

## 2016-10-09 ENCOUNTER — Other Ambulatory Visit: Payer: Self-pay | Admitting: Internal Medicine

## 2016-11-30 ENCOUNTER — Other Ambulatory Visit: Payer: Self-pay | Admitting: Cardiovascular Disease

## 2016-12-13 ENCOUNTER — Other Ambulatory Visit: Payer: Commercial Managed Care - PPO | Admitting: Internal Medicine

## 2016-12-13 DIAGNOSIS — Z1159 Encounter for screening for other viral diseases: Secondary | ICD-10-CM

## 2016-12-13 DIAGNOSIS — I10 Essential (primary) hypertension: Secondary | ICD-10-CM

## 2016-12-13 DIAGNOSIS — Z0000 Encounter for general adult medical examination without abnormal findings: Secondary | ICD-10-CM

## 2016-12-13 DIAGNOSIS — E039 Hypothyroidism, unspecified: Secondary | ICD-10-CM

## 2016-12-13 LAB — CBC WITH DIFFERENTIAL/PLATELET
BASOS PCT: 1 %
Basophils Absolute: 63 cells/uL (ref 0–200)
EOS ABS: 126 {cells}/uL (ref 15–500)
Eosinophils Relative: 2 %
HEMATOCRIT: 49.1 % (ref 38.5–50.0)
HEMOGLOBIN: 16.8 g/dL (ref 13.2–17.1)
LYMPHS ABS: 1764 {cells}/uL (ref 850–3900)
Lymphocytes Relative: 28 %
MCH: 31 pg (ref 27.0–33.0)
MCHC: 34.2 g/dL (ref 32.0–36.0)
MCV: 90.6 fL (ref 80.0–100.0)
MONO ABS: 567 {cells}/uL (ref 200–950)
MPV: 10 fL (ref 7.5–12.5)
Monocytes Relative: 9 %
Neutro Abs: 3780 cells/uL (ref 1500–7800)
Neutrophils Relative %: 60 %
Platelets: 255 10*3/uL (ref 140–400)
RBC: 5.42 MIL/uL (ref 4.20–5.80)
RDW: 14.1 % (ref 11.0–15.0)
WBC: 6.3 10*3/uL (ref 3.8–10.8)

## 2016-12-13 LAB — HEPATITIS C ANTIBODY
HCV Ab: NEGATIVE
HCV Ab: NEGATIVE

## 2016-12-13 NOTE — Addendum Note (Signed)
Addended by: Amado Coe on: 12/13/2016 11:15 AM   Modules accepted: Orders

## 2016-12-14 LAB — COMPLETE METABOLIC PANEL WITH GFR
ALT: 49 U/L — AB (ref 9–46)
AST: 26 U/L (ref 10–35)
Albumin: 4.5 g/dL (ref 3.6–5.1)
Alkaline Phosphatase: 64 U/L (ref 40–115)
BUN: 16 mg/dL (ref 7–25)
CALCIUM: 9.5 mg/dL (ref 8.6–10.3)
CO2: 27 mmol/L (ref 20–31)
CREATININE: 0.97 mg/dL (ref 0.70–1.33)
Chloride: 102 mmol/L (ref 98–110)
GFR, EST NON AFRICAN AMERICAN: 87 mL/min (ref >=60)
Glucose, Bld: 93 mg/dL (ref 65–99)
POTASSIUM: 4.3 mmol/L (ref 3.5–5.3)
Sodium: 139 mmol/L (ref 135–146)
Total Bilirubin: 0.6 mg/dL (ref 0.2–1.2)
Total Protein: 7.5 g/dL (ref 6.1–8.1)

## 2016-12-14 LAB — PSA: PSA: 0.2 ng/mL (ref <=4.0)

## 2016-12-14 LAB — TSH: TSH: 2.5 m[IU]/L (ref 0.40–4.50)

## 2016-12-14 LAB — LIPID PANEL
CHOL/HDL RATIO: 6.9 ratio — AB (ref <5.0)
Cholesterol: 193 mg/dL (ref <200)
HDL: 28 mg/dL — AB (ref >40)
LDL Cholesterol: 104 mg/dL — ABNORMAL HIGH (ref <100)
TRIGLYCERIDES: 306 mg/dL — AB (ref <150)
VLDL: 61 mg/dL — AB (ref <30)

## 2016-12-14 LAB — HIV ANTIBODY (ROUTINE TESTING W REFLEX)
HIV: NONREACTIVE
HIV: NONREACTIVE

## 2016-12-20 ENCOUNTER — Other Ambulatory Visit: Payer: Commercial Managed Care - PPO | Admitting: Internal Medicine

## 2016-12-22 ENCOUNTER — Encounter: Payer: Commercial Managed Care - PPO | Admitting: Internal Medicine

## 2017-02-10 ENCOUNTER — Encounter: Payer: Self-pay | Admitting: Internal Medicine

## 2017-02-10 ENCOUNTER — Ambulatory Visit (INDEPENDENT_AMBULATORY_CARE_PROVIDER_SITE_OTHER): Payer: Commercial Managed Care - PPO | Admitting: Internal Medicine

## 2017-02-10 VITALS — BP 140/88 | HR 91 | Temp 98.4°F | Ht 70.25 in | Wt 214.0 lb

## 2017-02-10 DIAGNOSIS — F411 Generalized anxiety disorder: Secondary | ICD-10-CM | POA: Diagnosis not present

## 2017-02-10 DIAGNOSIS — I10 Essential (primary) hypertension: Secondary | ICD-10-CM

## 2017-02-10 DIAGNOSIS — J3089 Other allergic rhinitis: Secondary | ICD-10-CM

## 2017-02-10 DIAGNOSIS — G4719 Other hypersomnia: Secondary | ICD-10-CM

## 2017-02-10 DIAGNOSIS — Z0000 Encounter for general adult medical examination without abnormal findings: Secondary | ICD-10-CM

## 2017-02-10 DIAGNOSIS — Z8719 Personal history of other diseases of the digestive system: Secondary | ICD-10-CM | POA: Diagnosis not present

## 2017-02-10 DIAGNOSIS — K219 Gastro-esophageal reflux disease without esophagitis: Secondary | ICD-10-CM

## 2017-02-10 DIAGNOSIS — J302 Other seasonal allergic rhinitis: Secondary | ICD-10-CM

## 2017-02-10 DIAGNOSIS — E039 Hypothyroidism, unspecified: Secondary | ICD-10-CM | POA: Diagnosis not present

## 2017-02-10 DIAGNOSIS — E781 Pure hyperglyceridemia: Secondary | ICD-10-CM | POA: Diagnosis not present

## 2017-02-10 DIAGNOSIS — G4733 Obstructive sleep apnea (adult) (pediatric): Secondary | ICD-10-CM | POA: Diagnosis not present

## 2017-02-10 LAB — POCT URINALYSIS DIPSTICK
Bilirubin, UA: NEGATIVE
Blood, UA: NEGATIVE
GLUCOSE UA: NEGATIVE
Ketones, UA: NEGATIVE
Leukocytes, UA: NEGATIVE
NITRITE UA: NEGATIVE
Protein, UA: NEGATIVE
Spec Grav, UA: 1.015
UROBILINOGEN UA: NEGATIVE
pH, UA: 6.5

## 2017-02-10 NOTE — Progress Notes (Signed)
Subjective:    Patient ID: Ricky Sharp, male    DOB: [birth year], 56 y.o.   MRN: [id number]  HPI  56 year old Male for health maintenance exam and evaluation of medical issues.   Has a history of essential hypertension, hypothyroidism, anxiety, situational stress, hypokalemia, excessive daytime sleepiness, sleep apnea and snoring as well as fatigue.  Cardiologist is Dr. Acie Fredrickson. Patient has been tried on losartan but did not tolerate it. He is on diuretic for blood pressure control in addition to amlodipine.  History of asthma and GE reflux. Is on Dexilant for reflux.  Hypothyroidism diagnosed January 2015.  He takes Lexapro 10 mg daily.  Has seen neurologist regarding excessive daytime sleepiness. See Pap has been prescribed for sleep apnea and snoring the patient still feels tired.  History of asthma and allergic rhinitis. Has seen Dr. Annamaria Boots. Receives allergy vaccines. Perennial and seasonal rhinitis. Tends to wheeze with respiratory infections.  In July 2017 he had acute diverticulitis sigmoid colon treated with by mouth antibiotics.  Cholecystectomy 2004.  Negative myocardial perfusion study 2003.   He was admitted to the hospital in 2003 with chest pain and epigastric pain. He had an upper endoscopy and was diagnosed with peptic ulcer disease secondary to H. pylori infection. MI was ruled out. He had a negative Cardiolite study.  Past medical history: Fractured left clavicle 1981. History of hepatitis 1966. In 1998, he had a positive hepatitis B antibody. History of allergic rhinitis. Wisdom teeth extracted 1982. Osteochondroma removed from right tibia in 1983. Kidney stone 2003.  Laceration of right hand August 2017 requiring sutures.   Family history: Mother with history of asthma died with heart failure. No siblings. Does not know his biological father.  Social history: He's married. He has 2 daughters. One daughter is a Writer of [employer] currently  looking for job or internship and the other daughter is in college at the [place] [employer]. Wife is an Forensic psychologist. He works in Engineer, technical sales at [employer].      Review of Systems  Constitutional: Positive for fatigue.  Respiratory: Negative.   Cardiovascular: Negative.   Gastrointestinal: Negative.    see above     Objective:   Physical Exam  Constitutional: He is oriented to person, place, and time. He appears well-developed and well-nourished. No distress.  HENT:  Head: Normocephalic and atraumatic.  Right Ear: External ear normal.  Left Ear: External ear normal.  Mouth/Throat: Oropharynx is clear and moist.  Eyes: Conjunctivae and EOM are normal. Pupils are equal, round, and reactive to light. Right eye exhibits no discharge. Left eye exhibits no discharge. No scleral icterus.  Neck: Neck supple. No JVD present. No thyromegaly present.  Cardiovascular: Normal rate, regular rhythm, normal heart sounds and intact distal pulses.   No murmur heard. Abdominal: Soft. Bowel sounds are normal. He exhibits no distension and no mass. There is no tenderness. There is no rebound and no guarding.  Genitourinary: Prostate normal.  Musculoskeletal: He exhibits no edema.  Lymphadenopathy:    He has no cervical adenopathy.  Neurological: He is alert and oriented to person, place, and time. He has normal reflexes. He displays normal reflexes. No cranial nerve deficit. Coordination normal.  Skin: Skin is warm and dry. No rash noted. He is not diaphoretic.  Psychiatric: He has a normal mood and affect. His behavior is normal. Judgment and thought content normal.  Vitals reviewed.         Assessment & Plan:  Essential hypertension-currently on till a core and HCTZ  Hypothyroidism-normal TSH on current dose of thyroid replacement. Current dose syrup 0.88 mg daily  Sleep apnea-treated with CPap  Fatigue  Excessive daytime sleepiness-patient is on Provigil per neurologist  Allergic  rhinitis-treated with Dymista, Patanol, Nasalcrom  Anxiety treated with Xanax sparingly in addition to Lexapro  Hypertriglyceridemia-significant increase in lipids from 2016 at which time triglycerides were 182. 2 years ago triglycerides were 160.  Plan: Begin diet exercise regimen. Has not been riding bike as much as she use to. Return in 6 months for follow-up on hyperlipidemia. TSH will be checked at that time as well.

## 2017-02-14 ENCOUNTER — Other Ambulatory Visit: Payer: Self-pay | Admitting: Internal Medicine

## 2017-02-25 NOTE — Patient Instructions (Addendum)
It was a pleasure to see you today. Begin diet and exercise regimen and follow-up in 6 months. Continue same medications.

## 2017-03-02 ENCOUNTER — Encounter: Payer: Self-pay | Admitting: Internal Medicine

## 2017-03-02 ENCOUNTER — Ambulatory Visit (INDEPENDENT_AMBULATORY_CARE_PROVIDER_SITE_OTHER): Payer: Commercial Managed Care - PPO | Admitting: Internal Medicine

## 2017-03-02 VITALS — BP 140/90 | HR 72 | Temp 97.2°F | Ht 70.25 in | Wt 219.0 lb

## 2017-03-02 DIAGNOSIS — J0100 Acute maxillary sinusitis, unspecified: Secondary | ICD-10-CM | POA: Diagnosis not present

## 2017-03-02 MED ORDER — METHYLPREDNISOLONE ACETATE 80 MG/ML IJ SUSP
80.0000 mg | Freq: Once | INTRAMUSCULAR | Status: AC
  Administered 2017-03-02: 80 mg via INTRAMUSCULAR

## 2017-03-02 MED ORDER — LEVOFLOXACIN 500 MG PO TABS: 500.0000 mg | ORAL_TABLET | Freq: Every day | ORAL | 0 refills | Status: DC

## 2017-03-02 NOTE — Patient Instructions (Signed)
Levaquin 500 milligrams daily for 10 days. Hycodan if needed for cough. Depo-Medrol 80 mg IM.

## 2017-03-02 NOTE — Progress Notes (Signed)
   Subjective:    Patient ID: Ricky Sharp, male    DOB: 09-08-1961, 56 y.o.   MRN: [id number]  HPI  56 year old Male with acute respiratory infection symptoms onset March 19 after daughter came home from college with a respiratory infection over the weekend of March 17 and 18th. He has a history of asthma and allergic rhinitis. He's had nasal congestion. No fever or shaking chills. Some discolored nasal drainage. Has Hycodan on hand for cough if needed.    Review of Systems see above     Objective:   Physical Exam Skin warm and dry. Nodes none. Pharynx very slightly injected. TMs are clear. Neck is supple. Chest clear to auscultation. Sounds nasally congested when he speaks.       Assessment & Plan:  Acute maxillary sinusitis  Plan: Depo-Medrol 80 mg IM. Hycodan every 8 hours if needed for cough. Levaquin 500 milligrams daily for 10 days.

## 2017-03-20 ENCOUNTER — Encounter: Payer: Self-pay | Admitting: Neurology

## 2017-03-20 ENCOUNTER — Encounter: Payer: Self-pay | Admitting: Allergy and Immunology

## 2017-03-20 ENCOUNTER — Ambulatory Visit (INDEPENDENT_AMBULATORY_CARE_PROVIDER_SITE_OTHER): Payer: Commercial Managed Care - PPO | Admitting: Allergy and Immunology

## 2017-03-20 VITALS — BP 134/78 | HR 72 | Resp 16 | Ht 71.0 in | Wt 215.0 lb

## 2017-03-20 DIAGNOSIS — J3089 Other allergic rhinitis: Secondary | ICD-10-CM | POA: Diagnosis not present

## 2017-03-20 DIAGNOSIS — J302 Other seasonal allergic rhinitis: Secondary | ICD-10-CM | POA: Diagnosis not present

## 2017-03-20 DIAGNOSIS — J4520 Mild intermittent asthma, uncomplicated: Secondary | ICD-10-CM

## 2017-03-20 MED ORDER — AZELASTINE-FLUTICASONE 137-50 MCG/ACT NA SUSP: NASAL | 5 refills | Status: DC

## 2017-03-20 NOTE — Patient Instructions (Signed)
Seasonal and perennial allergic rhinitis  Given length the time that he has been on aeroallergen immunotherapy, we will hold injections at this time.  If his symptoms remain stable over the next few months, this therapy will be discontinued.  If his nasal/sinus symptoms progress we will retest and restart immunotherapy.  A refill prescription has been provided for Dymista, one spray per nostril twice daily as needed.  Nasal saline spray (i.e., Simply Saline) or nasal saline lavage (i.e., NeilMed) as needed and prior to medicated nasal sprays.   Return in about 4 months (around 07/20/2017), or if symptoms worsen or fail to improve.

## 2017-03-20 NOTE — Progress Notes (Signed)
New Patient Note  RE: Ricky Sharp MRN: [id number] DOB: 09-Jun-1961 Date of Office Visit: 03/20/2017  Referring provider: Elby Showers, MD Primary care provider: Elby Showers, MD  Chief Complaint: Allergic Rhinitis    History of present illness: Ricky Sharp is a 56 y.o. male seen today in consultation requested by Tedra Senegal, MD. He was followed for allergic rhinitis and intermittent asthma by Dr. Baird Lyons.  He states that he has been on aeroallergen immunotherapy for approximately 20 years.  He still experiences mild nasal congestion, however states that the number/frequency of sinus infections has been reduced significantly.  He has a history of mild intermittent asthma, however has not experienced lower respiratory symptoms over the past year or 2.  He states that he has not carried albuterol rescue for quite some time based upon Dr. Janee Morn recommendation.   Assessment and plan: Seasonal and perennial allergic rhinitis  Given length the time that he has been on aeroallergen immunotherapy, we will hold injections at this time.  If his symptoms remain stable over the next few months, this therapy will be discontinued.  If his nasal/sinus symptoms progress we will retest and restart immunotherapy.  A refill prescription has been provided for Dymista, one spray per nostril twice daily as needed.  Nasal saline spray (i.e., Simply Saline) or nasal saline lavage (i.e., NeilMed) as needed and prior to medicated nasal sprays.   Meds ordered this encounter  Medications  . Azelastine-Fluticasone (DYMISTA) 137-50 MCG/ACT SUSP    Sig: USE 1-2 SPRAYS IN EACH NOSTRIL AT BEDTIME AS DIRECTED    Dispense:  23 g    Refill:  5    Diagnostics: None performed.    Physical examination: Blood pressure 134/78, pulse 72, resp. rate 16, height 5\' 11"  (1.803 m), weight 215 lb (97.5 kg), SpO2 98 %.  General: Alert, interactive, in no acute distress. HEENT: TMs pearly gray,  turbinates mildly edematous without discharge, post-pharynx mildly erythematous. Neck: Supple without lymphadenopathy. Lungs: Clear to auscultation without wheezing, rhonchi or rales. CV: Normal S1, S2 without murmurs. Abdomen: Nondistended, nontender. Skin: Warm and dry, without lesions or rashes. Extremities:  No clubbing, cyanosis or edema. Neuro:   Grossly intact.  Review of systems:  Review of systems negative except as noted in HPI / PMHx or noted below: Review of Systems  Constitutional: Negative.   HENT: Negative.   Eyes: Negative.   Respiratory: Negative.   Cardiovascular: Negative.   Gastrointestinal: Negative.   Genitourinary: Negative.   Musculoskeletal: Negative.   Skin: Negative.   Neurological: Negative.   Endo/Heme/Allergies: Negative.   Psychiatric/Behavioral: Negative.     Past medical history:  Past Medical History:  Diagnosis Date  . Eczema   . Recurrent upper respiratory infection (URI)     Past surgical history:  Past Surgical History:  Procedure Laterality Date  . CHOLECYSTECTOMY    . OSTEOCHONDROMA EXCISION    . TONSILLECTOMY    . WISDOM TOOTH EXTRACTION      Family history: Family History  Problem Relation Age of Onset  . Adopted: Yes  . Fibromyalgia Mother   . Syncope episode Mother   . Lung disease Mother     Social history: Social History   Social History  . Marital status: Married    Spouse name: N/A  . Number of children: N/A  . Years of education: N/A   Occupational History  . network admin    Social History Main Topics  . Smoking status: Never  Smoker  . Smokeless tobacco: Never Used  . Alcohol use 1.2 oz/week    2 Glasses of wine per week  . Drug use: No  . Sexual activity: Not on file   Other Topics Concern  . Not on file   Social History Narrative  . No narrative on file   Environmental History: The patient lives in a 57 year old house with hardwood floors throughout, gas heat, and central air.  He is a  nonsmoker without pets.  There is no known mold/water damage in the home.  Allergies as of 03/20/2017      Reactions   Lisinopril    diarrhea   Losartan    Fatigue, diarrhea      Medication List       Accurate as of 03/20/17  6:55 PM. Always use your most recent med list.          ALPRAZolam 0.25 MG tablet Commonly known as:  XANAX Take 1 tablet (0.25 mg total) by mouth 2 (two) times daily as needed for anxiety.   Azelastine-Fluticasone 137-50 MCG/ACT Susp Commonly known as:  DYMISTA USE 1-2 SPRAYS IN EACH NOSTRIL AT BEDTIME AS DIRECTED   CARAFATE 1 GM/10ML suspension Generic drug:  sucralfate Take by mouth as directed.   CENTRUM SILVER tablet Take 1 tablet by mouth daily.   cromolyn 5.2 MG/ACT nasal spray Commonly known as:  NASALCROM Place 1 spray into the nose 4 (four) times daily.   DEXILANT 60 MG capsule Generic drug:  dexlansoprazole Take 60 mg by mouth daily as needed.   diltiazem 180 MG 24 hr capsule Commonly known as:  DILACOR XR Take 1 capsule (180 mg total) by mouth daily.   escitalopram 10 MG tablet Commonly known as:  LEXAPRO TAKE (1/2) TABLET DAILY.   hydrochlorothiazide 25 MG tablet Commonly known as:  HYDRODIURIL Take 1 tablet (25 mg total) by mouth daily.   ibuprofen 200 MG tablet Commonly known as:  ADVIL,MOTRIN Take 200 mg by mouth every 6 (six) hours as needed.   levothyroxine 88 MCG tablet Commonly known as:  SYNTHROID, LEVOTHROID TAKE 1 TABLET ONCE DAILY.   NONFORMULARY OR COMPOUNDED ITEM Allergy Vaccine 1:10 Given at Home   olopatadine 0.1 % ophthalmic solution Commonly known as:  PATANOL Place 1 drop into both eyes 2 (two) times daily. As needed   ORACEA 40 MG capsule Generic drug:  doxycycline   potassium chloride 10 MEQ tablet Commonly known as:  K-DUR TAKE (2) TABLETS TWICE DAILY.       Known medication allergies: Allergies  Allergen Reactions  . Lisinopril     diarrhea  . Losartan     Fatigue, diarrhea    I  appreciate the opportunity to take part in [name]'s care. Please do not hesitate to contact me with questions.  Sincerely,   R. Edgar Frisk, MD

## 2017-03-20 NOTE — Assessment & Plan Note (Signed)
   Given length the time that he has been on aeroallergen immunotherapy, we will hold injections at this time.  If his symptoms remain stable over the next few months, this therapy will be discontinued.  If his nasal/sinus symptoms progress we will retest and restart immunotherapy.  A refill prescription has been provided for Dymista, one spray per nostril twice daily as needed.  Nasal saline spray (i.e., Simply Saline) or nasal saline lavage (i.e., NeilMed) as needed and prior to medicated nasal sprays.

## 2017-04-18 ENCOUNTER — Other Ambulatory Visit: Payer: Self-pay | Admitting: Cardiovascular Disease

## 2017-05-18 ENCOUNTER — Ambulatory Visit (INDEPENDENT_AMBULATORY_CARE_PROVIDER_SITE_OTHER): Payer: Commercial Managed Care - PPO | Admitting: Cardiovascular Disease

## 2017-05-18 ENCOUNTER — Encounter: Payer: Self-pay | Admitting: Cardiovascular Disease

## 2017-05-18 VITALS — BP 140/82 | HR 69 | Ht 70.5 in | Wt 214.4 lb

## 2017-05-18 DIAGNOSIS — I10 Essential (primary) hypertension: Secondary | ICD-10-CM

## 2017-05-18 MED ORDER — LOSARTAN POTASSIUM 50 MG PO TABS: 50.0000 mg | ORAL_TABLET | Freq: Every day | ORAL | 3 refills | Status: DC

## 2017-05-18 MED ORDER — DILTIAZEM HCL ER 240 MG PO CP24: 240.0000 mg | ORAL_CAPSULE | Freq: Every day | ORAL | 3 refills | Status: DC

## 2017-05-18 NOTE — Progress Notes (Signed)
Cardiology Office Note   Date:  05/18/2017   ID:  Ricky Sharp, DOB 1961-03-07, MRN [id number]  PCP:  Elby Showers, MD  Cardiologist:   Mertie Moores, MD   Chief Complaint  Patient presents with  . Hypertension      History of Present Illness: NYQUAN SELBE is a 56 y.o. male who presents for HTN  1. Hypertension 2. Anxiety - stress related 3. GERD  History of Present Illness:  Patient is a 56 year old gentleman with a history of hypertension. He's tried several medications in the past that have not completely controlled his blood pressure.  - He has tried Losartan but had some dizziness / dry mouth.  - tried bystolic 5 mg - felt poorly, persistant head ache, diarrhea, mild respiratory distress. Tried for a week  He has also tried some antianxiety meds but had various reactions.  He has multiple family and life stresses.   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 He is also having some family health issues that require him to spend lots of time with them.  He has felt well on his HCTZ and potassium since his last visit. He has not been exercising as much as he would like.  Oct. 1, 2014:  Ricky Sharp is a 56 yo with hx of HTN. He has not been exercising. He has gained 10 lbs. He has tried Losartan but it caused vertigo. He tried beta blockers but had severe fatigue and slow HR. He does not want to start any new meds. He knows that his weight is up and that is BP has been elevated.   March 04, 2014:  Ricky Sharp has been diagnosed with hypothyroidism since I last saw him. He is feeling somewhat better.  Diastolic BP has still been a bit high. Still eating some salt. BP at home is typically is the 80s - occasionally upper 80s.   Oct. 1, 2015:  Ricky Sharp is now off the Losartan - was having lots of fatigue and diarrhea. Symptoms are better. On HCTZ and blood  pressures are well controlled. Rides his bike on occasion. He also is taking Judo . Not Having any episodes of chest pain BP has been ok  Jan. 21, 2016:  Ricky Sharp has had HTN that has been difficult to manage.  He developed GI complaints with Losartan and with Lisinopril.  He had significant diarrhea on the Lisinopril - has resolved.   April 03, 2015:  Ricky Sharp has been seen in the hypertension clinic for the past several months. His blood pressure is now well controlled. No CP , no dyspnea. Still has anxiety.   April 04, 2016: Ricky Sharp is doing well  Weight is up a little  Mother died , lots of stress.  Amlodipine still causes diarrhea - ( like clockwork)   May 18, 2017  BP has been well controled at home.   Still eating a little more salt than he should. Not as active.   Weight is staying elevated.  Mother died 2 years ago and he had a struggle working through the estate.  No exercising    Wt Readings from Last 3 Encounters:  05/18/17 214 lb 6 oz (97.2 kg)  03/20/17 215 lb (97.5 kg)  03/02/17 219 lb (99.3 kg)     Past Medical History:  Diagnosis Date  . Eczema   . Recurrent upper respiratory infection (URI)     Past Surgical History:  Procedure Laterality Date  . CHOLECYSTECTOMY    . OSTEOCHONDROMA EXCISION    . TONSILLECTOMY    . WISDOM TOOTH EXTRACTION       Current Outpatient Prescriptions  Medication Sig Dispense Refill  . ALPRAZolam (XANAX) 0.25 MG tablet Take 1 tablet (0.25 mg total) by mouth 2 (two) times daily as needed for anxiety. 60 tablet 0  . Azelastine-Fluticasone (DYMISTA) 137-50 MCG/ACT SUSP USE 1-2 SPRAYS IN EACH NOSTRIL AT BEDTIME AS DIRECTED 23 g 5  . CARAFATE 1 GM/10ML suspension Take by mouth as directed.     . cromolyn (NASALCROM) 5.2 MG/ACT nasal spray Place 1 spray into the nose 4 (four) times daily.      Marland Kitchen dexlansoprazole (DEXILANT) 60 MG capsule Take 60 mg by mouth daily as needed.     Marland Kitchen escitalopram (LEXAPRO) 10 MG tablet TAKE (1/2)  TABLET DAILY. 17 tablet 11  . hydrochlorothiazide (HYDRODIURIL) 25 MG tablet Take 1 tablet (25 mg total) by mouth daily. 90 tablet 0  . ibuprofen (ADVIL,MOTRIN) 200 MG tablet Take 200 mg by mouth every 6 (six) hours as needed.      Marland Kitchen levothyroxine (SYNTHROID, LEVOTHROID) 88 MCG tablet TAKE 1 TABLET ONCE DAILY. 31 tablet 11  . Multiple Vitamins-Minerals (CENTRUM SILVER) tablet Take 1 tablet by mouth daily.    . NONFORMULARY OR COMPOUNDED ITEM Allergy Vaccine 1:10 Given at Home    . olopatadine (PATANOL) 0.1 % ophthalmic solution Place 1 drop into both eyes 2 (two) times daily. As needed 5 mL prn  . ORACEA 40 MG capsule     . potassium chloride (K-DUR) 10 MEQ tablet TAKE (2) TABLETS TWICE DAILY. 120 tablet 2  . diltiazem (DILT-XR) 240 MG 24 hr capsule Take 1 capsule (240 mg total) by mouth daily. 90 capsule 3   No current facility-administered medications for this visit.     Allergies:   Lisinopril and Losartan    Social History:  The patient  reports that he has never smoked. He has never used smokeless tobacco. He reports that he drinks about 1.2 oz of alcohol per week . He reports that he does not use drugs.   Family History:  The patient's family history includes Fibromyalgia in his mother; Lung disease in his mother; Syncope episode in his mother. He was adopted.    ROS:  Please see the history of present illness.   Otherwise, review of systems are positive for none.   All other systems are reviewed and negative.    PHYSICAL EXAM: VS:  BP 140/82   Pulse 69   Ht 5' 10.5" (1.791 m)   Wt 214 lb 6 oz (97.2 kg)   SpO2 98%   BMI 30.32 kg/m  , BMI Body mass index is 30.32 kg/m. GEN: Well nourished, well developed, in no acute distress  HEENT: normal  Neck: no JVD, carotid bruits, or masses Cardiac: RRR; no murmurs, rubs, or gallops,no edema  Respiratory:  clear to auscultation bilaterally, normal work of breathing GI: soft, nontender, nondistended, + BS MS: no deformity or  atrophy  Skin: warm and dry, no rash Neuro:  Strength and sensation are intact Psych: euthymic mood, full affect   EKG:  EKG is ordered today. NSR at 69.  Inc. RBBB     Recent Labs: 12/13/2016: ALT 49; BUN 16; Creat 0.97; Hemoglobin 16.8; Platelets 255; Potassium 4.3; Sodium 139; TSH 2.50    Lipid Panel    Component Value Date/Time   CHOL 193 12/13/2016 1135  TRIG 306 (H) 12/13/2016 1135   HDL 28 (L) 12/13/2016 1135   CHOLHDL 6.9 (H) 12/13/2016 1135   VLDL 61 (H) 12/13/2016 1135   LDLCALC 104 (H) 12/13/2016 1135      Wt Readings from Last 3 Encounters:  05/18/17 214 lb 6 oz (97.2 kg)  03/20/17 215 lb (97.5 kg)  03/02/17 219 lb (99.3 kg)      Other studies Reviewed: Additional studies/ records that were reviewed today include: . Review of the above records demonstrates:    ASSESSMENT AND PLAN:  1.  Essential  Hypertension:  Ricky Sharp presents again with hypertension. He has tried by [employer] , losartan, lisinopril, Amlodipine  but did not tolerate any of these medications.   Seems to tolerate the Diltiazem  Will increase Diltiazem to 240 mg a day . Marland Kitchen  Encouraged him to work harder on a diet weight loss.   Current medicines are reviewed at length with the patient today.  The patient does not have concerns regarding medicines.  The following changes have been made:      Labs/ tests ordered today include:   Orders Placed This Encounter  Procedures  . EKG 12-Lead    Disposition:   FU with me  in 6 months   Signed, Mertie Moores, MD  05/18/2017 11:00 AM    [facility] [place], [place], [place]  [address] Phone: [phone]; Fax: [phone]

## 2017-05-18 NOTE — Patient Instructions (Addendum)
Medication Instructions:  1) INCREASE Diltiazem to 240mg  once daily.  Labwork: None   Testing/Procedures: None  Follow-Up: Your physician wants you to follow-up in: 6 months with Dr. Acie Fredrickson.  You will receive a reminder letter in the mail two months in advance. If you don't receive a letter, please call our office to schedule the follow-up appointment.   Any Other Special Instructions Will Be Listed Below (If Applicable).     If you need a refill on your cardiac medications before your next appointment, please call your pharmacy.

## 2017-05-25 ENCOUNTER — Ambulatory Visit: Payer: Commercial Managed Care - PPO | Admitting: Neurology

## 2017-07-06 ENCOUNTER — Encounter: Payer: Self-pay | Admitting: Neurology

## 2017-07-13 ENCOUNTER — Ambulatory Visit: Payer: Commercial Managed Care - PPO | Admitting: Neurology

## 2017-08-21 ENCOUNTER — Other Ambulatory Visit: Payer: Commercial Managed Care - PPO | Admitting: Internal Medicine

## 2017-08-22 ENCOUNTER — Other Ambulatory Visit: Payer: Commercial Managed Care - PPO | Admitting: Internal Medicine

## 2017-08-22 DIAGNOSIS — E039 Hypothyroidism, unspecified: Secondary | ICD-10-CM

## 2017-08-22 DIAGNOSIS — E781 Pure hyperglyceridemia: Secondary | ICD-10-CM

## 2017-08-22 LAB — LIPID PANEL
CHOLESTEROL: 193 mg/dL (ref <200)
HDL: 28 mg/dL — AB (ref >40)
LDL Cholesterol (Calc): 123 mg/dL (calc) — ABNORMAL HIGH
NON-HDL CHOLESTEROL (CALC): 165 mg/dL — AB (ref <130)
Total CHOL/HDL Ratio: 6.9 (calc) — ABNORMAL HIGH (ref <5.0)
Triglycerides: 272 mg/dL — ABNORMAL HIGH (ref <150)

## 2017-08-22 LAB — TSH: TSH: 2.55 m[IU]/L (ref 0.40–4.50)

## 2017-08-24 ENCOUNTER — Ambulatory Visit (INDEPENDENT_AMBULATORY_CARE_PROVIDER_SITE_OTHER): Payer: Commercial Managed Care - PPO | Admitting: Internal Medicine

## 2017-08-24 VITALS — BP 158/82 | HR 85 | Temp 98.2°F | Wt 213.0 lb

## 2017-08-24 DIAGNOSIS — G4733 Obstructive sleep apnea (adult) (pediatric): Secondary | ICD-10-CM | POA: Diagnosis not present

## 2017-08-24 DIAGNOSIS — E039 Hypothyroidism, unspecified: Secondary | ICD-10-CM | POA: Diagnosis not present

## 2017-08-24 DIAGNOSIS — K219 Gastro-esophageal reflux disease without esophagitis: Secondary | ICD-10-CM | POA: Diagnosis not present

## 2017-08-24 DIAGNOSIS — F411 Generalized anxiety disorder: Secondary | ICD-10-CM

## 2017-08-24 DIAGNOSIS — F439 Reaction to severe stress, unspecified: Secondary | ICD-10-CM

## 2017-08-24 DIAGNOSIS — E781 Pure hyperglyceridemia: Secondary | ICD-10-CM | POA: Diagnosis not present

## 2017-08-24 DIAGNOSIS — I10 Essential (primary) hypertension: Secondary | ICD-10-CM

## 2017-08-24 DIAGNOSIS — Z8709 Personal history of other diseases of the respiratory system: Secondary | ICD-10-CM

## 2017-08-24 MED ORDER — ROSUVASTATIN CALCIUM 5 MG PO TABS: 5.0000 mg | ORAL_TABLET | Freq: Every day | ORAL | 1 refills | Status: DC

## 2017-08-24 MED ORDER — ALPRAZOLAM 0.25 MG PO TABS: 0.2500 mg | ORAL_TABLET | Freq: Two times a day (BID) | ORAL | 3 refills | Status: DC | PRN

## 2017-09-07 ENCOUNTER — Ambulatory Visit (INDEPENDENT_AMBULATORY_CARE_PROVIDER_SITE_OTHER): Payer: Commercial Managed Care - PPO | Admitting: Internal Medicine

## 2017-09-07 ENCOUNTER — Encounter: Payer: Self-pay | Admitting: Internal Medicine

## 2017-09-07 VITALS — BP 140/82 | HR 71 | Temp 97.4°F | Wt 214.0 lb

## 2017-09-07 DIAGNOSIS — I10 Essential (primary) hypertension: Secondary | ICD-10-CM | POA: Diagnosis not present

## 2017-09-07 DIAGNOSIS — Z23 Encounter for immunization: Secondary | ICD-10-CM | POA: Diagnosis not present

## 2017-09-07 DIAGNOSIS — E781 Pure hyperglyceridemia: Secondary | ICD-10-CM | POA: Diagnosis not present

## 2017-09-07 NOTE — Progress Notes (Signed)
   Subjective:    Patient ID: Ricky Sharp, male    DOB: Apr 14, 1961, 56 y.o.   MRN: [id number]  HPI 56 year old White Male in today for six-month recheck. History of hypothyroidism, hyperlipidemia, hypertension. Sees Dr. Acie Fredrickson for hypertension. Intolerant of a number of antihypertensive medications. Currently on Diltiazem 240 mg daily, HCTZ 25 mg daily, levothyroxine, Lexapro, Dexilant, amlodipine, Xanax twice daily as needed. He is on statin medication.  History of asthma and GE reflux.  Situational stress at work. He may be facing a layoff there. History of sleep apnea and snoring as well as fatigue. History of excessive daytime sleepiness.  He was tried on losartan but did not tolerate it.  Review of Systems     Objective:   Physical Exam  Neck supple. Chest clear. No thyromegaly. Cardiac exam regular rate and rhythm. Extremities without edema. Spent 20 minutes speaking with him about these issues. His blood pressure is elevated at 158/82. This could be situational. He will return late September for follow-up.      Assessment & Plan:  Elevated blood pressure reading  Essential hypertension  Anxiety  Hypothyroidism-TSH stable on thyroid replacement  Plan: Return late September for follow-up of elevated blood pressure. No change in medication at present time.

## 2017-09-07 NOTE — Patient Instructions (Addendum)
Continue same medications and return for lipid panel and liver functions in early December. He will be seeing Dr. Acie Fredrickson for follow-up of hypertension in December as well. Try to get exercise and watch diet. Flu vaccine given.

## 2017-09-07 NOTE — Progress Notes (Signed)
   Subjective:    Patient ID: Ricky Sharp, male    DOB: October 04, 1961, 56 y.o.   MRN: [id number]  HPI Situational stress with his job. 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 He is just beginning to learn what this merger mains for his job situation. His blood pressure unfortunately remains elevated at 140/82. His BMI is 30.27. Not exercising as much as he would like to. He used to ride his bike quite a bit.    Review of Systems see above     Objective:   Physical Exam Neck supple. Chest clear. Cardiac exam regular rate and rhythm. Blood pressure rechecked and was 150/100.       Assessment & Plan:  Essential hypertension-she is intolerant of several different medications including Bystolic, losartan, lisinopril, amlodipine. He is currently on diltiazem 240 mg daily and HCTZ.  He recently was started on Crestor and needs follow-up in 3 months. This can be done in early December hopefully before he sees Dr. Acie Fredrickson. We talked about adding additional antihypertensive medication but he is a bit reluctant to do so at this point in time given his situational stress. We will defer until he sees Dr. Acie Fredrickson  in December.  Flu vaccine given.

## 2017-09-09 ENCOUNTER — Encounter: Payer: Self-pay | Admitting: Internal Medicine

## 2017-09-09 NOTE — Patient Instructions (Signed)
Return in late September for follow-up on elevated blood pressure. Continue same medications.

## 2017-09-13 ENCOUNTER — Other Ambulatory Visit: Payer: Self-pay | Admitting: Cardiovascular Disease

## 2017-09-13 ENCOUNTER — Other Ambulatory Visit: Payer: Self-pay | Admitting: Internal Medicine

## 2017-11-08 ENCOUNTER — Encounter: Payer: Self-pay | Admitting: Neurology

## 2017-11-10 ENCOUNTER — Other Ambulatory Visit (INDEPENDENT_AMBULATORY_CARE_PROVIDER_SITE_OTHER): Payer: Commercial Managed Care - PPO | Admitting: Internal Medicine

## 2017-11-10 DIAGNOSIS — E781 Pure hyperglyceridemia: Secondary | ICD-10-CM | POA: Diagnosis not present

## 2017-11-10 LAB — HEPATIC FUNCTION PANEL
AG RATIO: 1.9 (calc) (ref 1.0–2.5)
ALBUMIN MSPROF: 4.5 g/dL (ref 3.6–5.1)
ALKALINE PHOSPHATASE (APISO): 60 U/L (ref 40–115)
ALT: 43 U/L (ref 9–46)
AST: 25 U/L (ref 10–35)
BILIRUBIN DIRECT: 0.2 mg/dL (ref 0.0–0.2)
BILIRUBIN TOTAL: 0.8 mg/dL (ref 0.2–1.2)
Globulin: 2.4 g/dL (calc) (ref 1.9–3.7)
Indirect Bilirubin: 0.6 mg/dL (calc) (ref 0.2–1.2)
Total Protein: 6.9 g/dL (ref 6.1–8.1)

## 2017-11-10 LAB — LIPID PANEL
CHOL/HDL RATIO: 4.4 (calc) (ref <5.0)
CHOLESTEROL: 129 mg/dL (ref <200)
HDL: 29 mg/dL — AB (ref >40)
LDL CHOLESTEROL (CALC): 70 mg/dL
Non-HDL Cholesterol (Calc): 100 mg/dL (calc) (ref <130)
Triglycerides: 207 mg/dL — ABNORMAL HIGH (ref <150)

## 2017-11-13 ENCOUNTER — Encounter: Payer: Self-pay | Admitting: Neurology

## 2017-11-13 ENCOUNTER — Ambulatory Visit: Payer: Commercial Managed Care - PPO | Admitting: Neurology

## 2017-11-13 VITALS — BP 157/5 | HR 77 | Ht 70.0 in | Wt 213.0 lb

## 2017-11-13 DIAGNOSIS — G4713 Recurrent hypersomnia: Secondary | ICD-10-CM | POA: Insufficient documentation

## 2017-11-13 DIAGNOSIS — Z9989 Dependence on other enabling machines and devices: Secondary | ICD-10-CM

## 2017-11-13 DIAGNOSIS — G4733 Obstructive sleep apnea (adult) (pediatric): Secondary | ICD-10-CM

## 2017-11-13 MED ORDER — MODAFINIL 200 MG PO TABS: 200.0000 mg | ORAL_TABLET | Freq: Every day | ORAL | 5 refills | Status: DC

## 2017-11-13 NOTE — Progress Notes (Signed)
SLEEP MEDICINE CLINIC   Provider:  Larey Seat, M D  Referring Provider: Elby Showers, MD Primary Care Physician:  Elby Showers, MD   Chief complaint according to patient : "I am fatigued and sleepy "   HPI:  Ricky Sharp is a 56 y.o. male , seen here as a referral from Dr. Renold Genta for a sleep evaluation, Ricky Sharp reports that he has been a light sleeper most of his life. 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 Both parents in law have died in 03-19-13 and then in March 19, 2014 and just this may his mother died. He has 2 kids of college age. He feels that he has been chronically fatigued unrelenting the fatigue to a disabling degree. This is partially reflected in today's questionnaires. He endorsed the fatigue severity score at 53 points and the Epworth sleepiness score at 9 points. Part of what made him seek a sleep specialist opinion was that his wife was evaluated and diagnosed with obstructive sleep apnea and is now on CPAP. In the process of  an internal medicine workup for fatigue Dr. Renold Genta also ordered multiple metabolic panels. The patient learned that he had a thyroid dysfunction.hypo-thyroidism.  He regularly takes a multivitamin but he was not asked to shore up on vitamin D or B12 supplements. He was also not told if he is anemic or not. I reviewed the lab results from December, February and again from last August and his TSH  level has significantly come down. No anemia .He has frequent nasal allergies.   1) the severe fatigue that the patient reports could still be an aftermath of the thyroid disease. However I am strongly suspicious that the patient will have obstructive sleep apnea or at least upper airway resistance he. He has a very narrow upper airway with a Mallampati grade 4, he does have a crowded lower jaw that is slightly  hypoplastic, creating retrognathia. His spouse has already told him that he snores but she was not explicit about having witnessed apneas.The patient has already used a pulse oximetry which noted no significant desaturations. This was not a medically ordered procedure but a home test device.  2) I like for the patient also to take extra vitamin B12 or vitamin B complex and vitamin D. The supplements can help to overcome fatigue. I will not order specific blood tests for deficiencies as these will most likely be have to be covered by the patient himself.  3) I will order a attended polysomnography as a split-night study. I explained that he will sleep for about 2 hours to make the diagnosis the rest of the night can be used to treat the apnea. If his insurance carrier will not permit and attended sleep study and insists on a home sleep test he may have to return later for a titration. He does not report morning headaches and he has no underlying cardiac or lung disease so  capnography will not be necessary.  Sleep habits are as follows: The patient usually goes to bed between 10 and 11 and rises at 5 in the morning. He has always been a light sleeper as mentioned before and usually is awake before the alarm rings. A good night would be 6 hours of sleep most nights he will accumulate about 4-1/2-5 hours of sleep. This has been going  on for over a decade. He sleeps on his side, bedroom is cool and quiet and dark. His wife likes to watch TV in the bedroom.  Typically he would not have considered himself and nap time. However over the increasing fatigue over the last 12-24 month especially, he suffers from the irresistible urge to go to sleep. Sleep medical history and family sleep history:  Not known.  Social history: married, working regular office hours, non smoker, socially  ETOH drinker , caffeine 2 espresso daily.   Interval history from 10-07-15. Ricky Sharp is seen here today after his recent sleep  study dated 09-24-15. His polysomnography revealed mild apnea 15 obstructive apneas were seen and 43 shallow breathing spells the AHI was 10.6 the RDI 10.6 the REM AHI was 30.6. Nadir was 87% oxygenation but for only 6 meet minutes. Based on the outcome of the sleep study I feel that CPAP could be helpful in treating all observed sleep disorders mild apnea, snoring, upper airway resistance he syndrome. But the patient also has retrognathia and may actually improved to a significant degree by using a dental device such as a mandibular advancement device. He has advised me of a TMJ click at the right jaw but he is not in pain. It may be possible to treat bruxism and snoring with this device and not aggravate the TM joint. Based on his degree of apnea can go either way,  CPAP or dental device. A dental device would be manufactured to the specification of the sleep lab in the treatment of a medical condition and therefore would run through his general health insurance and not through a dental insurance. Will order auto CPAP. 5-12 cm water. Mask of patients choice.   History from 11-26-15. Ricky Sharp has numeric done very well with the CPAP. He is an 83% compliance for over 4 hours of daily use the average user time is 6 hours 42 minutes. He is using an AutoSet which allows pressure changes between 5 and 12 cm. EPR is set at 3 cm. The 91st percentile of pressure used at night came out at 7.5 cm water.  His AHI is 1.3 which is an excellent resolution of apnea which was at baseline only 10.6 but during REM sleep 30.6.  He has no significant air leaks and the pressure seems to be stable. He endorses today the Epworth sleepiness score at 9 but the fatigue severity score  remains very high at 55 points. He feels at best a marginal improvement but he would engage at about 10%. It is not insomnia at night so the apnea is numerically treated but he remains excessively fatigued not so much sleepy. The question is if we  can place him on a medication for fatigue. Aside from his sleep apnea there is no other sleep disorder that needs to be addressed. He has had a recent appointment with his primary care physician Dr. Dawna Part, and he and his primary care physician R looking further for reasons of significant fatigue. As it stands now the apnea is not the cause for the continued fatigue but it would be FDA approved to write modafinil for him to treat fatigue.  Interval history from 05/25/2016, Ricky Sharp presents with a 80% compliance for the last 30 days his average user time for CPAP on days used is 7 hours and 3 minutes and over all days 5 hours 40 minutes. His AHI is 1.2 and he has continued to use AutoSet, between 5 and  12 cm water with 3 cm EPR there is no major air leak noted at the apnea is very well controlled. His fatigue is a little decreased in comparison to a year ago 41 points on the fatigue severity score and 11 points on the Epworth sleepiness score.  Interval history 11-13-2017, I have the pleasure of seeing Ricky Sharp today who has been highly compliant CPAP patient, 80% compliance with an average time of 5 hours and 54 minutes of CPAP use, he is placed on an AutoSet between 5 and 12 cmH2O with 3 cm expiratory pressure relief.  His residual AHI is only 1.4/h, which is an excellent resolution.  Higher pressures are not indicated as central apneas emerged.  At the 95th percentile pressure is sleep 7.6 cm water. He has some skin reaction to the interface.   Review of Systems: Out of a complete 14 system review, the patient complains of only the following symptoms, and all other reviewed systems are negative.  Mild OSA,  Epworth score 8 from 11  , Fatigue severity score still 41 from 55 ,  depression score n/a, anxiety - has side effects to all SSRI until low dose lexapro was used.    Social History   Socioeconomic History  . Marital status: Married    Spouse name: Not on file  . Number of  children: Not on file  . Years of education: Not on file  . Highest education level: Not on file  Social Needs  . Financial resource strain: Not on file  . Food insecurity - worry: Not on file  . Food insecurity - inability: Not on file  . Transportation needs - medical: Not on file  . Transportation needs - non-medical: Not on file  Occupational History  . Occupation: Teacher, music  Tobacco Use  . Smoking status: Never Smoker  . Smokeless tobacco: Never Used  Substance and Sexual Activity  . Alcohol use: Yes    Alcohol/week: 1.2 oz    Types: 2 Glasses of wine per week  . Drug use: No  . Sexual activity: Not on file  Other Topics Concern  . Not on file  Social History Narrative  . Not on file    Family History  Adopted: Yes  Problem Relation Age of Onset  . Fibromyalgia Mother   . Syncope episode Mother   . Lung disease Mother     Past Medical History:  Diagnosis Date  . Eczema   . Recurrent upper respiratory infection (URI)     Past Surgical History:  Procedure Laterality Date  . CHOLECYSTECTOMY    . OSTEOCHONDROMA EXCISION    . TONSILLECTOMY    . WISDOM TOOTH EXTRACTION      Current Outpatient Medications  Medication Sig Dispense Refill  . ALPRAZolam (XANAX) 0.25 MG tablet Take 1 tablet (0.25 mg total) by mouth 2 (two) times daily as needed for anxiety. 60 tablet 3  . Azelastine-Fluticasone (DYMISTA) 137-50 MCG/ACT SUSP USE 1-2 SPRAYS IN EACH NOSTRIL AT BEDTIME AS DIRECTED 23 g 5  . CARAFATE 1 GM/10ML suspension Take by mouth as directed.     . cromolyn (NASALCROM) 5.2 MG/ACT nasal spray Place 1 spray into the nose 4 (four) times daily.      Marland Kitchen dexlansoprazole (DEXILANT) 60 MG capsule Take 60 mg by mouth daily as needed.     . diltiazem (DILT-XR) 240 MG 24 hr capsule Take 1 capsule (240 mg total) by mouth daily. 90 capsule 3  . escitalopram (LEXAPRO) 10  MG tablet TAKE (1/2) TABLET DAILY. 45 tablet 3  . hydrochlorothiazide (HYDRODIURIL) 25 MG tablet TAKE 1  TABLET DAILY. 90 tablet 2  . ibuprofen (ADVIL,MOTRIN) 200 MG tablet Take 200 mg by mouth every 6 (six) hours as needed.      Marland Kitchen levothyroxine (SYNTHROID, LEVOTHROID) 88 MCG tablet TAKE 1 TABLET ONCE DAILY. 31 tablet 11  . Multiple Vitamins-Minerals (CENTRUM SILVER) tablet Take 1 tablet by mouth daily.    Marland Kitchen olopatadine (PATANOL) 0.1 % ophthalmic solution Place 1 drop into both eyes 2 (two) times daily. As needed (Patient not taking: Reported on 09/07/2017) 5 mL prn  . ORACEA 40 MG capsule     . potassium chloride (K-DUR) 10 MEQ tablet TAKE (2) TABLETS TWICE DAILY. 360 tablet 2  . rosuvastatin (CRESTOR) 5 MG tablet Take 1 tablet (5 mg total) by mouth daily. 90 tablet 1   No current facility-administered medications for this visit.     Allergies as of 11/13/2017 - Review Complete 09/09/2017  Allergen Reaction Noted  . Lisinopril  01/01/2015  . Losartan  09/11/2014    Vitals: There were no vitals taken for this visit.   Last Weight:  Wt Readings from Last 1 Encounters:  09/07/17 214 lb (97.1 kg)   TDV:VOHYW is no height or weight on file to calculate BMI.      Ht Readings from Last 1 Encounters:  05/18/17 5' 10.5" (1.791 m)    Physical exam:  General: The patient is awake, alert and appears not in acute distress. The patient is well groomed. Head: Normocephalic, atraumatic.  Neck is supple. Mallampati 3-  uvula is not visible even with use of tongue depression. neck circumference: 17.0" .   Nasal airflow unrestricted- TMJ is not evident. Retrognathia is seen.  Cardiovascular:  Regular rate and rhythm, without murmurs or carotid bruit, and without distended neck veins. Respiratory: Lungs are clear to auscultation. Skin:  Without evidence of edema, or rash  BMI is lower -, patient's posture is erect    Neurologic exam : The patient is awake and alert, oriented to place and time.   Speech is fluent,  without dysarthria, dysphonia or aphasia.  Mood and affect are  appropriate.  Cranial nerves:Reports loss of smell, but not of taste. Pupils are equal and briskly reactive to light.  Visual fields by finger perimetry are intact. Hearing to finger rub intact. Facial sensation intact to fine touch.Facial motor strength is symmetric and tongue and uvula move midline. Shoulder shrug was symmetrical.  The patient was advised of the nature of the diagnosed sleep disorder , the treatment options and risks for general a health and wellness arising from not treating the condition.  I spent more than 15 minutes of face to face time with the patient.  Greater than 50% of time was spent in counseling and coordination of care. We have discussed the diagnosis and differential and I answered the patient's questions.    I will order Modafinil after review of clinical symptoms and CPAP download form AUT[employer]AP.   Assessment:  After physical and neurologic examination, review of laboratory studies,  Personal review of imaging studies, reports of other /same  Imaging studies ,  Results of polysomnography/ neurophysiology testing and pre-existing records as far as provided in visit., my assessment is   MIld apnea , snoring and UARS, retrognathia.   1) continue  auto CPAP 5-12 cm water, mask of patient s choice. Compliance is good.  He sleeps better on CPAP, benefits in regards  form snoring reduction, and less nocturia.   2) Modafinil prn, refilled.     Asencion Partridge Darryon Bastin MD  11/13/2017   CC: Elby Showers, Md [address] [place], [place] [id number]

## 2017-11-13 NOTE — Patient Instructions (Signed)
Modafinil tablets  What is this medicine?  MODAFINIL (moe DAF i nil) is used to treat excessive sleepiness caused by certain sleep disorders. This includes narcolepsy, sleep apnea, and shift work sleep disorder.  This medicine may be used for other purposes; ask your health care provider or pharmacist if you have questions.  COMMON BRAND NAME(S): Provigil  What should I tell my health care provider before I take this medicine?  They need to know if you have any of these conditions:  -history of depression, mania, or other mental disorder  -kidney disease  -liver disease  -an unusual or allergic reaction to modafinil, other medicines, foods, dyes, or preservatives  -pregnant or trying to get pregnant  -breast-feeding  How should I use this medicine?  Take this medicine by mouth with a glass of water. Follow the directions on the prescription label. Take your doses at regular intervals. Do not take your medicine more often than directed. Do not stop taking except on your doctor's advice.  A special MedGuide will be given to you by the pharmacist with each prescription and refill. Be sure to read this information carefully each time.  Talk to your pediatrician regarding the use of this medicine in children. This medicine is not approved for use in children.  Overdosage: If you think you have taken too much of this medicine contact a poison control center or emergency room at once.  NOTE: This medicine is only for you. Do not share this medicine with others.  What if I miss a dose?  If you miss a dose, take it as soon as you can. If it is almost time for your next dose, take only that dose. Do not take double or extra doses.  What may interact with this medicine?  Do not take this medicine with any of the following medications:  -amphetamine or dextroamphetamine  -dexmethylphenidate or methylphenidate  -medicines called MAO Inhibitors like Nardil, Parnate, Marplan, Eldepryl  -pemoline  -procarbazine  This medicine may  also interact with the following medications:  -antifungal medicines like itraconazole or ketoconazole  -barbiturates like phenobarbital  -birth control pills or other hormone-containing birth control devices or implants  -carbamazepine  -cyclosporine  -diazepam  -medicines for depression, anxiety, or psychotic disturbances  -phenytoin  -propranolol  -triazolam  -warfarin  This list may not describe all possible interactions. Give your health care provider a list of all the medicines, herbs, non-prescription drugs, or dietary supplements you use. Also tell them if you smoke, drink alcohol, or use illegal drugs. Some items may interact with your medicine.  What should I watch for while using this medicine?  Visit your doctor or health care professional for regular checks on your progress. The full effects of this medicine may not be seen right away.  This medicine may affect your concentration, function, or may hide signs that you are tired. You may get dizzy. Do not drive, use machinery, or do anything that needs mental alertness until you know how this drug affects you. Alcohol can make you more dizzy and may interfere with your response to this medicine or your alertness. Avoid alcoholic drinks.  Birth control pills may not work properly while you are taking this medicine. Talk to your doctor about using an extra method of birth control.  It is unknown if the effects of this medicine will be increased by the use of caffeine. Caffeine is available in many foods, beverages, and medications. Ask your doctor if you should limit or   swelling of the face, lips, or tongue -anxiety -breathing problems -chest pain -fast, irregular  heartbeat -hallucinations -increased blood pressure -redness, blistering, peeling or loosening of the skin, including inside the mouth -sore throat, fever, or chills -suicidal thoughts or other mood changes -tremors -vomiting Side effects that usually do not require medical attention (report to your doctor or health care professional if they continue or are bothersome): -headache -nausea, diarrhea, or stomach upset -nervousness -trouble sleeping This list may not describe all possible side effects. Call your doctor for medical advice about side effects. You may report side effects to FDA at 1-800-FDA-1088. Where should I keep my medicine? Keep out of the reach of children. This medicine can be abused. Keep your medicine in a safe place to protect it from theft. Do not share this medicine with anyone. Selling or giving away this medicine is dangerous and against the law. This medicine may cause accidental overdose and death if taken by other adults, children, or pets. Mix any unused medicine with a substance like cat litter or coffee grounds. Then throw the medicine away in a sealed container like a sealed bag or a coffee can with a lid. Do not use the medicine after the expiration date. Store at room temperature between 20 and 25 degrees C (68 and 77 degrees F). NOTE: This sheet is a summary. It may not cover all possible information. If you have questions about this medicine, talk to your doctor, pharmacist, or health care provider.  2018 Elsevier/Gold Standard (2014-08-19 15:34:55)  

## 2017-11-14 ENCOUNTER — Other Ambulatory Visit: Payer: Commercial Managed Care - PPO | Admitting: Internal Medicine

## 2017-11-18 ENCOUNTER — Other Ambulatory Visit: Payer: Self-pay | Admitting: Internal Medicine

## 2017-11-18 DIAGNOSIS — J3089 Other allergic rhinitis: Secondary | ICD-10-CM

## 2017-11-18 DIAGNOSIS — J302 Other seasonal allergic rhinitis: Secondary | ICD-10-CM

## 2017-11-24 ENCOUNTER — Encounter: Payer: Self-pay | Admitting: Internal Medicine

## 2017-11-24 ENCOUNTER — Ambulatory Visit: Payer: Commercial Managed Care - PPO | Admitting: Internal Medicine

## 2017-11-24 VITALS — BP 170/100 | HR 65 | Ht 70.0 in | Wt 213.0 lb

## 2017-11-24 DIAGNOSIS — F411 Generalized anxiety disorder: Secondary | ICD-10-CM | POA: Diagnosis not present

## 2017-11-24 DIAGNOSIS — F439 Reaction to severe stress, unspecified: Secondary | ICD-10-CM | POA: Diagnosis not present

## 2017-11-24 DIAGNOSIS — E039 Hypothyroidism, unspecified: Secondary | ICD-10-CM | POA: Diagnosis not present

## 2017-11-24 DIAGNOSIS — E781 Pure hyperglyceridemia: Secondary | ICD-10-CM | POA: Diagnosis not present

## 2017-11-24 DIAGNOSIS — I10 Essential (primary) hypertension: Secondary | ICD-10-CM

## 2017-11-27 NOTE — Patient Instructions (Signed)
Monitor blood pressure at home and bring readings.  Continue same medications.  Try to exercise.  Return in mid January.

## 2017-11-27 NOTE — Progress Notes (Signed)
   Subjective:    Patient ID: RAJVIR ERNSTER, male    DOB: [birth year], 56 y.o.   MRN: [id number]  HPI His job was eliminated through a merger recently.  It ended the end of November.  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  Considerable situational stress discussed.  Blood pressure elevated 170/100 history of office hypertension and labile hypertension.  He is under the care of cardiologist.  Would like for him to start exercising again.  This seems to help.  In September blood pressure was 140/82.  TSH was checked in September and is within normal limits.  He also has hyperlipidemia.  In September triglycerides were 272 total cholesterol 193 with an HDL of 28 and LDL of 123.  On November 30 triglycerides had improved to 207, LDL improved to 70, cholesterol improved to 129.  I am pleased with this improvement.  I want him to monitor his blood pressure and follow-up with me in mid January.      Review of Systems     Objective:   Physical Exam  Neck supple.  Chest clear.  Cardiac exam regular rate and rhythm.      Assessment & Plan:  Essential hypertension  Labile hypertension  Office hypertension  Situational stress  Hyperlipidemia  Hypothyroidism  Plan: Return in mid January for further evaluation and follow-up on elevated blood pressure.

## 2017-12-01 DIAGNOSIS — L309 Dermatitis, unspecified: Secondary | ICD-10-CM | POA: Insufficient documentation

## 2017-12-01 DIAGNOSIS — J069 Acute upper respiratory infection, unspecified: Secondary | ICD-10-CM | POA: Insufficient documentation

## 2017-12-19 ENCOUNTER — Ambulatory Visit: Payer: Commercial Managed Care - PPO | Admitting: Cardiovascular Disease

## 2017-12-19 ENCOUNTER — Encounter: Payer: Self-pay | Admitting: Cardiovascular Disease

## 2017-12-19 VITALS — BP 122/84 | HR 68 | Ht 70.0 in | Wt 213.8 lb

## 2017-12-19 DIAGNOSIS — E782 Mixed hyperlipidemia: Secondary | ICD-10-CM

## 2017-12-19 DIAGNOSIS — I10 Essential (primary) hypertension: Secondary | ICD-10-CM

## 2017-12-19 LAB — BASIC METABOLIC PANEL
BUN/Creatinine Ratio: 12 (ref 9–20)
BUN: 13 mg/dL (ref 6–24)
CO2: 25 mmol/L (ref 20–29)
Calcium: 9.4 mg/dL (ref 8.7–10.2)
Chloride: 99 mmol/L (ref 96–106)
Creatinine, Ser: 1.05 mg/dL (ref 0.76–1.27)
GFR, EST AFRICAN AMERICAN: 91 mL/min/{1.73_m2} (ref >59)
GFR, EST NON AFRICAN AMERICAN: 78 mL/min/{1.73_m2} (ref >59)
Glucose: 104 mg/dL — ABNORMAL HIGH (ref 65–99)
POTASSIUM: 3.7 mmol/L (ref 3.5–5.2)
SODIUM: 141 mmol/L (ref 134–144)

## 2017-12-19 NOTE — Patient Instructions (Signed)
Medication Instructions:  Your physician recommends that you continue on your current medications as directed. Please refer to the Current Medication list given to you today.   Labwork: TODAY - basic metabolic panel   Testing/Procedures: None Ordered   Follow-Up: Your physician wants you to follow-up in: 6 months with Dr. Nahser. You will receive a reminder letter in the mail two months in advance. If you don't receive a letter, please call our office to schedule the follow-up appointment.   If you need a refill on your cardiac medications before your next appointment, please call your pharmacy.   Thank you for choosing [facility]! Juanmiguel Defelice, RN [phone]    

## 2017-12-19 NOTE — Progress Notes (Signed)
Cardiology Office Note   Date:  12/19/2017   ID:  Ricky Sharp, DOB [birth year], MRN [id number]  PCP:  Elby Showers, MD  Cardiologist:   Mertie Moores, MD   Chief Complaint  Patient presents with  . Hypertension      History of Present Illness: Ricky Sharp is a 57 y.o. male who presents for HTN  1. Hypertension 2. Anxiety - stress related 3. GERD  History of Present Illness:  Patient is a 57 year old gentleman with a history of hypertension. He's tried several medications in the past that have not completely controlled his blood pressure.  - He has tried Losartan but had some dizziness / dry mouth.  - tried bystolic 5 mg - felt poorly, persistant head ache, diarrhea, mild respiratory distress. Tried for a week  He has also tried some antianxiety meds but had various reactions.  He has multiple family and life stresses.   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 He is also having some family health issues that require him to spend lots of time with them.  He has felt well on his HCTZ and potassium since his last visit. He has not been exercising as much as he would like.  Oct. 1, 2014:  Ricky Sharp is a 57 yo with hx of HTN. He has not been exercising. He has gained 10 lbs. He has tried Losartan but it caused vertigo. He tried beta blockers but had severe fatigue and slow HR. He does not want to start any new meds. He knows that his weight is up and that is BP has been elevated.   March 04, 2014:  Ricky Sharp has been diagnosed with hypothyroidism since I last saw him. He is feeling somewhat better.  Diastolic BP has still been a bit high. Still eating some salt. BP at home is typically is the 80s - occasionally upper 80s.   Oct. 1, 2015:  Ricky Sharp is now off the Losartan - was having lots of fatigue and diarrhea. Symptoms are better. On HCTZ and blood  pressures are well controlled. Rides his bike on occasion. He also is taking Judo . Not Having any episodes of chest pain BP has been ok  Jan. 21, 2016:  Ricky Sharp has had HTN that has been difficult to manage.  He developed GI complaints with Losartan and with Lisinopril.  He had significant diarrhea on the Lisinopril - has resolved.   April 03, 2015:  Ricky Sharp has been seen in the hypertension clinic for the past several months. His blood pressure is now well controlled. No CP , no dyspnea. Still has anxiety.   April 04, 2016: Ricky Sharp is doing well  Weight is up a little  Mother died , lots of stress.  Amlodipine still causes diarrhea - ( like clockwork)   May 18, 2017  BP has been well controled at home.   Still eating a little more salt than he should. Not as active.   Weight is staying elevated.  Mother died 2 years ago and he had a struggle working through the estate.  No exercising   Jan. 8, 2018:  Ricky Sharp is seen today for follow up of his HTN. Exercising some.  Hoping to get out more . 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       Wt Readings from Last 3 Encounters:  12/19/17 213 lb 12.8 oz (  97 kg)  11/24/17 213 lb (96.6 kg)  11/13/17 213 lb (96.6 kg)     Past Medical History:  Diagnosis Date  . Eczema   . Recurrent upper respiratory infection (URI)     Past Surgical History:  Procedure Laterality Date  . CHOLECYSTECTOMY    . OSTEOCHONDROMA EXCISION    . TONSILLECTOMY    . WISDOM TOOTH EXTRACTION       Current Outpatient Medications  Medication Sig Dispense Refill  . ALPRAZolam (XANAX) 0.25 MG tablet Take 1 tablet (0.25 mg total) by mouth 2 (two) times daily as needed for anxiety. 60 tablet 3  . Azelastine-Fluticasone (DYMISTA) 137-50 MCG/ACT SUSP USE 1-2 SPRAYS IN EACH NOSTRIL AT BEDTIME AS DIRECTED 23 g 5  . CARAFATE 1 GM/10ML suspension Take by mouth as directed.     . cromolyn (NASALCROM) 5.2 MG/ACT nasal spray Place 1 spray  into the nose 4 (four) times daily.      Marland Kitchen dexlansoprazole (DEXILANT) 60 MG capsule Take 60 mg by mouth daily as needed.     . diltiazem (DILT-XR) 240 MG 24 hr capsule Take 1 capsule (240 mg total) by mouth daily. 90 capsule 3  . escitalopram (LEXAPRO) 10 MG tablet TAKE (1/2) TABLET DAILY. 45 tablet 3  . hydrochlorothiazide (HYDRODIURIL) 25 MG tablet TAKE 1 TABLET DAILY. 90 tablet 2  . ibuprofen (ADVIL,MOTRIN) 200 MG tablet Take 200 mg by mouth every 6 (six) hours as needed.      Marland Kitchen levothyroxine (SYNTHROID, LEVOTHROID) 88 MCG tablet TAKE 1 TABLET ONCE DAILY. 31 tablet 11  . modafinil (PROVIGIL) 200 MG tablet Take 1 tablet (200 mg total) by mouth daily. 30 tablet 5  . Multiple Vitamins-Minerals (CENTRUM SILVER) tablet Take 1 tablet by mouth daily.    Marland Kitchen olopatadine (PATANOL) 0.1 % ophthalmic solution Place 1 drop into both eyes 2 (two) times daily. As needed 5 mL prn  . ORACEA 40 MG capsule Take 40 mg by mouth daily as needed.     . potassium chloride (K-DUR) 10 MEQ tablet TAKE (2) TABLETS TWICE DAILY. 360 tablet 2  . rosuvastatin (CRESTOR) 5 MG tablet Take 1 tablet (5 mg total) by mouth daily. 90 tablet 1   No current facility-administered medications for this visit.     Allergies:   Lisinopril and Losartan    Social History:  The patient  reports that  has never smoked. he has never used smokeless tobacco. He reports that he drinks about 1.2 oz of alcohol per week. He reports that he does not use drugs.   Family History:  The patient's family history includes Fibromyalgia in his mother; Lung disease in his mother; Syncope episode in his mother. He was adopted.    ROS:  Please see the history of present illness.   Otherwise, review of systems are positive for none.   All other systems are reviewed and negative.    Physical Exam: Blood pressure 122/84, pulse 68, height 5\' 10"  (1.778 m), weight 213 lb 12.8 oz (97 kg), SpO2 98 %.  GEN:  Well nourished, well developed in no acute  distress HEENT: Normal NECK: No JVD; No carotid bruits LYMPHATICS: No lymphadenopathy CARDIAC: RR,  No significant murmurs  RESPIRATORY:  Clear to auscultation without rales, wheezing or rhonchi  ABDOMEN: Soft, non-tender, non-distended MUSCULOSKELETAL:  No edema; No deformity  SKIN: Warm and dry NEUROLOGIC:  Alert and oriented x 3   EKG:    Recent Labs: 08/22/2017: TSH 2.55 11/10/2017: ALT 43  Lipid Panel    Component Value Date/Time   CHOL 129 11/10/2017 1009   TRIG 207 (H) 11/10/2017 1009   HDL 29 (L) 11/10/2017 1009   CHOLHDL 4.4 11/10/2017 1009   VLDL 61 (H) 12/13/2016 1135   LDLCALC 104 (H) 12/13/2016 1135      Wt Readings from Last 3 Encounters:  12/19/17 213 lb 12.8 oz (97 kg)  11/24/17 213 lb (96.6 kg)  11/13/17 213 lb (96.6 kg)      Other studies Reviewed: Additional studies/ records that were reviewed today include: . Review of the above records demonstrates:    ASSESSMENT AND PLAN:  1.  Essential  Hypertension:  Ricky Sharp presents again with hypertension. He has tried by [employer] , losartan, lisinopril, Amlodipine  but did not tolerate any of these medications.   Seems to tolerate the Diltiazem   . Seems to be doing well on Diltiazem.   Encouraged him to work on a better diet  and exercise program.  I think his blood pressure control improved with a little bit of weight loss. He is on hydrochlorothiazide as well as diltiazem.  We will check a basic metabolic profile today.  2.  Hyperlipidemia :    Is been managed by Dr. Renold Genta.  His lipid look okay.  His triglyceride levels remain a little high.     Current medicines are reviewed at length with the patient today.  The patient does not have concerns regarding medicines.  The following changes have been made:      Labs/ tests ordered today include:   Orders Placed This Encounter  Procedures  . Basic Metabolic Panel (BMET)    Disposition:   FU with me  in 6 months   Signed, Mertie Moores, MD   12/19/2017 9:03 AM    [facility] [place], [place], [place]  [address] Phone: [phone]; Fax: [phone]

## 2017-12-28 ENCOUNTER — Ambulatory Visit: Payer: Commercial Managed Care - PPO | Admitting: Internal Medicine

## 2017-12-28 ENCOUNTER — Encounter: Payer: Self-pay | Admitting: Internal Medicine

## 2017-12-28 VITALS — BP 138/78 | HR 71 | Ht 70.0 in | Wt 214.0 lb

## 2017-12-28 DIAGNOSIS — F439 Reaction to severe stress, unspecified: Secondary | ICD-10-CM

## 2017-12-28 DIAGNOSIS — I10 Essential (primary) hypertension: Secondary | ICD-10-CM

## 2017-12-28 NOTE — Patient Instructions (Signed)
Continue current antihypertensive medications.  We will arrange for physical exam before the end of March

## 2017-12-28 NOTE — Progress Notes (Signed)
   Subjective:    Patient ID: Ricky Sharp, male    DOB: 12/15/1960, 57 y.o.   MRN: [id number]  HPI Brings in multiple blood pressures from home with systolic readings 678 to 938 and diastolic readings 73 to 81.  Saw Dr. Acie Fredrickson recently on January 8 and note was made of multiple drug intolerances including Bystolic, losartan, lisinopril, amlodipine.  He does tolerate diltiazem and remains on diet.  He continues on HCTZ.  A basic metabolic panel was checked by Cardiology.  Potassium, BUN and creatinine were normal.  He remains on thyroid replacement.  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  His last physical exam was February 10, 2017.  We will see that he gets a complete physical before the end of March.  Blood pressure stable today at 138/78 which is good because he has an element of office hypertension  Review of Systems see above     Objective:   Physical Exam  Patient was not examined.  Spent 20 minutes speaking with him about these issues.      Assessment & Plan:  Office hypertension  Essential hypertension  Hypothyroidism  Plan: We will arrange for physical exam before the end of March.

## 2018-01-25 ENCOUNTER — Other Ambulatory Visit: Payer: Self-pay | Admitting: Internal Medicine

## 2018-02-01 ENCOUNTER — Other Ambulatory Visit: Payer: Self-pay

## 2018-02-01 DIAGNOSIS — Z0000 Encounter for general adult medical examination without abnormal findings: Secondary | ICD-10-CM

## 2018-02-01 DIAGNOSIS — E785 Hyperlipidemia, unspecified: Secondary | ICD-10-CM

## 2018-02-01 DIAGNOSIS — I10 Essential (primary) hypertension: Secondary | ICD-10-CM

## 2018-02-01 DIAGNOSIS — E039 Hypothyroidism, unspecified: Secondary | ICD-10-CM

## 2018-02-01 DIAGNOSIS — Z125 Encounter for screening for malignant neoplasm of prostate: Secondary | ICD-10-CM

## 2018-02-22 ENCOUNTER — Other Ambulatory Visit: Payer: Commercial Managed Care - PPO | Admitting: Internal Medicine

## 2018-02-27 ENCOUNTER — Encounter: Payer: Commercial Managed Care - PPO | Admitting: Internal Medicine

## 2018-02-27 ENCOUNTER — Other Ambulatory Visit: Payer: Self-pay | Admitting: Internal Medicine

## 2018-03-08 ENCOUNTER — Telehealth: Payer: Self-pay | Admitting: Internal Medicine

## 2018-03-08 NOTE — Telephone Encounter (Signed)
Calling to see if you will assist him in selecting a good eye professional from the approved list below that his insurance will allow him to select.  The following are on the approved providers.  He wants to know which one you would suggest from the approved below:  [facility] [place]  Thank you.  Phone:  [phone]

## 2018-03-08 NOTE — Telephone Encounter (Signed)
[facility] or [facility]

## 2018-03-08 NOTE — Telephone Encounter (Signed)
Called patient and provided him with the choices from Dr. Renold Genta.  He will call one of these 2 and move forward with one of these.

## 2018-05-21 ENCOUNTER — Ambulatory Visit
Admission: RE | Admit: 2018-05-21 | Discharge: 2018-05-21 | Disposition: A | Discharge status: Home / Self Care | Payer: BC Managed Care – PPO | Source: Ambulatory Visit | Attending: Internal Medicine | Admitting: Internal Medicine

## 2018-05-21 ENCOUNTER — Encounter: Payer: Self-pay | Admitting: Internal Medicine

## 2018-05-21 ENCOUNTER — Ambulatory Visit (INDEPENDENT_AMBULATORY_CARE_PROVIDER_SITE_OTHER): Payer: BC Managed Care – PPO | Admitting: Internal Medicine

## 2018-05-21 ENCOUNTER — Other Ambulatory Visit: Payer: BC Managed Care – PPO | Admitting: Internal Medicine

## 2018-05-21 VITALS — BP 140/80 | HR 95 | Temp 99.7°F | Ht 70.0 in | Wt 222.0 lb

## 2018-05-21 DIAGNOSIS — R6883 Chills (without fever): Secondary | ICD-10-CM | POA: Diagnosis not present

## 2018-05-21 DIAGNOSIS — E785 Hyperlipidemia, unspecified: Secondary | ICD-10-CM

## 2018-05-21 DIAGNOSIS — R05 Cough: Secondary | ICD-10-CM

## 2018-05-21 DIAGNOSIS — Z125 Encounter for screening for malignant neoplasm of prostate: Secondary | ICD-10-CM

## 2018-05-21 DIAGNOSIS — E039 Hypothyroidism, unspecified: Secondary | ICD-10-CM

## 2018-05-21 DIAGNOSIS — Z0000 Encounter for general adult medical examination without abnormal findings: Secondary | ICD-10-CM

## 2018-05-21 DIAGNOSIS — I10 Essential (primary) hypertension: Secondary | ICD-10-CM

## 2018-05-21 DIAGNOSIS — R509 Fever, unspecified: Secondary | ICD-10-CM

## 2018-05-21 DIAGNOSIS — R059 Cough, unspecified: Secondary | ICD-10-CM

## 2018-05-21 LAB — COMPLETE METABOLIC PANEL WITH GFR
AG RATIO: 1.6 (calc) (ref 1.0–2.5)
ALT: 43 U/L (ref 9–46)
AST: 27 U/L (ref 10–35)
Albumin: 4.3 g/dL (ref 3.6–5.1)
Alkaline phosphatase (APISO): 74 U/L (ref 40–115)
BUN: 12 mg/dL (ref 7–25)
CALCIUM: 9.2 mg/dL (ref 8.6–10.3)
CO2: 30 mmol/L (ref 20–32)
CREATININE: 1.08 mg/dL (ref 0.70–1.33)
Chloride: 99 mmol/L (ref 98–110)
GFR, EST NON AFRICAN AMERICAN: 75 mL/min/{1.73_m2} (ref >=60)
GFR, Est African American: 87 mL/min/{1.73_m2} (ref >=60)
Globulin: 2.7 g/dL (calc) (ref 1.9–3.7)
Glucose, Bld: 95 mg/dL (ref 65–99)
POTASSIUM: 4.1 mmol/L (ref 3.5–5.3)
SODIUM: 136 mmol/L (ref 135–146)
Total Bilirubin: 0.8 mg/dL (ref 0.2–1.2)
Total Protein: 7 g/dL (ref 6.1–8.1)

## 2018-05-21 LAB — PSA: PSA: 0.1 ng/mL (ref <=4.0)

## 2018-05-21 LAB — CBC WITH DIFFERENTIAL/PLATELET
BASOS ABS: 77 {cells}/uL (ref 0–200)
BASOS PCT: 1.1 %
EOS ABS: 343 {cells}/uL (ref 15–500)
Eosinophils Relative: 4.9 %
HCT: 45.5 % (ref 38.5–50.0)
HEMOGLOBIN: 16.1 g/dL (ref 13.2–17.1)
Lymphs Abs: 973 cells/uL (ref 850–3900)
MCH: 30.7 pg (ref 27.0–33.0)
MCHC: 35.4 g/dL (ref 32.0–36.0)
MCV: 86.7 fL (ref 80.0–100.0)
MONOS PCT: 11.6 %
MPV: 10.2 fL (ref 7.5–12.5)
NEUTROS ABS: 4795 {cells}/uL (ref 1500–7800)
Neutrophils Relative %: 68.5 %
Platelets: 227 10*3/uL (ref 140–400)
RBC: 5.25 10*6/uL (ref 4.20–5.80)
RDW: 13.1 % (ref 11.0–15.0)
Total Lymphocyte: 13.9 %
WBC: 7 10*3/uL (ref 3.8–10.8)
WBCMIX: 812 {cells}/uL (ref 200–950)

## 2018-05-21 LAB — LIPID PANEL
CHOLESTEROL: 119 mg/dL (ref <200)
HDL: 28 mg/dL — ABNORMAL LOW (ref >40)
LDL Cholesterol (Calc): 65 mg/dL (calc)
Non-HDL Cholesterol (Calc): 91 mg/dL (calc) (ref <130)
Total CHOL/HDL Ratio: 4.3 (calc) (ref <5.0)
Triglycerides: 185 mg/dL — ABNORMAL HIGH (ref <150)

## 2018-05-21 LAB — POCT INFLUENZA A/B
Influenza A, POC: NEGATIVE
Influenza B, POC: NEGATIVE

## 2018-05-21 LAB — TSH: TSH: 1.63 m[IU]/L (ref 0.40–4.50)

## 2018-05-21 MED ORDER — LEVOFLOXACIN 500 MG PO TABS: 500.0000 mg | ORAL_TABLET | Freq: Every day | ORAL | 0 refills | Status: DC

## 2018-05-21 NOTE — Progress Notes (Signed)
   Subjective:    Patient ID: Ricky Sharp, male    DOB: Mar 15, 1961, 57 y.o.   MRN: [id number]  HPI 57 year old Male currently working at [employer] in [place] came down with fever, malaise, cough, discolored nasal drainage and chills yesterday. Out of work today. No N,V,D. Rapid flu test is negative.    Review of Systems see above     Objective:   Physical Exam Pharynx very slightly injected without exudate.  TMs dull but not red.  Neck is supple without adenopathy.  Chest clear with the exception of bases bilaterally where there are decreased breath sounds       Assessment & Plan:  Acute  febrile illness  Rule out pneumonia  Plan: Chest x-ray today.  Levaquin 500 mg daily for 10 days.  Out of work today and tomorrow.  Note provided.  Tylenol as needed for fever.  Rest and drink plenty of fluids.  Addendum: Chest x-ray is negative.  He had fasting labs drawn this morning for upcoming CPE so CBC is pending.

## 2018-05-21 NOTE — Patient Instructions (Signed)
Levaquin 500 mg daily x 10 days.  Tylenol as needed for fever.  Rest and drink plenty of fluids.  Note to be out of work today and tomorrow.  Chest x-ray pending.

## 2018-05-25 ENCOUNTER — Ambulatory Visit (INDEPENDENT_AMBULATORY_CARE_PROVIDER_SITE_OTHER): Payer: BC Managed Care – PPO | Admitting: Internal Medicine

## 2018-05-25 ENCOUNTER — Encounter: Payer: Self-pay | Admitting: Internal Medicine

## 2018-05-25 VITALS — BP 142/90 | HR 60 | Ht 70.0 in | Wt 222.0 lb

## 2018-05-25 DIAGNOSIS — J22 Unspecified acute lower respiratory infection: Secondary | ICD-10-CM | POA: Diagnosis not present

## 2018-05-25 DIAGNOSIS — E781 Pure hyperglyceridemia: Secondary | ICD-10-CM

## 2018-05-25 DIAGNOSIS — K219 Gastro-esophageal reflux disease without esophagitis: Secondary | ICD-10-CM | POA: Diagnosis not present

## 2018-05-25 DIAGNOSIS — G4719 Other hypersomnia: Secondary | ICD-10-CM

## 2018-05-25 DIAGNOSIS — E039 Hypothyroidism, unspecified: Secondary | ICD-10-CM | POA: Diagnosis not present

## 2018-05-25 DIAGNOSIS — J302 Other seasonal allergic rhinitis: Secondary | ICD-10-CM

## 2018-05-25 DIAGNOSIS — Z0000 Encounter for general adult medical examination without abnormal findings: Secondary | ICD-10-CM

## 2018-05-25 DIAGNOSIS — F411 Generalized anxiety disorder: Secondary | ICD-10-CM

## 2018-05-25 DIAGNOSIS — I10 Essential (primary) hypertension: Secondary | ICD-10-CM

## 2018-05-25 DIAGNOSIS — J3089 Other allergic rhinitis: Secondary | ICD-10-CM

## 2018-05-25 LAB — POCT URINALYSIS DIPSTICK
Appearance: NORMAL
Bilirubin, UA: NEGATIVE
Glucose, UA: NEGATIVE
Ketones, UA: NEGATIVE
LEUKOCYTES UA: NEGATIVE
NITRITE UA: NEGATIVE
Odor: NORMAL
PROTEIN UA: NEGATIVE
RBC UA: NEGATIVE
SPEC GRAV UA: 1.015 (ref 1.010–1.025)
Urobilinogen, UA: 0.2 E.U./dL
pH, UA: 6.5 (ref 5.0–8.0)

## 2018-05-25 NOTE — Progress Notes (Signed)
Subjective:    Patient ID: Ricky Sharp, male    DOB: 28-Oct-1961, 56 y.o.   MRN: [id number]  HPI  57 year old Male for health maintenance exam and evaluation of medical issues. Due for colonoscopy 2020 by Dr. Collene Mares.  Recently seen for viral syndrome placed on antibiotics for respiratory symptoms with hx of asthma and has improved.   Social history: He is married.  2 daughters.  One daughter still in college.  Wife is an Forensic psychologist.  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  Non-smoker.  Social alcohol consumption.  Family Hx: Mother with history of asthma died with heart failure.  No siblings.  Does not know his biological father.  History of anxiety and depression, hypertension, excessive daytime sleepiness, GE reflux, hypothyroidism.  History of obstructive sleep apnea treated with CPAP.  History of allergic rhinitis.  History of asthma.  Followed by Dr. Acie Fredrickson, cardiologist for hypertension.  In July 2017 he had acute diverticulitis of the sigmoid colon treated with antibiotics.    Negative myocardial perfusion study 2003 when he was admitted to the hospital  with chest and epigastric pain.  He had upper endoscopy and was diagnosed with peptic ulcer disease secondary to H. pylori infection.  MI was ruled out.    Cholecystectomy 2004.  History of asthma and allergic rhinitis for which has seen Dr. Keturah Barre.  Has been on immunotherapy.  Has  perennial and seasonal rhinitis.  Tends to wheeze with respiratory infections.  Additional past medical history: Fractured left clavicle 1981.  History of hepatitis 1966.  1998 he had a positive Hepatitis B antibody and had never been immunized for hepatitis B.  History of allergic rhinitis.  Wisdom teeth extracted 1982.  Osteochondroma removed from right tibia in 1983.  Kidney stone 2003.  Laceration of right hand in August 2017 requiring sutures.     Review of Systems no new complaints       Objective:   Physical Exam  Constitutional: He is oriented to person, place, and time. He appears well-developed and well-nourished. No distress.  HENT:  Head: Normocephalic and atraumatic.  Right Ear: External ear normal.  Left Ear: External ear normal.  Nose: Nose normal.  Mouth/Throat: Oropharynx is clear and moist.  Eyes: Pupils are equal, round, and reactive to light. EOM are normal. Right eye exhibits no discharge. Left eye exhibits no discharge. No scleral icterus.  Neck: Neck supple. No JVD present. No thyromegaly present.  Cardiovascular: Normal rate, regular rhythm, normal heart sounds and intact distal pulses.  No murmur heard. Pulmonary/Chest: Effort normal and breath sounds normal. No stridor. No respiratory distress. He has no wheezes. He has no rales. He exhibits no tenderness.  Abdominal: Soft. He exhibits no distension and no mass. There is no tenderness. There is no rebound and no guarding. No hernia.  Genitourinary: Prostate normal.  Musculoskeletal: He exhibits no edema.  Lymphadenopathy:    He has no cervical adenopathy.  Neurological: He is alert and oriented to person, place, and time. He displays normal reflexes. No cranial nerve deficit or sensory deficit. He exhibits normal muscle tone. Coordination normal.  Skin: Skin is warm and dry. No rash noted. He is not diaphoretic. No erythema.  Psychiatric: He has a normal mood and affect. His behavior is normal. Judgment and thought content normal.          Assessment & Plan:  Anxiety depression currently on treatment  with Lexapro  Essential hypertension-stable  Hypothyroidism-stable with current dose of thyroid replacement.  TSH normal at 1.63  Sleep apnea treated with CPAP  Excessive daytime sleepiness has been treated with Provigil by neurologist  Allergic rhinitis currently on treatment  Hypertriglyceridemia-triglycerides have improved from 207 to 185.  Needs to get more exercise.  He is on Crestor 5  mg daily  GE reflux treated with Dexilant  Plan: Continue current medications and follow-up in 6 months with office visit, TSH and fasting lipid panel as well as blood pressure check.  Addendum: He called 06/04/2018 same that cough and congestion were still present.  We have called in doxycycline 100 mg twice daily for 10 days instead of repeating Levaquin.

## 2018-05-29 ENCOUNTER — Other Ambulatory Visit: Payer: Self-pay | Admitting: Cardiovascular Disease

## 2018-05-30 MED ORDER — DILTIAZEM HCL ER 240 MG PO CP24: 240.0000 mg | ORAL_CAPSULE | Freq: Every day | ORAL | 1 refills | Status: DC

## 2018-05-30 NOTE — Addendum Note (Signed)
Addended by: Derl Barrow on: 05/30/2018 08:26 AM   Modules accepted: Orders

## 2018-06-04 ENCOUNTER — Telehealth: Payer: Self-pay | Admitting: Internal Medicine

## 2018-06-04 MED ORDER — DOXYCYCLINE HYCLATE 100 MG PO TABS: 100.0000 mg | ORAL_TABLET | Freq: Two times a day (BID) | ORAL | 0 refills | Status: DC

## 2018-06-04 NOTE — Telephone Encounter (Signed)
Call in Doxycycline 100 mg bid x 10 days. Stay out of sun with this antibiotic.

## 2018-06-04 NOTE — Telephone Encounter (Signed)
Patient states that he is still having cough/congestion and has finished his antibiotic.  Wants to know if you want him to have another shot or want him to have any additional antibiotics?  He is still having some green congestion.  No fever, but the still some cough and the congestion just won't seem to let up.    Phone #:  [phone]  Thank you.

## 2018-06-04 NOTE — Telephone Encounter (Signed)
E-SCRIBED, patient is aware of recommendations.

## 2018-06-10 NOTE — Patient Instructions (Signed)
It was a pleasure to see you today.  Continue same medications and follow-up in 6 months 

## 2018-07-30 ENCOUNTER — Telehealth: Payer: Self-pay | Admitting: Neurology

## 2018-07-30 ENCOUNTER — Other Ambulatory Visit: Payer: Self-pay | Admitting: Internal Medicine

## 2018-07-30 DIAGNOSIS — Z9989 Dependence on other enabling machines and devices: Principal | ICD-10-CM

## 2018-07-30 DIAGNOSIS — G4733 Obstructive sleep apnea (adult) (pediatric): Secondary | ICD-10-CM

## 2018-07-30 NOTE — Telephone Encounter (Signed)
Pt is wanting to try the dental device and who does Dr D refer patients to for this. Please call

## 2018-07-31 NOTE — Telephone Encounter (Signed)
Pt returned call and we went over this information. Pt is asking the referral be placed to Dr Toy Cookey for the dental device. Pt is hoping that if accurately treated with the dental device he would like to dc CPAP. Referral placed for the pt. Pt verbalized understanding and was appreciative for the call.

## 2018-07-31 NOTE — Addendum Note (Signed)
Addended by: Darleen Crocker on: 07/31/2018 09:09 AM   Modules accepted: Orders

## 2018-07-31 NOTE — Telephone Encounter (Signed)
Pt returning RNs call stating he is wanting to keep his CPAP but not use both at the same time. Pt requesting to speak with RN to discuss different out comes. RN able to speak

## 2018-07-31 NOTE — Telephone Encounter (Signed)
Called the patient. No answer. LVM informing him that Dr [name] had to look over the sleep study results from 2016 and assess if he was a dental device cantidate and she feels that he could certainly try a dental device. We refer to Dr Ron Parker or Dr Toy Cookey here in [place] for the dental device. I have requested the patient call us back and just confirm he would like for [place] to place the referral for him.  When pt calls back please ask does he plan to use the CPAP and dental device together or just discontinue CPAP altogether and use dental device? Also ask does he want [place] to go ahead and place the referral for him.  If so we can place that for him

## 2018-08-09 ENCOUNTER — Other Ambulatory Visit: Payer: Self-pay | Admitting: Internal Medicine

## 2018-08-16 ENCOUNTER — Ambulatory Visit: Payer: BC Managed Care – PPO | Admitting: Cardiovascular Disease

## 2018-08-20 ENCOUNTER — Encounter: Payer: Self-pay | Admitting: Cardiovascular Disease

## 2018-08-20 ENCOUNTER — Ambulatory Visit: Payer: BC Managed Care – PPO | Admitting: Cardiovascular Disease

## 2018-08-20 VITALS — BP 140/80 | HR 69 | Ht 70.0 in | Wt 222.1 lb

## 2018-08-20 DIAGNOSIS — I10 Essential (primary) hypertension: Secondary | ICD-10-CM | POA: Diagnosis not present

## 2018-08-20 DIAGNOSIS — E782 Mixed hyperlipidemia: Secondary | ICD-10-CM

## 2018-08-20 NOTE — Patient Instructions (Signed)

## 2018-08-20 NOTE — Progress Notes (Signed)
Cardiology Office Note   Date:  08/20/2018   ID:  [name], DOB Nov 24, 1961, MRN [id number]  PCP:  Ricky Showers, MD  Cardiologist:   Ricky Moores, MD   Chief Complaint  Patient presents with  . Hypertension      History of Present Illness: Ricky Sharp is a 57 y.o. male who presents for HTN  1. Hypertension 2. Anxiety - stress related 3. GERD  History of Present Illness:  Patient is a 57 year old gentleman with a history of hypertension. He's tried several medications in the past that have not completely controlled his blood pressure.  - He has tried Losartan but had some dizziness / dry mouth.  - tried bystolic 5 mg - felt poorly, persistant head ache, diarrhea, mild respiratory distress. Tried for a week  He has also tried some antianxiety meds but had various reactions.  He has multiple family and life stresses.   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 He is also having some family health issues that require him to spend lots of time with them.  He has felt well on his HCTZ and potassium since his last visit. He has not been exercising as much as he would like.  Oct. 1, 2014:  Ricky Sharp is a 57 yo with hx of HTN. He has not been exercising. He has gained 10 lbs. He has tried Losartan but it caused vertigo. He tried beta blockers but had severe fatigue and slow HR. He does not want to start any new meds. He knows that his weight is up and that is BP has been elevated.   March 04, 2014:  Ricky Sharp has been diagnosed with hypothyroidism since I last saw him. He is feeling somewhat better.  Diastolic BP has still been a bit high. Still eating some salt. BP at home is typically is the 80s - occasionally upper 80s.   Oct. 1, 2015:  Ricky Sharp is now off the Losartan - was having lots of fatigue and diarrhea. Symptoms are better. On HCTZ and blood  pressures are well controlled. Rides his bike on occasion. He also is taking Judo . Not Having any episodes of chest pain BP has been ok  Jan. 21, 2016:  Ricky Sharp has had HTN that has been difficult to manage.  He developed GI complaints with Losartan and with Lisinopril.  He had significant diarrhea on the Lisinopril - has resolved.   April 03, 2015:  Ricky Sharp has been seen in the hypertension clinic for the past several months. His blood pressure is now well controlled. No CP , no dyspnea. Still has anxiety.   April 04, 2016: Ricky Sharp is doing well  Weight is up a little  Mother died , lots of stress.  Amlodipine still causes diarrhea - ( like clockwork)   May 18, 2017  BP has been well controled at home.   Still eating a little more salt than he should. Not as active.   Weight is staying elevated.  Mother died 2 years ago and he had a struggle working through the estate.  No exercising   Jan. 8, 2018:  Ricky Sharp is seen today for follow up of his HTN. Exercising some.  Hoping to get out more . 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       December 20, 2017:  Ricky Sharp, seen today for further evaluation and management  of his hypertension. No CP or dyspnea . Eats some salt,  Not much  Does not exercise  - HDL is 28 Had a reaction to Losartan and Lisinopril    Wt Readings from Last 3 Encounters:  08/20/18 222 lb 1.9 oz (100.8 kg)  05/25/18 222 lb (100.7 kg)  05/21/18 222 lb (100.7 kg)     Past Medical History:  Diagnosis Date  . Eczema   . Recurrent upper respiratory infection (URI)     Past Surgical History:  Procedure Laterality Date  . CHOLECYSTECTOMY    . OSTEOCHONDROMA EXCISION    . TONSILLECTOMY    . WISDOM TOOTH EXTRACTION       Current Outpatient Medications  Medication Sig Dispense Refill  . ALPRAZolam (XANAX) 0.25 MG tablet Take 1 tablet (0.25 mg total) by mouth 2 (two) times daily as needed for anxiety. 60 tablet 3  .  Azelastine-Fluticasone (DYMISTA) 137-50 MCG/ACT SUSP USE 1-2 SPRAYS IN EACH NOSTRIL AT BEDTIME AS DIRECTED 23 g 5  . cromolyn (NASALCROM) 5.2 MG/ACT nasal spray Place 1 spray into the nose 4 (four) times daily.      Ricky Sharp dexlansoprazole (DEXILANT) 60 MG capsule Take 60 mg by mouth daily as needed.     . diltiazem (DILT-XR) 240 MG 24 hr capsule Take 1 capsule (240 mg total) by mouth daily. 90 capsule 1  . doxycycline (VIBRA-TABS) 100 MG tablet Take 1 tablet (100 mg total) by mouth 2 (two) times daily. 20 tablet 0  . escitalopram (LEXAPRO) 10 MG tablet TAKE (1/2) TABLET DAILY. 45 tablet 3  . hydrochlorothiazide (HYDRODIURIL) 25 MG tablet TAKE 1 TABLET DAILY. 90 tablet 2  . ibuprofen (ADVIL,MOTRIN) 200 MG tablet Take 200 mg by mouth every 6 (six) hours as needed.      Ricky Sharp levothyroxine (SYNTHROID, LEVOTHROID) 88 MCG tablet TAKE 1 TABLET ONCE DAILY. 31 tablet 0  . modafinil (PROVIGIL) 200 MG tablet Take 1 tablet (200 mg total) by mouth daily. 30 tablet 5  . Multiple Vitamins-Minerals (CENTRUM SILVER) tablet Take 1 tablet by mouth daily.    Ricky Sharp olopatadine (PATANOL) 0.1 % ophthalmic solution Place 1 drop into both eyes 2 (two) times daily. As needed 5 mL prn  . ORACEA 40 MG capsule Take 40 mg by mouth daily as needed.     . potassium chloride (K-DUR) 10 MEQ tablet TAKE (2) TABLETS TWICE DAILY. 360 tablet 2  . rosuvastatin (CRESTOR) 5 MG tablet TAKE 1 TABLET ONCE DAILY. 31 tablet 0   No current facility-administered medications for this visit.     Allergies:   Lisinopril and Losartan    Social History:  The patient  reports that he has never smoked. He has never used smokeless tobacco. He reports that he drinks about 2.0 standard drinks of alcohol per week. He reports that he does not use drugs.   Family History:  The patient's family history includes Fibromyalgia in his mother; Lung disease in his mother; Syncope episode in his mother. He was adopted.   Physical Exam: Blood pressure 140/80, pulse 69,  height 5\' 10"  (1.778 m), weight 222 lb 1.9 oz (100.8 kg), SpO2 99 %.  GEN:  Well nourished, well developed in no acute distress HEENT: Normal NECK: No JVD; No carotid bruits LYMPHATICS: No lymphadenopathy CARDIAC: RRR  RESPIRATORY:  Clear to auscultation without rales, wheezing or rhonchi  ABDOMEN: Soft, non-tender, non-distended MUSCULOSKELETAL:  No edema; No deformity  SKIN: Warm and dry NEUROLOGIC:  Alert and oriented x 3  ROS:  Please see the history of present illness.   Otherwise, review of systems are positive for none.   All other systems are reviewed and negative.     EKG:    August 20, 2018: Normal sinus rhythm at 69.  First-degree AV block.  Nonspecific ST ab normality.  Recent Labs: 05/21/2018: ALT 43; BUN 12; Creat 1.08; Hemoglobin 16.1; Platelets 227; Potassium 4.1; Sodium 136; TSH 1.63    Lipid Panel    Component Value Date/Time   CHOL 119 05/21/2018 0906   TRIG 185 (H) 05/21/2018 0906   HDL 28 (L) 05/21/2018 0906   CHOLHDL 4.3 05/21/2018 0906   VLDL 61 (H) 12/13/2016 1135   LDLCALC 65 05/21/2018 0906      Wt Readings from Last 3 Encounters:  08/20/18 222 lb 1.9 oz (100.8 kg)  05/25/18 222 lb (100.7 kg)  05/21/18 222 lb (100.7 kg)      Other studies Reviewed: Additional studies/ records that were reviewed today include: . Review of the above records demonstrates:    ASSESSMENT AND PLAN:  1.  Essential  Hypertension:  BP is borderline elevated  Advised him to eat less salt.  Advised and exercise.  2.  Hyperlipidemia :      On crestor.  Labs look good     Current medicines are reviewed at length with the patient today.  The patient does not have concerns regarding medicines.  The following changes have been made:      Labs/ tests ordered today include:   No orders of the defined types were placed in this encounter.   Disposition:   FU with me  In  1 year    Signed, Ricky Moores, MD  08/20/2018 8:01 AM    [facility]  [facility] [place], [place], [place]  [address] Phone: [phone]; Fax: [phone]

## 2018-08-31 ENCOUNTER — Other Ambulatory Visit: Payer: Self-pay | Admitting: Internal Medicine

## 2018-09-13 ENCOUNTER — Other Ambulatory Visit: Payer: Self-pay | Admitting: Internal Medicine

## 2018-09-13 ENCOUNTER — Encounter: Payer: Self-pay | Admitting: Internal Medicine

## 2018-09-13 ENCOUNTER — Ambulatory Visit (INDEPENDENT_AMBULATORY_CARE_PROVIDER_SITE_OTHER): Payer: BC Managed Care – PPO | Admitting: Internal Medicine

## 2018-09-13 VITALS — BP 120/80 | HR 80 | Temp 98.0°F | Ht 70.5 in | Wt 218.0 lb

## 2018-09-13 DIAGNOSIS — Z23 Encounter for immunization: Secondary | ICD-10-CM | POA: Diagnosis not present

## 2018-09-13 DIAGNOSIS — M65331 Trigger finger, right middle finger: Secondary | ICD-10-CM | POA: Diagnosis not present

## 2018-09-13 DIAGNOSIS — E039 Hypothyroidism, unspecified: Secondary | ICD-10-CM | POA: Diagnosis not present

## 2018-09-13 DIAGNOSIS — R5383 Other fatigue: Secondary | ICD-10-CM

## 2018-09-13 LAB — TSH: TSH: 2.98 mIU/L (ref 0.40–4.50)

## 2018-09-13 NOTE — Patient Instructions (Addendum)
TSH checked for complaint of fatigue with further recommendations to follow.  Referral to Dr. Fredna Dow regarding right third trigger finger.  Flu vaccine given.

## 2018-09-13 NOTE — Progress Notes (Signed)
   Subjective:    Patient ID: Ricky Sharp, male    DOB: Jul 08, 1961, 57 y.o.   MRN: [id number]  HPI   Here with trigger finger right 3rd finger.  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  He has never had this before.  No history of injury.  No swelling or redness or infection.  He is complaining of fatigue.  Has situational stress which we discussed today.  He is on antidepressant therapy.  He takes thyroid replacement medication and TSH in June was normal.  Has not felt like exercising.  Review of Systems see above     Objective:   Physical Exam  Right third finger PIP joint sticking with flexion.      Assessment & Plan:  Right third trigger finger  Plan: Refer to Dr. Fredna Dow for evaluation  Fatigue-may be situational stress.  History of hypothyroidism.  Check TSH with further recommendations to follow.  TSH checked in June was normal

## 2018-09-14 ENCOUNTER — Ambulatory Visit: Payer: BC Managed Care – PPO | Admitting: Internal Medicine

## 2018-09-14 ENCOUNTER — Other Ambulatory Visit: Payer: Self-pay | Admitting: Internal Medicine

## 2018-09-14 MED ORDER — LEVOTHYROXINE SODIUM 100 MCG PO TABS: 100.0000 ug | ORAL_TABLET | Freq: Every day | ORAL | 1 refills | Status: DC

## 2018-10-08 ENCOUNTER — Other Ambulatory Visit: Payer: Self-pay | Admitting: Cardiovascular Disease

## 2018-10-21 ENCOUNTER — Other Ambulatory Visit: Payer: Self-pay | Admitting: Internal Medicine

## 2018-10-25 ENCOUNTER — Other Ambulatory Visit: Payer: BC Managed Care – PPO | Admitting: Internal Medicine

## 2018-10-25 DIAGNOSIS — E039 Hypothyroidism, unspecified: Secondary | ICD-10-CM

## 2018-10-25 LAB — TSH: TSH: 1.89 m[IU]/L (ref 0.40–4.50)

## 2018-11-01 ENCOUNTER — Encounter: Payer: Self-pay | Admitting: Internal Medicine

## 2018-11-01 ENCOUNTER — Ambulatory Visit: Payer: BC Managed Care – PPO | Admitting: Internal Medicine

## 2018-11-01 VITALS — BP 140/90 | HR 64 | Ht 70.5 in | Wt 221.0 lb

## 2018-11-01 DIAGNOSIS — E039 Hypothyroidism, unspecified: Secondary | ICD-10-CM

## 2018-11-01 DIAGNOSIS — I10 Essential (primary) hypertension: Secondary | ICD-10-CM

## 2018-11-01 MED ORDER — LEVOTHYROXINE SODIUM 112 MCG PO TABS: 112.0000 ug | ORAL_TABLET | Freq: Every day | ORAL | 3 refills | Status: DC

## 2018-11-01 NOTE — Progress Notes (Signed)
   Subjective:    Patient ID: Ricky Sharp, male    DOB: Oct 31, 1961, 57 y.o.   MRN: [id number]  HPI Patient has history of hypothyroidism.  He was on levothyroxine 0.05 mg daily for several years.  He was here in October for trigger finger.  Complained of fatigue.  We checked his TSH at that time and TSH was 2.98 where as it had been 1.635 months previously.  We increased dose of levothyroxine to 0.1 mg daily and he is now here for follow-up.  He does feel better.  His TSH is now 1.89.    Review of Systems see above no new complaints     Objective:   Physical Exam  No thyromegaly      Assessment & Plan:  Fatigue- improved  Hypothyroidism-TSH is 1.89.  We are going to increase it slightly more to 0.112 mg daily and follow-up in January with TSH along with lipid panel and 21-month recheck appointment.

## 2018-11-01 NOTE — Patient Instructions (Signed)
Increase levothyroxine to 0.112 mg daily and follow-up in January.

## 2018-11-14 ENCOUNTER — Ambulatory Visit: Payer: BC Managed Care – PPO | Admitting: Neurology

## 2018-11-14 ENCOUNTER — Encounter: Payer: Self-pay | Admitting: Neurology

## 2018-11-14 VITALS — BP 164/99 | HR 65 | Ht 70.5 in | Wt 220.0 lb

## 2018-11-14 DIAGNOSIS — G4733 Obstructive sleep apnea (adult) (pediatric): Secondary | ICD-10-CM | POA: Diagnosis not present

## 2018-11-14 DIAGNOSIS — M2619 Other specified anomalies of jaw-cranial base relationship: Secondary | ICD-10-CM | POA: Diagnosis not present

## 2018-11-14 NOTE — Progress Notes (Signed)
SLEEP MEDICINE CLINIC   Provider:  Larey Seat, M D  Referring Provider: Elby Showers, MD Primary Care Physician:  Elby Showers, MD   Chief complaint according to patient : "I am fatigued and sleepy "    RV on 11-14-2018,  Ricky Sharp is a 57 y.o. male - the Patient changed to dental device treatment with Dr. Augustina Mood.  He is doing well. Epworth 7 / 24, FSS not answered. He has also lost weight, he enjoys travelling more when not having a CPAP. He has less frequent sinusitis but that was related to lack of cleaning of the CPAP.   CPAP did work for apnea reduction but was a hassle. D/C CPAP.    HPI:  Ricky Sharp is a 57 y.o. male , seen here as a referral from Dr. Renold Genta for a sleep evaluation, Ricky Sharp reports that he has been a light sleeper most of his life. 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 Both parents in law have died in 2013-04-04 and then in 2014-04-04 and just this may his mother died. He has 2 kids of college age. He feels that he has been chronically fatigued unrelenting the fatigue to a disabling degree. This is partially reflected in today's questionnaires. He endorsed the fatigue severity score at 53 points and the Epworth sleepiness score at 9 points. Part of what made him seek a sleep specialist opinion was that his wife was evaluated and diagnosed with obstructive sleep apnea and is now on CPAP. In the process of  an internal medicine workup for fatigue Dr. Renold Genta also ordered multiple metabolic panels. The patient learned that he had a thyroid dysfunction.hypo-thyroidism.  He regularly takes a multivitamin but he was not asked to shore up on Vitamin D or B12 supplements. He was also not told if he is anemic or not. I reviewed the lab results from December, February and again from last August and his TSH   level has significantly come down. No anemia. He has frequent nasal allergies.   1) the severe fatigue that the patient reports was still an aftermath of the thyroid disease.   Interval history from 10-07-15.  Ricky Sharp is seen here today after his recent sleep study dated 09-24-15. His polysomnography revealed mild apnea 15 obstructive apneas were seen and 43 shallow breathing spells the AHI was 10.6 the RDI 10.6 the REM AHI was 30.6. Nadir was 87% oxygenation but for only 6 meet minutes. Based on the outcome of the sleep study I feel that CPAP could be helpful in treating all observed sleep disorders mild apnea, snoring, upper airway resistance he syndrome. But the patient also has retrognathia and may actually improved to a significant degree by using a dental device such as a mandibular advancement device. He has advised me of a TMJ click at the right jaw but he is not in pain. It may be possible to treat bruxism and snoring with this device and not aggravate the TM joint. Based on his degree of apnea can go either way,  CPAP or dental device. A dental device would be manufactured to the specification of the sleep lab in the treatment of a medical condition and therefore would run through his general health insurance and not through a dental insurance. Will order auto CPAP. 5-12 cm water. Mask  of patients choice.   History from 11-26-15. Ricky Sharp has numeric done very well with the CPAP. He is an 83% compliance for over 4 hours of daily use the average user time is 6 hours 42 minutes. He is using an AutoSet which allows pressure changes between 5 and 12 cm. EPR is set at 3 cm. The 91st percentile of pressure used at night came out at 7.5 cm water.  His AHI is 1.3 which is an excellent resolution of apnea which was at baseline only 10.6 but during REM sleep 30.6.  He has no significant air leaks and the pressure seems to be stable. He endorses today the Epworth sleepiness score at 9 but the  fatigue severity score  remains very high at 55 points. He feels at best a marginal improvement but he would engage at about 10%. It is not insomnia at night so the apnea is numerically treated but he remains excessively fatigued not so much sleepy. The question is if we can place him on a medication for fatigue. Aside from his sleep apnea there is no other sleep disorder that needs to be addressed. He has had a recent appointment with his primary care physician Dr. Dawna Part, and he and his primary care physician R looking further for reasons of significant fatigue. As it stands now the apnea is not the cause for the continued fatigue but it would be FDA approved to write modafinil for him to treat fatigue.  Interval history from 05/25/2016, Ricky Sharp presents with a 80% compliance for the last 30 days his average user time for CPAP on days used is 7 hours and 3 minutes and over all days 5 hours 40 minutes. His AHI is 1.2 and he has continued to use AutoSet, between 5 and 12 cm water with 3 cm EPR there is no major air leak noted at the apnea is very well controlled. His fatigue is a little decreased in comparison to a year ago 41 points on the fatigue severity score and 11 points on the Epworth sleepiness score.  Interval history 11-13-2017, I have the pleasure of seeing Ricky Sharp today who has been highly compliant CPAP patient, 80% compliance with an average time of 5 hours and 54 minutes of CPAP use, he is placed on an AutoSet between 5 and 12 cmH2O with 3 cm expiratory pressure relief.  His residual AHI is only 1.4/h, which is an excellent resolution.  Higher pressures are not indicated as central apneas emerged.  At the 95th percentile pressure is sleep 7.6 cm water. He has some skin reaction to the interface.   Review of Systems: Out of a complete 14 system review, the patient complains of only the following symptoms, and all other reviewed systems are negative.  Mild OSA,  Epworth  score 8 from 11  , Fatigue severity score still 41 from 55 ,  depression score n/a, anxiety - has side effects to all SSRI until low dose lexapro was used.    Social History   Socioeconomic History  . Marital status: Married    Spouse name: Not on file  . Number of children: Not on file  . Years of education: Not on file  . Highest education level: Not on file  Occupational History  . Occupation: Camera operator  . Financial resource strain: Not on file  . Food insecurity:    Worry: Not on file    Inability: Not on file  . Transportation  needs:    Medical: Not on file    Non-medical: Not on file  Tobacco Use  . Smoking status: Never Smoker  . Smokeless tobacco: Never Used  Substance and Sexual Activity  . Alcohol use: Yes    Alcohol/week: 2.0 standard drinks    Types: 2 Glasses of wine per week  . Drug use: No  . Sexual activity: Not on file  Lifestyle  . Physical activity:    Days per week: Not on file    Minutes per session: Not on file  . Stress: Not on file  Relationships  . Social connections:    Talks on phone: Not on file    Gets together: Not on file    Attends religious service: Not on file    Active member of club or organization: Not on file    Attends meetings of clubs or organizations: Not on file    Relationship status: Not on file  . Intimate partner violence:    Fear of current or ex partner: Not on file    Emotionally abused: Not on file    Physically abused: Not on file    Forced sexual activity: Not on file  Other Topics Concern  . Not on file  Social History Narrative  . Not on file    Family History  Adopted: Yes  Problem Relation Age of Onset  . Fibromyalgia Mother   . Syncope episode Mother   . Lung disease Mother     Past Medical History:  Diagnosis Date  . Eczema   . Recurrent upper respiratory infection (URI)     Past Surgical History:  Procedure Laterality Date  . CHOLECYSTECTOMY    . OSTEOCHONDROMA  EXCISION    . TONSILLECTOMY    . WISDOM TOOTH EXTRACTION      Current Outpatient Medications  Medication Sig Dispense Refill  . ALPRAZolam (XANAX) 0.25 MG tablet Take 1 tablet (0.25 mg total) by mouth 2 (two) times daily as needed for anxiety. 60 tablet 3  . Azelastine-Fluticasone (DYMISTA) 137-50 MCG/ACT SUSP USE 1-2 SPRAYS IN EACH NOSTRIL AT BEDTIME AS DIRECTED 23 g 5  . cromolyn (NASALCROM) 5.2 MG/ACT nasal spray Place 1 spray into the nose 4 (four) times daily.      Marland Kitchen dexlansoprazole (DEXILANT) 60 MG capsule Take 60 mg by mouth daily as needed.     . diltiazem (DILT-XR) 240 MG 24 hr capsule Take 1 capsule (240 mg total) by mouth daily. 90 capsule 1  . escitalopram (LEXAPRO) 10 MG tablet TAKE (1/2) TABLET DAILY. 45 tablet 3  . hydrochlorothiazide (HYDRODIURIL) 25 MG tablet TAKE 1 TABLET BY MOUTH DAILY. 90 tablet 3  . ibuprofen (ADVIL,MOTRIN) 200 MG tablet Take 200 mg by mouth every 6 (six) hours as needed.      Marland Kitchen levothyroxine (SYNTHROID, LEVOTHROID) 112 MCG tablet Take 1 tablet (112 mcg total) by mouth daily. 90 tablet 3  . modafinil (PROVIGIL) 200 MG tablet Take 1 tablet (200 mg total) by mouth daily. 30 tablet 5  . Multiple Vitamins-Minerals (CENTRUM SILVER) tablet Take 1 tablet by mouth daily.    Marland Kitchen olopatadine (PATANOL) 0.1 % ophthalmic solution Place 1 drop into both eyes 2 (two) times daily. As needed 5 mL prn  . ORACEA 40 MG capsule Take 40 mg by mouth daily as needed.     . potassium chloride (K-DUR) 10 MEQ tablet TAKE (2) TABLETS TWICE DAILY. 360 tablet 3  . rosuvastatin (CRESTOR) 5 MG tablet TAKE 1 TABLET ONCE  DAILY. 90 tablet 3   No current facility-administered medications for this visit.     Allergies as of 11/14/2018 - Review Complete 11/14/2018  Allergen Reaction Noted  . Lisinopril  01/01/2015  . Losartan  09/11/2014    Vitals: BP (!) 164/99   Pulse 65   Ht 5' 10.5" (1.791 m)   Wt 220 lb (99.8 kg)   BMI 31.12 kg/m    Last Weight:  Wt Readings from Last 1  Encounters:  11/14/18 220 lb (99.8 kg)   ZJQ:BHAL mass index is 31.12 kg/m.      Ht Readings from Last 1 Encounters:  11/14/18 5' 10.5" (1.791 m)    Physical exam:  General: The patient is awake, alert and appears not in acute distress. The patient is well groomed. Head: Normocephalic, atraumatic.  Neck is supple. Mallampati 3-  uvula is not visible even with use of tongue depression. neck circumference: 17.0" .   Nasal airflow unrestricted- TMJ is not evident. Retrognathia is seen- overbite high grade.  Cardiovascular:  Regular rate and rhythm, without murmurs or carotid bruit, and without distended neck veins. Respiratory: Lungs are clear to auscultation. Skin:  Without evidence of edema, or rash  BMI is lower 3 kg/m2 , patient's posture is erect .   Neurologic exam : The patient is awake and alert, oriented to place and time.   Speech is fluent, with mild  dysphonia .  Mood and affect are appropriate.  Cranial nerves:Reports loss of smell, but not of taste.  Pupils are equal and briskly reactive to light.   Hearing to finger rub intact. Facial sensation intact to fine touch.Facial motor strength is symmetric and tongue and uvula move midline. Shoulder shrug was symmetrical.    The patient was advised of the nature of the diagnosed sleep disorder , the treatment options and risks for general a health and wellness arising from not treating the condition.  I spent more than 15 minutes of face to face time with the patient. He is using a dental device , replacing the CPAP.   Greater than 50% of time was spent in counseling and coordination of care. We have discussed the diagnosis and differential and I answered the patient's questions.  His hypersomnia has improved with Thyroid medication.   I will order Modafinil after review of clinical symptoms and CPAP download form AUTOPAP.   Assessment:  After physical and neurologic examination, review of laboratory studies,  Personal  review of imaging studies, reports of other /same  Imaging studies ,  Results of polysomnography/ neurophysiology testing and pre-existing records as far as provided in visit., my assessment is   Mild apnea , snoring and UARS, retrognathia. Ricky Sharp is a 57 y.o. male - the Patient changed to dental device treatment with Dr. Augustina Mood.  He is doing well. Epworth 7 / 24, FSS not answered. He has also lost weight, he enjoys travelling more when not having a CPAP. He has less frequent sinusitis but that was related to lack of cleaning of the CPAP.   CPAP did work for apnea reduction but was a hassle. D/C CPAP.  I will be happy to provide a confirmatory sleep study, if needed with titration to dental device.  Dr Toy Cookey may want to perform a HST first.   Patient uses now a dental device, has sometime jaw soreness, no clicking. He sleeps well, not longer on CPAP.  Modafinil prn, not needing refills.      Rv prn,  Asencion Partridge Misty Rago MD  11/14/2018   CC: Elby Showers, Md [address] [place], [place] [id number]

## 2018-11-14 NOTE — Patient Instructions (Signed)
Off CPAP, using a dental device.

## 2018-11-20 ENCOUNTER — Other Ambulatory Visit: Payer: BC Managed Care – PPO | Admitting: Internal Medicine

## 2018-11-23 ENCOUNTER — Ambulatory Visit: Payer: BC Managed Care – PPO | Admitting: Internal Medicine

## 2018-12-04 ENCOUNTER — Other Ambulatory Visit: Payer: Self-pay | Admitting: Cardiovascular Disease

## 2018-12-20 ENCOUNTER — Other Ambulatory Visit: Payer: BC Managed Care – PPO | Admitting: Internal Medicine

## 2018-12-20 DIAGNOSIS — E039 Hypothyroidism, unspecified: Secondary | ICD-10-CM

## 2018-12-20 DIAGNOSIS — E781 Pure hyperglyceridemia: Secondary | ICD-10-CM

## 2018-12-20 LAB — LIPID PANEL
CHOLESTEROL: 122 mg/dL (ref <200)
HDL: 27 mg/dL — AB (ref >40)
LDL CHOLESTEROL (CALC): 68 mg/dL
Non-HDL Cholesterol (Calc): 95 mg/dL (calc) (ref <130)
Total CHOL/HDL Ratio: 4.5 (calc) (ref <5.0)
Triglycerides: 203 mg/dL — ABNORMAL HIGH (ref <150)

## 2018-12-20 LAB — TSH: TSH: 1 mIU/L (ref 0.40–4.50)

## 2018-12-24 ENCOUNTER — Ambulatory Visit (INDEPENDENT_AMBULATORY_CARE_PROVIDER_SITE_OTHER): Payer: BC Managed Care – PPO | Admitting: Internal Medicine

## 2018-12-24 ENCOUNTER — Encounter: Payer: Self-pay | Admitting: Internal Medicine

## 2018-12-24 VITALS — BP 140/80 | HR 65 | Ht 70.5 in | Wt 220.0 lb

## 2018-12-24 DIAGNOSIS — M65331 Trigger finger, right middle finger: Secondary | ICD-10-CM | POA: Diagnosis not present

## 2018-12-24 DIAGNOSIS — E781 Pure hyperglyceridemia: Secondary | ICD-10-CM

## 2018-12-24 DIAGNOSIS — I10 Essential (primary) hypertension: Secondary | ICD-10-CM

## 2018-12-24 DIAGNOSIS — F439 Reaction to severe stress, unspecified: Secondary | ICD-10-CM

## 2018-12-24 DIAGNOSIS — E039 Hypothyroidism, unspecified: Secondary | ICD-10-CM | POA: Diagnosis not present

## 2018-12-24 DIAGNOSIS — R5383 Other fatigue: Secondary | ICD-10-CM | POA: Diagnosis not present

## 2018-12-24 NOTE — Progress Notes (Signed)
   Subjective:    Patient ID: Ricky Sharp, male    DOB: 07-31-1961, 58 y.o.   MRN: [id number]  HPI At last visit in November, we adjusted thyroid replacement medication and increased levothyroxine to 0.112 mg daily trying to get his TSH around 1.00 as it was 1.89 in November on levothyroxine 0.1 mg daily.  He says he feels a bit better and has a little more energy although has significant situational stress.  This was discussed.  He is also here to follow-up on hypertension and hyperlipidemia.  Blood pressure is 140/80.  Weight is 220 pounds.  Needs to lose 20 pounds.  Saw Dr. Fredna Dow for stenosing tenosynovitis right middle finger in October.  This was injected with Celestone and Xylocaine and improved.  Unfortunately triglycerides have increased from 185 in June to 203.  Total cholesterol and LDL cholesterol are normal.  TSH is excellent at 1.00.   He was started on Crestor 5 mg daily in October.  Unfortunately triglycerides have increased from 1 85-2 03.  Total cholesterol is 122 and LDL cholesterol is 68.  He Ricky work on diet and exercise.   Review of Systems see above     Objective:   Physical Exam Neck supple.  No thyromegaly.  Chest clear.  Cardiac exam regular rate and rhythm.  Extremities without edema.       Assessment & Plan:  Hypothyroidism-stable and improved clinically with increased dose of thyroid replacement.  Hypertriglyceridemia-needs to work on diet and exercise.  Situational stress-discussed  History of asthma-sees allergist  Essential hypertension-stable on current regimen  History of sleep apnea  Plan: Continue current regimen.  Encourage diet exercise and weight loss.  Follow-up at physical exam in June.

## 2018-12-27 ENCOUNTER — Ambulatory Visit: Payer: BC Managed Care – PPO | Admitting: Internal Medicine

## 2018-12-31 ENCOUNTER — Other Ambulatory Visit: Payer: Self-pay | Admitting: Allergy and Immunology

## 2018-12-31 DIAGNOSIS — J302 Other seasonal allergic rhinitis: Secondary | ICD-10-CM

## 2018-12-31 DIAGNOSIS — J3089 Other allergic rhinitis: Secondary | ICD-10-CM

## 2019-01-03 ENCOUNTER — Ambulatory Visit: Payer: BC Managed Care – PPO | Admitting: Allergy

## 2019-01-03 ENCOUNTER — Encounter: Payer: Self-pay | Admitting: Allergy

## 2019-01-03 VITALS — BP 132/78 | HR 72 | Resp 18 | Ht 71.0 in | Wt 220.0 lb

## 2019-01-03 DIAGNOSIS — J302 Other seasonal allergic rhinitis: Secondary | ICD-10-CM | POA: Diagnosis not present

## 2019-01-03 DIAGNOSIS — J3089 Other allergic rhinitis: Secondary | ICD-10-CM | POA: Diagnosis not present

## 2019-01-03 DIAGNOSIS — J4520 Mild intermittent asthma, uncomplicated: Secondary | ICD-10-CM | POA: Diagnosis not present

## 2019-01-03 MED ORDER — OLOPATADINE HCL 0.7 % OP SOLN: 1.0000 [drp] | Freq: Every day | OPHTHALMIC | 5 refills | Status: DC | PRN

## 2019-01-03 MED ORDER — FLUTICASONE PROPIONATE 93 MCG/ACT NA EXHU: 2.0000 | INHALANT_SUSPENSION | Freq: Two times a day (BID) | NASAL | 5 refills | Status: DC

## 2019-01-03 NOTE — Patient Instructions (Addendum)
Seasonal and perennial allergic rhinitis  Start Xhance 2 spray twice a day. Sample given and demonstrated proper use - this replaced dymista, flonase and nasalcrom.  Nasal saline spray (i.e., Simply Saline) or nasal saline lavage (i.e., NeilMed) is recommended as needed and prior to medicated nasal sprays.  May use pazeo eye drop 1 in each eye daily as needed.  Go to ENT as scheduled.  Follow up in 3 months

## 2019-01-03 NOTE — Assessment & Plan Note (Signed)
No breathing issues since the last visit. No albuterol use.  Monitor symptoms.

## 2019-01-03 NOTE — Assessment & Plan Note (Signed)
Patient used to be on allergy immunotherapy in the past and did not notice any worsening symptoms since cessation. However, he still has nasal congestion, rhinorrhea and itchy eyes.   Start Xhance 2 spray twice a day. Sample given and demonstrated proper use - this replaces dymista, Flonase and Nasalcrom.  Nasal saline spray (i.e., Simply Saline) or nasal saline lavage (i.e., NeilMed) is recommended as needed and prior to medicated nasal sprays.  May use pazeo eye drop 1 in each eye daily as needed.  Go to ENT as scheduled.  If above regimen does not control symptoms then will consider retesting and restarting allergy immunotherapy.

## 2019-01-03 NOTE — Progress Notes (Signed)
Follow Up Note  RE: Ricky Sharp MRN: [id number] DOB: [birth year] Date of Office Visit: 01/03/2019  Referring provider: Elby Showers, MD Primary care provider: Elby Showers, MD  Chief Complaint: Allergic Rhinitis  (doing pretty fair. some itchy, watery eyes. some sinus congestion. needs refills. )  History of Present Illness: I had the pleasure of seeing Ricky Sharp for a follow up visit at the [facility] [place] on 01/03/2019. He is a 58 y.o. male, who is being followed for allergic rhinitis. Today he is here for regular follow up visit. His previous allergy office visit was on 03/20/2017 with Dr. Verlin Fester.   Seasonal and perennial allergic rhinitis Patient still has some nasal congestion, rhinorrhea, and diminished sense of smell.   Only using dymista as needed with some benefit and uses Flonase or Nasalcrom 1 spray once a day. Some nosebleeds during the winter time. He does have humidifiers in the home.   Patient stopped allergy injections in 2018 and did not notice any worsening symptoms. Used to self inject at home. He usually gets 1-2 sinus infections.   Eye symptoms are slightly worse and using alaway with some benefit.   Denies any SOB, coughing, wheezing, chest tightness, nocturnal awakenings, ER/urgent care visits or prednisone use since the last visit.  Assessment and Plan: Manav is a 58 y.o. male with: Seasonal and perennial allergic rhinitis Patient used to be on allergy immunotherapy in the past and did not notice any worsening symptoms since cessation. However, he still has nasal congestion, rhinorrhea and itchy eyes.   Start Xhance 2 spray twice a day. Sample given and demonstrated proper use - this replaces dymista, Flonase and Nasalcrom.  Nasal saline spray (i.e., Simply Saline) or nasal saline lavage (i.e., NeilMed) is recommended as needed and prior to medicated nasal sprays.  May use pazeo eye drop 1 in each eye daily as needed.  Go to ENT  as scheduled.  If above regimen does not control symptoms then will consider retesting and restarting allergy immunotherapy.   Mild intermittent asthma without complication No breathing issues since the last visit. No albuterol use.  Monitor symptoms.   Return in about 3 months (around 04/04/2019).  Meds ordered this encounter  Medications  . Olopatadine HCl (PAZEO) 0.7 % SOLN    Sig: Place 1 drop into both eyes daily as needed (itchy eyes).    Dispense:  1 Bottle    Refill:  5  . Fluticasone Propionate (XHANCE) 93 MCG/ACT EXHU    Sig: Place 2 sprays into both nostrils 2 (two) times daily.    Dispense:  32 mL    Refill:  5   Diagnostics: None.  Medication List:  Current Outpatient Medications  Medication Sig Dispense Refill  . ALPRAZolam (XANAX) 0.25 MG tablet Take 1 tablet (0.25 mg total) by mouth 2 (two) times daily as needed for anxiety. 60 tablet 3  . Azelastine-Fluticasone (DYMISTA) 137-50 MCG/ACT SUSP USE 1-2 SPRAYS IN EACH NOSTRIL AT BEDTIME AS DIRECTED 23 g 5  . cromolyn (NASALCROM) 5.2 MG/ACT nasal spray Place 1 spray into the nose 4 (four) times daily.      Marland Kitchen dexlansoprazole (DEXILANT) 60 MG capsule Take 60 mg by mouth daily as needed.     Marland Kitchen DILT-XR 240 MG 24 hr capsule TAKE (1) CAPSULE DAILY. 90 capsule 2  . escitalopram (LEXAPRO) 10 MG tablet TAKE (1/2) TABLET DAILY. 45 tablet 3  . hydrochlorothiazide (HYDRODIURIL) 25 MG tablet TAKE 1 TABLET BY  MOUTH DAILY. 90 tablet 3  . ibuprofen (ADVIL,MOTRIN) 200 MG tablet Take 200 mg by mouth every 6 (six) hours as needed.      Marland Kitchen levothyroxine (SYNTHROID, LEVOTHROID) 112 MCG tablet Take 1 tablet (112 mcg total) by mouth daily. 90 tablet 3  . Multiple Vitamins-Minerals (CENTRUM SILVER) tablet Take 1 tablet by mouth daily.    . ORACEA 40 MG capsule Take 40 mg by mouth daily as needed.     . potassium chloride (K-DUR) 10 MEQ tablet TAKE (2) TABLETS TWICE DAILY. 360 tablet 3  . rosuvastatin (CRESTOR) 5 MG tablet TAKE 1 TABLET ONCE  DAILY. 90 tablet 3  . Fluticasone Propionate (XHANCE) 93 MCG/ACT EXHU Place 2 sprays into both nostrils 2 (two) times daily. 32 mL 5  . Olopatadine HCl (PAZEO) 0.7 % SOLN Place 1 drop into both eyes daily as needed (itchy eyes). 1 Bottle 5   No current facility-administered medications for this visit.    Allergies: Allergies  Allergen Reactions  . Lisinopril     diarrhea  . Losartan     Fatigue, diarrhea   I reviewed his past medical history, social history, family history, and environmental history and no significant changes have been reported from previous visit on 03/20/2017.  Review of Systems  Constitutional: Negative for appetite change, chills, fever and unexpected weight change.  HENT: Positive for congestion. Negative for rhinorrhea.   Eyes: Negative for itching.  Respiratory: Negative for cough, chest tightness, shortness of breath and wheezing.   Gastrointestinal: Negative for abdominal pain.  Skin: Negative for rash.  Allergic/Immunologic: Positive for environmental allergies.  Neurological: Negative for headaches.   Objective: BP 132/78 (BP Location: Left Arm, Patient Position: Sitting, Cuff Size: Normal)   Pulse 72   Resp 18   Ht 5\' 11"  (1.803 m)   Wt 220 lb (99.8 kg)   SpO2 97%   BMI 30.68 kg/m  Body mass index is 30.68 kg/m. Physical Exam  Constitutional: He is oriented to person, place, and time. He appears well-developed and well-nourished.  HENT:  Head: Normocephalic and atraumatic.  Right Ear: External ear normal.  Left Ear: External ear normal.  Mouth/Throat: Oropharynx is clear and moist.  Dried nasal discharge b/l  Eyes: Conjunctivae and EOM are normal.  Neck: Neck supple.  Cardiovascular: Normal rate, regular rhythm and normal heart sounds. Exam reveals no gallop and no friction rub.  No murmur heard. Pulmonary/Chest: Effort normal and breath sounds normal. He has no wheezes. He has no rales.  Lymphadenopathy:    He has no cervical adenopathy.    Neurological: He is alert and oriented to person, place, and time.  Skin: Skin is warm. No rash noted.  Psychiatric: He has a normal mood and affect. His behavior is normal.  Nursing note and vitals reviewed.  Previous notes and tests were reviewed. The plan was reviewed with the patient/family, and all questions/concerned were addressed.  It was my pleasure to see Ricky Sharp today and participate in his care. Please feel free to contact me with any questions or concerns.  Sincerely,  Rexene Alberts, DO Allergy & Immunology  [facility] office: [phone] [facility] office: [phone]

## 2019-01-23 ENCOUNTER — Encounter: Payer: Self-pay | Admitting: Internal Medicine

## 2019-01-23 DIAGNOSIS — E781 Pure hyperglyceridemia: Secondary | ICD-10-CM | POA: Insufficient documentation

## 2019-01-23 DIAGNOSIS — E785 Hyperlipidemia, unspecified: Secondary | ICD-10-CM | POA: Insufficient documentation

## 2019-01-23 NOTE — Patient Instructions (Signed)
It was a pleasure to see you today.  Please continue current medications at current dosages and follow-up in June for physical examination.  Work on diet exercise and weight loss.

## 2019-01-29 ENCOUNTER — Other Ambulatory Visit: Payer: Self-pay | Admitting: Dermatology

## 2019-03-01 ENCOUNTER — Encounter: Payer: Self-pay | Admitting: Internal Medicine

## 2019-03-01 ENCOUNTER — Ambulatory Visit (INDEPENDENT_AMBULATORY_CARE_PROVIDER_SITE_OTHER): Payer: BC Managed Care – PPO | Admitting: Internal Medicine

## 2019-03-01 ENCOUNTER — Other Ambulatory Visit: Payer: Self-pay

## 2019-03-01 VITALS — BP 130/90 | HR 87 | Temp 99.7°F | Ht 71.0 in

## 2019-03-01 DIAGNOSIS — R350 Frequency of micturition: Secondary | ICD-10-CM | POA: Diagnosis not present

## 2019-03-01 DIAGNOSIS — R300 Dysuria: Secondary | ICD-10-CM

## 2019-03-01 DIAGNOSIS — R509 Fever, unspecified: Secondary | ICD-10-CM | POA: Diagnosis not present

## 2019-03-01 DIAGNOSIS — R8290 Unspecified abnormal findings in urine: Secondary | ICD-10-CM

## 2019-03-01 DIAGNOSIS — N390 Urinary tract infection, site not specified: Secondary | ICD-10-CM

## 2019-03-01 DIAGNOSIS — N410 Acute prostatitis: Secondary | ICD-10-CM

## 2019-03-01 LAB — CBC WITH DIFFERENTIAL/PLATELET
Absolute Monocytes: 1208 cells/uL — ABNORMAL HIGH (ref 200–950)
Basophils Absolute: 64 cells/uL (ref 0–200)
Basophils Relative: 0.4 %
EOS PCT: 0.6 %
Eosinophils Absolute: 95 cells/uL (ref 15–500)
HCT: 49.2 % (ref 38.5–50.0)
Hemoglobin: 17.2 g/dL — ABNORMAL HIGH (ref 13.2–17.1)
Lymphs Abs: 1908 cells/uL (ref 850–3900)
MCH: 30.4 pg (ref 27.0–33.0)
MCHC: 35 g/dL (ref 32.0–36.0)
MCV: 86.9 fL (ref 80.0–100.0)
MPV: 10.4 fL (ref 7.5–12.5)
Monocytes Relative: 7.6 %
NEUTROS PCT: 79.4 %
Neutro Abs: 12625 cells/uL — ABNORMAL HIGH (ref 1500–7800)
Platelets: 272 10*3/uL (ref 140–400)
RBC: 5.66 10*6/uL (ref 4.20–5.80)
RDW: 13.6 % (ref 11.0–15.0)
Total Lymphocyte: 12 %
WBC: 15.9 10*3/uL — ABNORMAL HIGH (ref 3.8–10.8)

## 2019-03-01 LAB — POCT URINALYSIS DIPSTICK
Appearance: ABNORMAL
BILIRUBIN UA: NEGATIVE
Glucose, UA: POSITIVE — AB
Ketones, UA: NEGATIVE
Nitrite, UA: POSITIVE
Odor: ABNORMAL
Protein, UA: POSITIVE — AB
Spec Grav, UA: 1.015 (ref 1.010–1.025)
Urobilinogen, UA: 0.2 E.U./dL
pH, UA: 6 (ref 5.0–8.0)

## 2019-03-01 MED ORDER — CEFTRIAXONE SODIUM 1 G IJ SOLR
1.0000 g | Freq: Once | INTRAMUSCULAR | Status: AC
  Administered 2019-03-01: 1 g via INTRAMUSCULAR

## 2019-03-01 MED ORDER — ALPRAZOLAM 0.25 MG PO TABS: 0.2500 mg | ORAL_TABLET | Freq: Two times a day (BID) | ORAL | 3 refills | Status: DC | PRN

## 2019-03-01 MED ORDER — PHENAZOPYRIDINE HCL 200 MG PO TABS: 200.0000 mg | ORAL_TABLET | Freq: Three times a day (TID) | ORAL | 0 refills | Status: DC | PRN

## 2019-03-01 MED ORDER — CIPROFLOXACIN HCL 500 MG PO TABS: 500.0000 mg | ORAL_TABLET | Freq: Two times a day (BID) | ORAL | 0 refills | Status: DC

## 2019-03-01 NOTE — Progress Notes (Signed)
   Subjective:    Patient ID: Ricky Sharp, male    DOB: June 02, 1961, 58 y.o.   MRN: [id number]  HPI 58 year old Male in with complaint of urinary frequency and urgency.  No fever or shaking chills.  Onset was fairly abrupt this morning.  No prior history of prostatitis or urinary tract infection symptoms.  No blood in urine.  No prior history of kidney stones.  Recently placed on clindamycin for sinus infection by ENT physician in a tapering course of prednisone    Review of Systems no nausea vomiting fever or chills     Objective:   Physical Exam  Temperature 99.7 degrees.  Blood pressure 130/90.  No CVA tenderness.  Urine dipstick shows large occult blood and 3+ leukocytes.  Culture was sent.      Assessment & Plan:  Acute urinary tract infection  Acute prostatitis  Plan: CBC with differential shows white blood cell count 15,900.  Given 1 g IM Rocephin.  Started on Cipro 500 mg twice daily for 3 weeks with follow-up at that time.  Addendum: Urine culture grew E. coli sensitive to Cipro.  Addendum: March 21,2020- I texted patient and he is feeling better.

## 2019-03-02 ENCOUNTER — Telehealth: Payer: Self-pay | Admitting: Internal Medicine

## 2019-03-02 ENCOUNTER — Encounter: Payer: Self-pay | Admitting: Internal Medicine

## 2019-03-02 NOTE — Telephone Encounter (Signed)
WBC  From yesterday is elevated. Texted pt . Feeling better today

## 2019-03-03 LAB — URINE CULTURE
MICRO NUMBER:: 341016
SPECIMEN QUALITY:: ADEQUATE

## 2019-03-09 NOTE — Patient Instructions (Signed)
1 g IM Rocephin given.  CBC with differential drawn and pending.  Cipro 500 mg twice daily for 3 weeks with follow-up in 3 weeks.  Urine culture pending.

## 2019-03-14 ENCOUNTER — Encounter: Payer: Self-pay | Admitting: Internal Medicine

## 2019-03-14 ENCOUNTER — Other Ambulatory Visit: Payer: Self-pay

## 2019-03-14 ENCOUNTER — Ambulatory Visit (INDEPENDENT_AMBULATORY_CARE_PROVIDER_SITE_OTHER): Payer: BC Managed Care – PPO | Admitting: Internal Medicine

## 2019-03-14 VITALS — BP 130/80 | HR 60 | Temp 98.4°F | Ht 71.0 in | Wt 220.0 lb

## 2019-03-14 DIAGNOSIS — R8290 Unspecified abnormal findings in urine: Secondary | ICD-10-CM

## 2019-03-14 DIAGNOSIS — N390 Urinary tract infection, site not specified: Secondary | ICD-10-CM

## 2019-03-14 LAB — POCT URINALYSIS DIPSTICK
Appearance: NEGATIVE
Bilirubin, UA: NEGATIVE
Blood, UA: NEGATIVE
Glucose, UA: POSITIVE — AB
Ketones, UA: NEGATIVE
Leukocytes, UA: NEGATIVE
Nitrite, UA: NEGATIVE
Odor: NEGATIVE
Protein, UA: NEGATIVE
Spec Grav, UA: 1.015 (ref 1.010–1.025)
Urobilinogen, UA: 0.2 E.U./dL
pH, UA: 6.5 (ref 5.0–8.0)

## 2019-03-14 NOTE — Patient Instructions (Signed)
Status post treatment with Cipro for 10 days.  No evidence of urinary tract infection.  Return as needed.

## 2019-03-14 NOTE — Progress Notes (Signed)
Nurse visit. Pt diagnosed with E. Coli UTI March 20. Has completed 10 day course of Cipro. Feeling better and urine specimen normal except for glucosuria. No symtoms of DM. Normal glucose at last PE.

## 2019-04-04 ENCOUNTER — Ambulatory Visit: Payer: BC Managed Care – PPO | Admitting: Allergy

## 2019-05-10 ENCOUNTER — Other Ambulatory Visit: Payer: Self-pay | Admitting: Allergy

## 2019-05-22 ENCOUNTER — Ambulatory Visit: Payer: BC Managed Care – PPO | Admitting: Allergy

## 2019-05-22 NOTE — Progress Notes (Deleted)
Follow Up Note  RE: Ricky Sharp MRN: [id number] DOB: 1961-02-19 Date of Office Visit: 05/22/2019  Referring provider: Elby Showers, MD Primary care provider: Elby Showers, MD  Chief Complaint: No chief complaint on file.  History of Present Illness: I had the pleasure of seeing Ricky Sharp for a follow up visit at the [facility] [place] [place] on 05/22/2019. He is a 58 y.o. male, who is being followed for allergic rhinitis, asthma. Today he is here for regular follow up visit. *** new complaint of ***. He is accompanied today by his *** who provided/contributed to the history. His previous allergy office visit was on 01/03/2019 with Dr. Maudie Mercury.   Seasonal and perennial allergic rhinitis Patient used to be on allergy immunotherapy in the past and did not notice any worsening symptoms since cessation. However, he still has nasal congestion, rhinorrhea and itchy eyes.   Start Xhance 2 spray twice a day. Sample given and demonstrated proper use - this replaces dymista, Flonase and Nasalcrom.  Nasal saline spray (i.e., Simply Saline) or nasal saline lavage (i.e., NeilMed) is recommended as needed and prior to medicated nasal sprays.  May use pazeo eye drop 1 in each eye daily as needed.  Go to ENT as scheduled.  If above regimen does not control symptoms then will consider retesting and restarting allergy immunotherapy.   Mild intermittent asthma without complication No breathing issues since the last visit. No albuterol use.  Monitor symptoms.   Assessment and Plan: Ricky Sharp is a 58 y.o. male with: No problem-specific Assessment & Plan notes found for this encounter.  No follow-ups on file.  No orders of the defined types were placed in this encounter.  Lab Orders  No laboratory test(s) ordered today    Diagnostics: Spirometry:  Tracings reviewed. His effort: {Blank single:19197::"Good reproducible efforts.","It was hard to get consistent efforts and there is a  question as to whether this reflects a maximal maneuver.","Poor effort, data can not be interpreted."} FVC: ***L FEV1: ***L, ***% predicted FEV1/FVC ratio: ***% Interpretation: {Blank single:19197::"Spirometry consistent with mild obstructive disease","Spirometry consistent with moderate obstructive disease","Spirometry consistent with severe obstructive disease","Spirometry consistent with possible restrictive disease","Spirometry consistent with mixed obstructive and restrictive disease","Spirometry uninterpretable due to technique","Spirometry consistent with normal pattern","No overt abnormalities noted given today's efforts"}.  Please see scanned spirometry results for details.  Skin Testing: {Blank single:19197::"Select foods","Environmental allergy panel","Environmental allergy panel and select foods","Food allergy panel","None","Deferred due to recent antihistamines use"}. Positive test to: ***. Negative test to: ***.  Results discussed with patient/family.   Medication List:  Current Outpatient Medications  Medication Sig Dispense Refill  . ALPRAZolam (XANAX) 0.25 MG tablet Take 1 tablet (0.25 mg total) by mouth 2 (two) times daily as needed for anxiety. 60 tablet 3  . Azelastine-Fluticasone (DYMISTA) 137-50 MCG/ACT SUSP USE 1-2 SPRAYS IN EACH NOSTRIL AT BEDTIME AS DIRECTED 23 g 5  . ciprofloxacin (CIPRO) 500 MG tablet Take 1 tablet (500 mg total) by mouth 2 (two) times daily. 42 tablet 0  . cromolyn (NASALCROM) 5.2 MG/ACT nasal spray Place 1 spray into the nose 4 (four) times daily.      Marland Kitchen dexlansoprazole (DEXILANT) 60 MG capsule Take 60 mg by mouth daily as needed.     Marland Kitchen DILT-XR 240 MG 24 hr capsule TAKE (1) CAPSULE DAILY. 90 capsule 2  . escitalopram (LEXAPRO) 10 MG tablet TAKE (1/2) TABLET DAILY. 45 tablet 3  . Fluticasone Propionate (XHANCE) 93 MCG/ACT EXHU Place 2 sprays into both  nostrils 2 (two) times daily. 32 mL 5  . hydrochlorothiazide (HYDRODIURIL) 25 MG tablet TAKE 1  TABLET BY MOUTH DAILY. 90 tablet 3  . ibuprofen (ADVIL,MOTRIN) 200 MG tablet Take 200 mg by mouth every 6 (six) hours as needed.      Marland Kitchen levothyroxine (SYNTHROID, LEVOTHROID) 112 MCG tablet Take 1 tablet (112 mcg total) by mouth daily. 90 tablet 3  . Multiple Vitamins-Minerals (CENTRUM SILVER) tablet Take 1 tablet by mouth daily.    . Olopatadine HCl (PAZEO) 0.7 % SOLN Place 1 drop into both eyes daily as needed (itchy eyes). 1 Bottle 5  . ORACEA 40 MG capsule Take 40 mg by mouth daily as needed.     . phenazopyridine (PYRIDIUM) 200 MG tablet Take 1 tablet (200 mg total) by mouth 3 (three) times daily as needed for pain. 6 tablet 0  . potassium chloride (K-DUR) 10 MEQ tablet TAKE (2) TABLETS TWICE DAILY. 360 tablet 3  . rosuvastatin (CRESTOR) 5 MG tablet TAKE 1 TABLET ONCE DAILY. 90 tablet 3   No current facility-administered medications for this visit.    Allergies: Allergies  Allergen Reactions  . Lisinopril     diarrhea  . Losartan     Fatigue, diarrhea   I reviewed his past medical history, social history, family history, and environmental history and no significant changes have been reported from previous visit on 01/03/2019.  Review of Systems  Constitutional: Negative for appetite change, chills, fever and unexpected weight change.  HENT: Positive for congestion. Negative for rhinorrhea.   Eyes: Negative for itching.  Respiratory: Negative for cough, chest tightness, shortness of breath and wheezing.   Gastrointestinal: Negative for abdominal pain.  Skin: Negative for rash.  Allergic/Immunologic: Positive for environmental allergies.  Neurological: Negative for headaches.   Objective: There were no vitals taken for this visit. There is no height or weight on file to calculate BMI. Physical Exam  Constitutional: He is oriented to person, place, and time. He appears well-developed and well-nourished.  HENT:  Head: Normocephalic and atraumatic.  Right Ear: External ear  normal.  Left Ear: External ear normal.  Mouth/Throat: Oropharynx is clear and moist.  Dried nasal discharge b/l  Eyes: Conjunctivae and EOM are normal.  Neck: Neck supple.  Cardiovascular: Normal rate, regular rhythm and normal heart sounds. Exam reveals no gallop and no friction rub.  No murmur heard. Pulmonary/Chest: Effort normal and breath sounds normal. He has no wheezes. He has no rales.  Neurological: He is alert and oriented to person, place, and time.  Skin: Skin is warm. No rash noted.  Psychiatric: He has a normal mood and affect. His behavior is normal.  Nursing note and vitals reviewed.  Previous notes and tests were reviewed. The plan was reviewed with the patient/family, and all questions/concerned were addressed.  It was my pleasure to see Ricky Sharp today and participate in his care. Please feel free to contact me with any questions or concerns.  Sincerely,  Rexene Alberts, DO Allergy & Immunology  [facility] office: [phone] [facility] office: [phone]

## 2019-05-23 ENCOUNTER — Other Ambulatory Visit: Payer: Self-pay

## 2019-05-23 ENCOUNTER — Encounter: Payer: Self-pay | Admitting: Allergy

## 2019-05-23 ENCOUNTER — Ambulatory Visit: Payer: BC Managed Care – PPO | Admitting: Allergy

## 2019-05-23 VITALS — BP 152/80 | HR 68 | Temp 97.9°F | Resp 18 | Ht 70.5 in | Wt 223.4 lb

## 2019-05-23 DIAGNOSIS — J4520 Mild intermittent asthma, uncomplicated: Secondary | ICD-10-CM

## 2019-05-23 DIAGNOSIS — J302 Other seasonal allergic rhinitis: Secondary | ICD-10-CM

## 2019-05-23 DIAGNOSIS — J339 Nasal polyp, unspecified: Secondary | ICD-10-CM | POA: Insufficient documentation

## 2019-05-23 DIAGNOSIS — H1010 Acute atopic conjunctivitis, unspecified eye: Secondary | ICD-10-CM

## 2019-05-23 DIAGNOSIS — J3089 Other allergic rhinitis: Secondary | ICD-10-CM

## 2019-05-23 NOTE — Assessment & Plan Note (Addendum)
Well-controlled. No albuterol use.  Monitor symptoms.   May use albuterol rescue inhaler 2 puffs or nebulizer every 4 to 6 hours as needed for shortness of breath, chest tightness, coughing, and wheezing. May use albuterol rescue inhaler 2 puffs 5 to 15 minutes prior to strenuous physical activities. Monitor frequency of use.

## 2019-05-23 NOTE — Progress Notes (Signed)
Follow Up Note  RE: GRAYSYN BACHE MRN: [id number] DOB: March 18, 1961 Date of Office Visit: 05/23/2019  Referring provider: Elby Showers, MD Primary care provider: Elby Showers, MD  Chief Complaint: Follow-up  History of Present Illness: I had the pleasure of seeing Rollan Roger for a follow up visit at the [facility] on 05/23/2019. He is a 58 y.o. male, who is being followed for allergic rhinitis, asthma. Today he is here for regular follow up visit. His previous allergy office visit was on 01/03/2019 with Dr. Maudie Mercury.   Seasonal and perennial allergic rhinitis Currently on Xhance 2 sprays twice a day which seems to be helping better than the other nasal sprays he tried. No nosebleeds.  Using pazeo eye drops as needed with good benefit.  ENT evaluation on 02/05/2019 - right large nasal polyp. Recommended surgery but insurance co-pay too high and symptoms have been manageable with medications.   Sinus CT demonstrated small polyp just medial to the right middle turbinate. There was chronic mucosal thickening in the frontal, maxillary, ethmoid sinuses. Small retention cyst right sphenoid sinus.  Mild intermittent asthma without complication Denies any SOB, coughing, wheezing, chest tightness, nocturnal awakenings, ER/urgent care visits or prednisone use since the last visit.  Assessment and Plan: Bakari is a 58 y.o. male with: Seasonal and perennial allergic rhinoconjunctivitis Past history - Patient used to be on allergy immunotherapy in the past and did not notice any worsening symptoms since cessation. However, he still has nasal congestion, rhinorrhea and itchy eyes.  Interim history - symptoms stable with below regimen. ENT evaluation found right nasal polyp - no surgery yet.    Continue Xhance 2 sprays twice a day.   Nasal saline spray (i.e., Simply Saline) or nasal saline lavage (i.e., NeilMed) is recommended as needed and prior to medicated nasal sprays.   May use pazeo eye drop 1 in each eye daily as needed.  If above regimen does not control symptoms then will consider retesting and restarting allergy immunotherapy.   Nasal polyp  See assessment and plan as above for allergic rhinitis.   Mild intermittent asthma without complication Well-controlled. No albuterol use.  Monitor symptoms.   May use albuterol rescue inhaler 2 puffs or nebulizer every 4 to 6 hours as needed for shortness of breath, chest tightness, coughing, and wheezing. May use albuterol rescue inhaler 2 puffs 5 to 15 minutes prior to strenuous physical activities. Monitor frequency of use.   Return in about 6 months (around 11/22/2019).  Diagnostics: None.  Medication List:  Current Outpatient Medications  Medication Sig Dispense Refill  . ALPRAZolam (XANAX) 0.25 MG tablet Take 1 tablet (0.25 mg total) by mouth 2 (two) times daily as needed for anxiety. 60 tablet 3  . cromolyn (NASALCROM) 5.2 MG/ACT nasal spray Place 1 spray into the nose 4 (four) times daily.      Marland Kitchen DILT-XR 240 MG 24 hr capsule TAKE (1) CAPSULE DAILY. 90 capsule 2  . escitalopram (LEXAPRO) 10 MG tablet TAKE (1/2) TABLET DAILY. 45 tablet 3  . Fluticasone Propionate (XHANCE) 93 MCG/ACT EXHU Place 2 sprays into both nostrils 2 (two) times daily. 32 mL 5  . hydrochlorothiazide (HYDRODIURIL) 25 MG tablet TAKE 1 TABLET BY MOUTH DAILY. 90 tablet 3  . ibuprofen (ADVIL,MOTRIN) 200 MG tablet Take 200 mg by mouth every 6 (six) hours as needed.      Marland Kitchen levothyroxine (SYNTHROID, LEVOTHROID) 112 MCG tablet Take 1 tablet (112 mcg total) by mouth  daily. 90 tablet 3  . Multiple Vitamins-Minerals (CENTRUM SILVER) tablet Take 1 tablet by mouth daily.    . Olopatadine HCl (PAZEO) 0.7 % SOLN Place 1 drop into both eyes daily as needed (itchy eyes). 1 Bottle 5  . ORACEA 40 MG capsule Take 40 mg by mouth daily as needed.     . potassium chloride (K-DUR) 10 MEQ tablet TAKE (2) TABLETS TWICE DAILY. 360 tablet 3  .  rosuvastatin (CRESTOR) 5 MG tablet TAKE 1 TABLET ONCE DAILY. 90 tablet 3   No current facility-administered medications for this visit.    Allergies: Allergies  Allergen Reactions  . Lisinopril     diarrhea  . Losartan     Fatigue, diarrhea   I reviewed his past medical history, social history, family history, and environmental history and no significant changes have been reported from previous visit on 01/03/2019.  Review of Systems  Constitutional: Negative for appetite change, chills, fever and unexpected weight change.  HENT: Positive for congestion. Negative for rhinorrhea.   Eyes: Negative for itching.  Respiratory: Negative for cough, chest tightness, shortness of breath and wheezing.   Gastrointestinal: Negative for abdominal pain.  Skin: Negative for rash.  Allergic/Immunologic: Positive for environmental allergies.  Neurological: Negative for headaches.   Objective: BP (!) 152/80 (BP Location: Right Arm, Patient Position: Sitting)   Pulse 68   Temp 97.9 F (36.6 C)   Resp 18   Ht 5' 10.5" (1.791 m)   Wt 223 lb 6.4 oz (101.3 kg)   SpO2 96%   BMI 31.60 kg/m  Body mass index is 31.6 kg/m. Physical Exam  Constitutional: He is oriented to person, place, and time. He appears well-developed and well-nourished.  HENT:  Head: Normocephalic and atraumatic.  Right Ear: External ear normal.  Left Ear: External ear normal.  Nose: Nose normal.  Mouth/Throat: Oropharynx is clear and moist.  Eyes: Conjunctivae and EOM are normal.  Neck: Neck supple.  Cardiovascular: Normal rate, regular rhythm and normal heart sounds. Exam reveals no gallop and no friction rub.  No murmur heard. Pulmonary/Chest: Effort normal and breath sounds normal. He has no wheezes. He has no rales.  Neurological: He is alert and oriented to person, place, and time.  Skin: Skin is warm. No rash noted.  Psychiatric: He has a normal mood and affect. His behavior is normal.  Nursing note and vitals  reviewed.  Previous notes and tests were reviewed. The plan was reviewed with the patient/family, and all questions/concerned were addressed.  It was my pleasure to see Jawon today and participate in his care. Please feel free to contact me with any questions or concerns.  Sincerely,  Rexene Alberts, DO Allergy & Immunology  [facility] office: [phone] [facility] office: [phone]

## 2019-05-23 NOTE — Assessment & Plan Note (Signed)
   See assessment and plan as above for allergic rhinitis.  

## 2019-05-23 NOTE — Patient Instructions (Addendum)
Seasonal and perennial allergic rhinitis  Continue Xhance 2 spray twice a day.   Nasal saline spray (i.e., Simply Saline) or nasal saline lavage (i.e., NeilMed) is recommended as needed and prior to medicated nasal sprays.  May use pazeo eye drop 1 in each eye daily as needed.  If above regimen does not control symptoms then will consider retesting and restarting allergy immunotherapy.   Mild intermittent asthma without complication  Monitor symptoms.  Follow up in 6 months

## 2019-05-23 NOTE — Assessment & Plan Note (Signed)
Past history - Patient used to be on allergy immunotherapy in the past and did not notice any worsening symptoms since cessation. However, he still has nasal congestion, rhinorrhea and itchy eyes.  Interim history - symptoms stable with below regimen. ENT evaluation found right nasal polyp - no surgery yet.    Continue Xhance 2 sprays twice a day.   Nasal saline spray (i.e., Simply Saline) or nasal saline lavage (i.e., NeilMed) is recommended as needed and prior to medicated nasal sprays.  May use pazeo eye drop 1 in each eye daily as needed.  If above regimen does not control symptoms then will consider retesting and restarting allergy immunotherapy.

## 2019-05-27 ENCOUNTER — Other Ambulatory Visit: Payer: BC Managed Care – PPO | Admitting: Internal Medicine

## 2019-05-27 ENCOUNTER — Other Ambulatory Visit: Payer: Self-pay

## 2019-05-27 DIAGNOSIS — Z0000 Encounter for general adult medical examination without abnormal findings: Secondary | ICD-10-CM

## 2019-05-27 DIAGNOSIS — F439 Reaction to severe stress, unspecified: Secondary | ICD-10-CM

## 2019-05-27 DIAGNOSIS — I10 Essential (primary) hypertension: Secondary | ICD-10-CM

## 2019-05-27 DIAGNOSIS — R5383 Other fatigue: Secondary | ICD-10-CM

## 2019-05-27 DIAGNOSIS — E781 Pure hyperglyceridemia: Secondary | ICD-10-CM

## 2019-05-27 DIAGNOSIS — Z125 Encounter for screening for malignant neoplasm of prostate: Secondary | ICD-10-CM

## 2019-05-27 DIAGNOSIS — E039 Hypothyroidism, unspecified: Secondary | ICD-10-CM

## 2019-05-28 LAB — COMPLETE METABOLIC PANEL WITH GFR
AG Ratio: 1.8 (calc) (ref 1.0–2.5)
ALT: 46 U/L (ref 9–46)
AST: 28 U/L (ref 10–35)
Albumin: 4.5 g/dL (ref 3.6–5.1)
Alkaline phosphatase (APISO): 71 U/L (ref 35–144)
BUN: 15 mg/dL (ref 7–25)
CO2: 25 mmol/L (ref 20–32)
Calcium: 9.7 mg/dL (ref 8.6–10.3)
Chloride: 102 mmol/L (ref 98–110)
Creat: 1.13 mg/dL (ref 0.70–1.33)
GFR, Est African American: 82 mL/min/{1.73_m2} (ref >=60)
GFR, Est Non African American: 71 mL/min/{1.73_m2} (ref >=60)
Globulin: 2.5 g/dL (calc) (ref 1.9–3.7)
Glucose, Bld: 104 mg/dL — ABNORMAL HIGH (ref 65–99)
Potassium: 4.1 mmol/L (ref 3.5–5.3)
Sodium: 140 mmol/L (ref 135–146)
Total Bilirubin: 0.6 mg/dL (ref 0.2–1.2)
Total Protein: 7 g/dL (ref 6.1–8.1)

## 2019-05-28 LAB — PSA: PSA: 0.2 ng/mL (ref <=4.0)

## 2019-05-28 LAB — LIPID PANEL
Cholesterol: 116 mg/dL (ref <200)
HDL: 31 mg/dL — ABNORMAL LOW (ref >=40)
LDL Cholesterol (Calc): 58 mg/dL (calc)
Non-HDL Cholesterol (Calc): 85 mg/dL (calc) (ref <130)
Total CHOL/HDL Ratio: 3.7 (calc) (ref <5.0)
Triglycerides: 196 mg/dL — ABNORMAL HIGH (ref <150)

## 2019-05-28 LAB — CBC WITH DIFFERENTIAL/PLATELET
Absolute Monocytes: 615 cells/uL (ref 200–950)
Basophils Absolute: 70 cells/uL (ref 0–200)
Basophils Relative: 1.2 %
Eosinophils Absolute: 220 cells/uL (ref 15–500)
Eosinophils Relative: 3.8 %
HCT: 49 % (ref 38.5–50.0)
Hemoglobin: 16.7 g/dL (ref 13.2–17.1)
Lymphs Abs: 1670 cells/uL (ref 850–3900)
MCH: 30.2 pg (ref 27.0–33.0)
MCHC: 34.1 g/dL (ref 32.0–36.0)
MCV: 88.6 fL (ref 80.0–100.0)
MPV: 10.3 fL (ref 7.5–12.5)
Monocytes Relative: 10.6 %
Neutro Abs: 3225 cells/uL (ref 1500–7800)
Neutrophils Relative %: 55.6 %
Platelets: 276 10*3/uL (ref 140–400)
RBC: 5.53 10*6/uL (ref 4.20–5.80)
RDW: 13.5 % (ref 11.0–15.0)
Total Lymphocyte: 28.8 %
WBC: 5.8 10*3/uL (ref 3.8–10.8)

## 2019-05-28 LAB — TSH: TSH: 1.58 mIU/L (ref 0.40–4.50)

## 2019-05-29 ENCOUNTER — Other Ambulatory Visit: Payer: Self-pay | Admitting: Dermatology

## 2019-05-30 ENCOUNTER — Encounter: Payer: Self-pay | Admitting: Internal Medicine

## 2019-05-30 ENCOUNTER — Other Ambulatory Visit: Payer: Self-pay

## 2019-05-30 ENCOUNTER — Ambulatory Visit (INDEPENDENT_AMBULATORY_CARE_PROVIDER_SITE_OTHER): Payer: BC Managed Care – PPO | Admitting: Internal Medicine

## 2019-05-30 VITALS — BP 148/88 | HR 71 | Temp 97.5°F | Resp 16 | Ht 70.5 in | Wt 223.0 lb

## 2019-05-30 DIAGNOSIS — E786 Lipoprotein deficiency: Secondary | ICD-10-CM

## 2019-05-30 DIAGNOSIS — I10 Essential (primary) hypertension: Secondary | ICD-10-CM | POA: Diagnosis not present

## 2019-05-30 DIAGNOSIS — Z0000 Encounter for general adult medical examination without abnormal findings: Secondary | ICD-10-CM | POA: Diagnosis not present

## 2019-05-30 DIAGNOSIS — K219 Gastro-esophageal reflux disease without esophagitis: Secondary | ICD-10-CM

## 2019-05-30 DIAGNOSIS — F32A Depression, unspecified: Secondary | ICD-10-CM

## 2019-05-30 DIAGNOSIS — E039 Hypothyroidism, unspecified: Secondary | ICD-10-CM | POA: Diagnosis not present

## 2019-05-30 DIAGNOSIS — J301 Allergic rhinitis due to pollen: Secondary | ICD-10-CM

## 2019-05-30 DIAGNOSIS — Z8709 Personal history of other diseases of the respiratory system: Secondary | ICD-10-CM

## 2019-05-30 DIAGNOSIS — F329 Major depressive disorder, single episode, unspecified: Secondary | ICD-10-CM

## 2019-05-30 DIAGNOSIS — G4733 Obstructive sleep apnea (adult) (pediatric): Secondary | ICD-10-CM

## 2019-05-30 DIAGNOSIS — E782 Mixed hyperlipidemia: Secondary | ICD-10-CM

## 2019-05-30 DIAGNOSIS — F419 Anxiety disorder, unspecified: Secondary | ICD-10-CM

## 2019-05-30 LAB — POCT URINALYSIS DIP (CLINITEK)
Bilirubin, UA: NEGATIVE
Blood, UA: NEGATIVE
Glucose, UA: NEGATIVE mg/dL
Ketones, POC UA: NEGATIVE mg/dL
Leukocytes, UA: NEGATIVE
Nitrite, UA: NEGATIVE
POC PROTEIN,UA: NEGATIVE
Spec Grav, UA: 1.02 (ref 1.010–1.025)
Urobilinogen, UA: 0.2 E.U./dL
pH, UA: 6.5 (ref 5.0–8.0)

## 2019-05-30 NOTE — Progress Notes (Signed)
   Subjective:    Patient ID: Ricky Sharp, male    DOB: [birth year], 58 y.o.   MRN  [id number]  HPI 58 year old Male in today for health maintenance exam and evaluation of medical issues including hypothyroidism, allergic rhinitis, GE reflux, excessive daytime sleepiness, anxiety and depression, asthma and hypertension.  Also history of impaired glucose tolerance.  He has been working from home during the pandemic.  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  History of obstructive sleep apnea treated with CPAP.  Followed by Dr. Acie Fredrickson for hypertension.  In July 2017 he had acute diverticulitis of the sigmoid colon treated with antibiotics.  In April of this year he had an E. coli urinary tract infection treated with Cipro.  Cholecystectomy 2004.  Negative myocardial perfusion study 2003 he was admitted to hospital with chest and epigastric pain.  Upper endoscopy was performed and he was diagnosed with peptic ulcer disease secondary to H. pylori infection.  MI was ruled out at that time.  Has perennial and seasonal rhinitis.  Tends to wheeze with respiratory infections.  Has seen allergist.  Stopped allergy injections in 2018.  Has seen Dr. [name] for hypersomnia.  This may be situational.  Additional past medical history: Fractured left clavicle 1981.  Wisdom teeth extracted 1992.  Osteochondroma removed from right tibia in 1993.  Kidney stone 2003.  Laceration of right hand August 2017 requiring sutures.  History of positive hepatitis B antibody with history of hepatitis in 1966.  Never immunized for Hepatitis B.      Review of Systems no new complaints-has office hypertension     Objective:   Physical Exam Vital signs reviewed.  Skin warm and dry.  Blood pressure 148/88.  Weight 223 pounds.  BMI 31.54  Nodes none.  TMs and pharynx are clear.  Neck is supple without JVD thyromegaly or carotid bruits.  Chest is clear to auscultation without rales or wheezing.  Cardiac exam  regular rate and rhythm normal S1 and S2.  Abdomen no hepatosplenomegaly masses or tenderness.  Prostate is normal without nodules.  No lower extremity edema.  No focal deficits on brief neurological exam.           Assessment & Plan:  BMI of 31-needs to lose some weight this will help his blood pressure control.  Essential hypertension-continue current regimen and monitor at home and call if elevated.  Continue HCTZ in diltiazem XR 240 mg daily  GE reflux-treated with PPI with remote history of H. pylori  Hypersomnia  Hyperlipidemia treated with statin  Anxiety depression treated with SSRI  Hypothyroidism treated with thyroid replacement  Sleep apnea treated with CPAP  Plan: Hemoglobin A1c is 5.4% but had fasting glucose of 104.  Recommend diet and exercise.  TSH is normal on current dose of thyroid replacement.  PSA is normal.  He has a low HDL cholesterol of 31.  Triglycerides are elevated at 196.  We could increase Crestor but I would like for him to do this with diet and exercise.  His urine dipstick showed glucose but his A1c is normal.  PSA is normal.  Recommend that he follow-up in 6 months.  Recommend diet exercise and weight loss.  Continue current medications.

## 2019-05-31 ENCOUNTER — Other Ambulatory Visit: Payer: BC Managed Care – PPO | Admitting: Internal Medicine

## 2019-05-31 ENCOUNTER — Other Ambulatory Visit: Payer: Self-pay | Admitting: Internal Medicine

## 2019-05-31 VITALS — BP 140/80 | HR 63 | Temp 97.3°F | Resp 16

## 2019-05-31 DIAGNOSIS — Z0000 Encounter for general adult medical examination without abnormal findings: Secondary | ICD-10-CM

## 2019-05-31 NOTE — Progress Notes (Signed)
Hgb AIC obtained. Continue to monitor BP at home

## 2019-05-31 NOTE — Progress Notes (Signed)
Pt here for Blood pressure check per pcp  Pt currently takes:hctz 25mg  daily   Pt reports compliance with medication.  BP today @ =140/80 HR =63

## 2019-05-31 NOTE — Addendum Note (Signed)
Addended by: Wynonia Musty A on: 05/31/2019 09:16 AM   Modules accepted: Orders

## 2019-06-04 LAB — HEMOGLOBIN A1C
Hgb A1c MFr Bld: 5.4 % of total Hgb (ref <5.7)
Mean Plasma Glucose: 108 (calc)
eAG (mmol/L): 6 (calc)

## 2019-06-04 LAB — HOUSE ACCOUNT TRACKING

## 2019-06-08 NOTE — Patient Instructions (Signed)
It was a pleasure to see you today.  Continue to work on diet exercise and weight loss and continue current medications.  Follow-up in 6 months.

## 2019-06-25 ENCOUNTER — Ambulatory Visit (INDEPENDENT_AMBULATORY_CARE_PROVIDER_SITE_OTHER): Payer: BC Managed Care – PPO | Admitting: Internal Medicine

## 2019-06-25 ENCOUNTER — Other Ambulatory Visit: Payer: Self-pay

## 2019-06-25 ENCOUNTER — Telehealth: Payer: Self-pay | Admitting: Internal Medicine

## 2019-06-25 DIAGNOSIS — R0989 Other specified symptoms and signs involving the circulatory and respiratory systems: Secondary | ICD-10-CM

## 2019-06-25 DIAGNOSIS — J029 Acute pharyngitis, unspecified: Secondary | ICD-10-CM | POA: Diagnosis not present

## 2019-06-25 NOTE — Telephone Encounter (Signed)
Virtual Visit scheduled °

## 2019-06-25 NOTE — Telephone Encounter (Signed)
Ricky Sharp [phone]  Jaykub called to say he is having cough, and sore throat, wife concerned about him having COVID. No exposure. Suggested virtual visit.

## 2019-06-26 ENCOUNTER — Other Ambulatory Visit: Payer: BC Managed Care – PPO

## 2019-06-27 ENCOUNTER — Other Ambulatory Visit: Payer: Self-pay | Admitting: Otolaryngology

## 2019-06-27 DIAGNOSIS — H93A2 Pulsatile tinnitus, left ear: Secondary | ICD-10-CM

## 2019-06-30 ENCOUNTER — Encounter: Payer: Self-pay | Admitting: Internal Medicine

## 2019-06-30 NOTE — Patient Instructions (Signed)
Arranging for COVID-19 testing at test center.  Monitor symptoms and call if symptoms worsen.

## 2019-06-30 NOTE — Progress Notes (Signed)
   Subjective:    Patient ID: Ricky Sharp, male    DOB: 1961/11/13, 58 y.o.   MRN: [id number]  HPI 58 year old male seen today by interactive audio and video telecommunications due to the coronavirus pandemic.  He is identified using 2 identifiers as Ricky Sharp of patient in this practice.  He is agreeable to visit in this format today.  Patient has a history of allergic rhinitis and asthma.  He has developed a cough.  His wife was out protesting recently and was tested for COVID-19.  Her result was subsequently negative.  Patient has not been protesting.  However he has been going into work at [employer] a few days a week.  It is not out of the realm of possibility that he could have had unknown Covid exposure.  No headache.  He does not have fever or shaking chills.      Review of Systems see above. History of anxiety and hypertension.  Complaining of scratchy throat.     Objective:   Physical Exam Reports no documented fever.  Appears to be in no acute distress.       Assessment & Plan:  Cough  Request testing for COVID-19  Plan: Arrange for testing at [facility] test site.  We will need to work from home until test is back.  Monitor symptoms and call if symptoms worsen.

## 2019-07-07 ENCOUNTER — Other Ambulatory Visit: Payer: Self-pay | Admitting: Allergy

## 2019-07-22 ENCOUNTER — Other Ambulatory Visit: Payer: Self-pay

## 2019-07-22 ENCOUNTER — Ambulatory Visit
Admission: RE | Admit: 2019-07-22 | Discharge: 2019-07-22 | Disposition: A | Discharge status: Home / Self Care | Payer: BC Managed Care – PPO | Source: Ambulatory Visit | Attending: Otolaryngology | Admitting: Otolaryngology

## 2019-07-22 DIAGNOSIS — H93A2 Pulsatile tinnitus, left ear: Secondary | ICD-10-CM

## 2019-07-22 MED ORDER — GADOBENATE DIMEGLUMINE 529 MG/ML IV SOLN
20.0000 mL | Freq: Once | INTRAVENOUS | Status: AC | PRN
  Administered 2019-07-22: 20 mL via INTRAVENOUS

## 2019-07-26 ENCOUNTER — Ambulatory Visit: Payer: BC Managed Care – PPO | Admitting: Internal Medicine

## 2019-07-26 ENCOUNTER — Ambulatory Visit
Admission: RE | Admit: 2019-07-26 | Discharge: 2019-07-26 | Disposition: A | Discharge status: Home / Self Care | Payer: BC Managed Care – PPO | Source: Ambulatory Visit | Attending: Internal Medicine | Admitting: Internal Medicine

## 2019-07-26 ENCOUNTER — Other Ambulatory Visit: Payer: Self-pay

## 2019-07-26 ENCOUNTER — Telehealth: Payer: Self-pay | Admitting: Internal Medicine

## 2019-07-26 VITALS — BP 140/90 | HR 80 | Temp 98.6°F | Ht 70.5 in | Wt 221.0 lb

## 2019-07-26 DIAGNOSIS — R1011 Right upper quadrant pain: Secondary | ICD-10-CM | POA: Diagnosis not present

## 2019-07-26 LAB — POCT URINALYSIS DIPSTICK
Appearance: NEGATIVE
Bilirubin, UA: NEGATIVE
Blood, UA: NEGATIVE
Glucose, UA: NEGATIVE
Ketones, UA: NEGATIVE
Leukocytes, UA: NEGATIVE
Nitrite, UA: NEGATIVE
Odor: NEGATIVE
Protein, UA: NEGATIVE
Spec Grav, UA: 1.015 (ref 1.010–1.025)
Urobilinogen, UA: 0.2 E.U./dL
pH, UA: 6.5 (ref 5.0–8.0)

## 2019-07-26 NOTE — Telephone Encounter (Signed)
Ricky Sharp [phone]  [name] called to say he woke up with abdominal pain this morning, has an area about the size of half dollar that is pressure sensitive.

## 2019-07-26 NOTE — Telephone Encounter (Signed)
Needs OV.  

## 2019-07-26 NOTE — Patient Instructions (Signed)
No evidence of bowel obstruction or constipation.  Monitor symptoms and call if not improving.

## 2019-07-26 NOTE — Telephone Encounter (Signed)
Scheduled OV.

## 2019-07-26 NOTE — Progress Notes (Signed)
   Subjective:    Patient ID: Ricky Sharp, male    DOB: 04-24-1961, 58 y.o.   MRN: [id number]  HPI 58 year old awakened this am with RUQ point tenderness. No fever chills, nausea or vomiting.   H. pylori infection 2003.  Cholecystectomy 2004.  Diverticulitis July 2017.  Denies constipation.  Review of Systems pain has lasted all day and is moderate in severity     Objective:   Physical Exam  Abdomen is soft without distention.  Bowel sounds are active.  No hepatosplenomegaly or masses appreciated.  There is mild right upper quadrant tenderness without rebound.      Assessment & Plan:  Possible constipation although he had normal bowel movement this morning versus musculoskeletal pain.  History of cholecystectomy.  Plan: He will have KUB flat and upright abdominal film with further instructions to follow-up.  Addendum: KUB does not show constipation.  Recommend clear liquids and advancing diet slowly.  Call if pain persists.

## 2019-08-02 ENCOUNTER — Other Ambulatory Visit: Payer: Self-pay | Admitting: Allergy

## 2019-08-06 ENCOUNTER — Other Ambulatory Visit: Payer: Self-pay | Admitting: Orthopedic Surgery

## 2019-08-07 ENCOUNTER — Encounter: Payer: Self-pay | Admitting: Internal Medicine

## 2019-08-28 ENCOUNTER — Telehealth: Payer: Self-pay

## 2019-08-28 NOTE — Telephone Encounter (Signed)
Spoke with pt and when over medications and made he aware to have vitals ready before virtual visit. Pt verbalized understanding and thanked me for the call.     Virtual Visit Pre-Appointment Phone Call  "(Name), I am calling you today to discuss your upcoming appointment. We are currently trying to limit exposure to the virus that causes COVID-19 by seeing patients at home rather than in the office."  1. "What is the BEST phone number to call the day of the visit?" - include this in appointment notes  2. "Do you have or have access to (through a family member/friend) a smartphone with video capability that we can use for your visit?" a. If yes - list this number in appt notes as "cell" (if different from BEST phone #) and list the appointment type as a VIDEO visit in appointment notes b. If no - list the appointment type as a PHONE visit in appointment notes  3. Confirm consent - "In the setting of the current Covid19 crisis, you are scheduled for a (phone or video) visit with your provider on (date) at (time).  Just as we do with many in-office visits, in order for you to participate in this visit, we must obtain consent.  If you'd like, I can send this to your mychart (if signed up) or email for you to review.  Otherwise, I can obtain your verbal consent now.  All virtual visits are billed to your insurance company just like a normal visit would be.  By agreeing to a virtual visit, we'd like you to understand that the technology does not allow for your provider to perform an examination, and thus may limit your provider's ability to fully assess your condition. If your provider identifies any concerns that need to be evaluated in person, we will make arrangements to do so.  Finally, though the technology is pretty good, we cannot assure that it will always work on either your or our end, and in the setting of a video visit, we may have to convert it to a phone-only visit.  In either situation, we  cannot ensure that we have a secure connection.  Are you willing to proceed?" STAFF: Did the patient verbally acknowledge consent to telehealth visit? Document YES/NO here: Yes  4. Advise patient to be prepared - "Two hours prior to your appointment, go ahead and check your blood pressure, pulse, oxygen saturation, and your weight (if you have the equipment to check those) and write them all down. When your visit starts, your provider will ask you for this information. If you have an Apple Watch or Kardia device, please plan to have heart rate information ready on the day of your appointment. Please have a pen and paper handy nearby the day of the visit as well."  5. Give patient instructions for MyChart download to smartphone OR [email or web address] as below if video visit (depending on what platform provider is using)  6. Inform patient they will receive a phone call 15 minutes prior to their appointment time (may be from unknown caller ID) so they should be prepared to answer    TELEPHONE CALL NOTE  SHEHZAD TELL has been deemed a candidate for a follow-up tele-health visit to limit community exposure during the Covid-19 pandemic. I spoke with the patient via phone to ensure availability of phone/video source, confirm preferred email & phone number, and discuss instructions and expectations.  I reminded MELBA VISAYA to be prepared with any vital sign and/or  heart rhythm information that could potentially be obtained via home monitoring, at the time of his visit. I reminded SHIVESH JURIS to expect a phone call prior to his visit.  Jacinta Shoe, CMA 08/28/2019 1:25 PM   INSTRUCTIONS FOR DOWNLOADING THE MYCHART APP TO SMARTPHONE  - The patient must first make sure to have activated MyChart and know their login information - If Apple, go to [employer] and type in MyChart in the search bar and download the app. If Android, ask patient to go to [employer] and type in [place] in the  search bar and download the app. The app is free but as with any other app downloads, their phone may require them to verify saved payment information or Apple/Android password.  - The patient will need to then log into the app with their MyChart username and password, and select [facility] as their healthcare provider to link the account. When it is time for your visit, go to the MyChart app, find appointments, and click Begin Video Visit. Be sure to Select Allow for your device to access the Microphone and Camera for your visit. You will then be connected, and your provider will be with you shortly.  **If they have any issues connecting, or need assistance please contact MyChart service desk [phone])**  **If using a computer, in order to ensure the best quality for their visit they will need to use either of the following Internet Browsers: [employer], or Google Chrome**  IF USING DOXIMITY or DOXY.ME - The patient will receive a link just prior to their visit by text.     FULL LENGTH CONSENT FOR TELE-HEALTH VISIT   I hereby voluntarily request, consent and authorize [facility] and its employed or contracted physicians, physician assistants, nurse practitioners or other licensed health care professionals (the Practitioner), to provide me with telemedicine health care services (the "Services") as deemed necessary by the treating Practitioner. I acknowledge and consent to receive the Services by the Practitioner via telemedicine. I understand that the telemedicine visit will involve communicating with the Practitioner through live audiovisual communication technology and the disclosure of certain medical information by electronic transmission. I acknowledge that I have been given the opportunity to request an in-person assessment or other available alternative prior to the telemedicine visit and am voluntarily participating in the telemedicine visit.  I understand that I  have the right to withhold or withdraw my consent to the use of telemedicine in the course of my care at any time, without affecting my right to future care or treatment, and that the Practitioner or I may terminate the telemedicine visit at any time. I understand that I have the right to inspect all information obtained and/or recorded in the course of the telemedicine visit and may receive copies of available information for a reasonable fee.  I understand that some of the potential risks of receiving the Services via telemedicine include:  Marland Kitchen Delay or interruption in medical evaluation due to technological equipment failure or disruption; . Information transmitted may not be sufficient (e.g. poor resolution of images) to allow for appropriate medical decision making by the Practitioner; and/or  . In rare instances, security protocols could fail, causing a breach of personal health information.  Furthermore, I acknowledge that it is my responsibility to provide information about my medical history, conditions and care that is complete and accurate to the best of my ability. I acknowledge that Practitioner's advice, recommendations, and/or decision may be  based on factors not within their control, such as incomplete or inaccurate data provided by me or distortions of diagnostic images or specimens that may result from electronic transmissions. I understand that the practice of medicine is not an exact science and that Practitioner makes no warranties or guarantees regarding treatment outcomes. I acknowledge that I will receive a copy of this consent concurrently upon execution via email to the email address I last provided but may also request a printed copy by calling the office of [facility].    I understand that my insurance will be billed for this visit.   I have read or had this consent read to me. . I understand the contents of this consent, which adequately explains the benefits and risks of the  Services being provided via telemedicine.  . I have been provided ample opportunity to ask questions regarding this consent and the Services and have had my questions answered to my satisfaction. . I give my informed consent for the services to be provided through the use of telemedicine in my medical care  By participating in this telemedicine visit I agree to the above.

## 2019-08-29 ENCOUNTER — Telehealth (INDEPENDENT_AMBULATORY_CARE_PROVIDER_SITE_OTHER): Payer: BC Managed Care – PPO | Admitting: Cardiovascular Disease

## 2019-08-29 ENCOUNTER — Other Ambulatory Visit: Payer: Self-pay

## 2019-08-29 ENCOUNTER — Encounter: Payer: Self-pay | Admitting: Cardiovascular Disease

## 2019-08-29 VITALS — BP 146/77 | HR 59 | Ht 70.0 in | Wt 212.5 lb

## 2019-08-29 DIAGNOSIS — E781 Pure hyperglyceridemia: Secondary | ICD-10-CM

## 2019-08-29 DIAGNOSIS — I10 Essential (primary) hypertension: Secondary | ICD-10-CM

## 2019-08-29 DIAGNOSIS — E039 Hypothyroidism, unspecified: Secondary | ICD-10-CM

## 2019-08-29 MED ORDER — DOXAZOSIN MESYLATE 2 MG PO TABS: 2.0000 mg | ORAL_TABLET | Freq: Every day | ORAL | 11 refills | Status: DC

## 2019-08-29 NOTE — Progress Notes (Signed)
Virtual Visit via Video Note   This visit type was conducted due to national recommendations for restrictions regarding the COVID-19 Pandemic (e.g. social distancing) in an effort to limit this patient's exposure and mitigate transmission in our community.  Due to his co-morbid illnesses, this patient is at least at moderate risk for complications without adequate follow up.  This format is felt to be most appropriate for this patient at this time.  All issues noted in this document were discussed and addressed.  A limited physical exam was performed with this format.  Please refer to the patient's chart for his consent to telehealth for [facility].   Date:  08/29/2019   ID:  Ricky Sharp, DOB 01/20/1961, MRN [id number]  Patient Location: Home Provider Location: Office  PCP:  Elby Showers, MD  Cardiologist:  Mertie Moores, MD  Electrophysiologist:  None   Problem List 1. Hypertension 2. Anxiety - stress related 3. GERD 4.  Right carotid artery 3 mm aneurysm   Previous Notes:   Patient is a 58 year old gentleman with a history of hypertension. He's tried several medications in the past that have not completely controlled his blood pressure.  - He has tried Losartan but had some dizziness / dry mouth.  - tried bystolic 5 mg - felt poorly, persistant head ache, diarrhea, mild respiratory distress. Tried for a week  He has also tried some antianxiety meds but had various reactions.  He has multiple family and life stresses.   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 He is also having some family health issues that require him to spend lots of time with them.  He has felt well on his HCTZ and potassium since his last visit. He has not been exercising as much as he would like.  Oct. 1, 2014:  Ricky Sharp is a 58 yo with hx of HTN. He has not been exercising. He has gained  10 lbs. He has tried Losartan but it caused vertigo. He tried beta blockers but had severe fatigue and slow HR. He does not want to start any new meds. He knows that his weight is up and that is BP has been elevated.   March 04, 2014:  Ricky Sharp has been diagnosed with hypothyroidism since I last saw him. He is feeling somewhat better.  Diastolic BP has still been a bit high. Still eating some salt. BP at home is typically is the 80s - occasionally upper 80s.   Oct. 1, 2015:  Ricky Sharp is now off the Losartan - was having lots of fatigue and diarrhea. Symptoms are better. On HCTZ and blood pressures are well controlled. Rides his bike on occasion. He also is taking Judo . Not Having any episodes of chest pain BP has been ok  Jan. 21, 2016:  Ricky Sharp has had HTN that has been difficult to manage.  He developed GI complaints with Losartan and with Lisinopril.  He had significant diarrhea on the Lisinopril - has resolved.   April 03, 2015:  Ricky Sharp has been seen in the hypertension clinic for the past several months. His blood pressure is now well controlled. No CP , no dyspnea. Still has anxiety.   April 04, 2016: Ricky Sharp is doing well  Weight is up a little  Mother died , lots of stress.  Amlodipine still causes diarrhea - ( like clockwork)   May 18, 2017  BP has been well controled at home.  Still eating a little more salt than he should. Not as active.   Weight is staying elevated.  Mother died 2 years ago and he had a struggle working through the estate.  No exercising   Jan. 8, 2018:  Ricky Sharp is seen today for follow up of his HTN. Exercising some.  Hoping to get out more . 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       December 20, 2017:  Ricky Sharp, seen today for further evaluation and management of his hypertension. No CP or dyspnea . Eats some salt,  Not much  Does not exercise  - HDL is 28 Had a reaction to Losartan  and Lisinopril   Sept. 17, 2020  Ricky Sharp is seen today for follow up of his HTN He is scheduled to have surgery for a trigger finger next month   Evaluation Performed:  Follow-Up Visit  Chief Complaint:  HTN  Sept. 17, 2020   Ricky Sharp is a 58 y.o. male with hx o fHTN, Was found to have a 3 mm aneurism in his right carotid.  Symptoms were swooshing in his ear.  Has seen neurosurgery   Exercising more than he was .   Rides his bike  30-50 min several times a week  Some light weights    The patient does not have symptoms concerning for COVID-19 infection (fever, chills, cough, or new shortness of breath).    Past Medical History:  Diagnosis Date  . Eczema   . Recurrent upper respiratory infection (URI)    Past Surgical History:  Procedure Laterality Date  . CHOLECYSTECTOMY    . OSTEOCHONDROMA EXCISION    . TONSILLECTOMY    . WISDOM TOOTH EXTRACTION       No outpatient medications have been marked as taking for the 08/29/19 encounter (Telemedicine) with Yariel Ferraris, Wonda Cheng, MD.     Allergies:   Lisinopril and Losartan   Social History   Tobacco Use  . Smoking status: Never Smoker  . Smokeless tobacco: Never Used  Substance Use Topics  . Alcohol use: Yes    Alcohol/week: 2.0 standard drinks    Types: 2 Glasses of wine per week  . Drug use: No     Family Hx: The patient's family history includes Fibromyalgia in his mother; Lung disease in his mother; Syncope episode in his mother. He was adopted.  ROS:   Please see the history of present illness.     All other systems reviewed and are negative.   Prior CV studies:   The following studies were reviewed today:    Labs/Other Tests and Data Reviewed:    EKG: no ECG reviewed.   Recent Labs: 05/27/2019: ALT 46; BUN 15; Creat 1.13; Hemoglobin 16.7; Platelets 276; Potassium 4.1; Sodium 140; TSH 1.58   Recent Lipid Panel Lab Results  Component Value Date/Time   CHOL 116 05/27/2019 11:58 AM   TRIG  196 (H) 05/27/2019 11:58 AM   HDL 31 (L) 05/27/2019 11:58 AM   CHOLHDL 3.7 05/27/2019 11:58 AM   LDLCALC 58 05/27/2019 11:58 AM    Wt Readings from Last 3 Encounters:  08/29/19 212 lb 8 oz (96.4 kg)  07/26/19 221 lb (100.2 kg)  05/30/19 223 lb (101.2 kg)     Objective:    Vital Signs:  BP (!) 146/77 (BP Location: Left Arm, Patient Position: Sitting, Cuff Size: Normal)   Pulse (!) 59   Ht 5\' 10"  (1.778 m)  Wt 212 lb 8 oz (96.4 kg)   BMI 30.49 kg/m    Physical Exam: Blood pressure (!) 146/77, pulse (!) 59, height 5\' 10"  (1.778 m), weight 212 lb 8 oz (96.4 kg).  GEN:  Well nourished, well developed in no acute distress HEENT: Normal NECK: no obvious abn.  CARDIAC:  Normal ,  No evidence of distress  RESPIRATORY:  Breathing appears normal  ABDOMEN: non distended.  MUSCULOSKELETAL:  No edema  NEUROLOGIC:  Alert and oriented x 3   ASSESSMENT & PLAN:    Essential HTN:   BP remains a bit elevated   .   Will add cardura 2 mg Q HS.  He will send in BP readings in several weeks.    2.  Hyperlipidemia:  Cont crestor.  Last labs in June look great   3.  Mod. Obesity:  Encouraged  More exercise and weight loss.   4.  Anxiety - managed by his medical doctor  Cont meds.     COVID-19 Education: The signs and symptoms of COVID-19 were discussed with the patient and how to seek care for testing (follow up with PCP or arrange E-visit).  The importance of social distancing was discussed today.  Time:   Today, I have spent  21  minutes with the patient with telehealth technology discussing the above problems.     Medication Adjustments/Labs and Tests Ordered: Current medicines are reviewed at length with the patient today.  Concerns regarding medicines are outlined above.   Tests Ordered: No orders of the defined types were placed in this encounter.   Medication Changes: Meds ordered this encounter  Medications  . doxazosin (CARDURA) 2 MG tablet    Sig: Take 1 tablet (2  mg total) by mouth at bedtime.    Dispense:  30 tablet    Refill:  11    Follow Up:  In Person in 6 month(s)  Signed, Mertie Moores, MD  08/29/2019 10:02 AM    [place]

## 2019-08-29 NOTE — Patient Instructions (Signed)
Medication Instructions:  Your physician has recommended you make the following change in your medication:  START Cardura (Doxazosin) 2 mg at bedtime  If you need a refill on your cardiac medications before your next appointment, please call your pharmacy.    Lab work: None Ordered   Testing/Procedures: None Ordered   Follow-Up: At [facility], you and your health needs are our priority.  As part of our continuing mission to provide you with exceptional heart care, we have created designated Provider Care Teams.  These Care Teams include your primary Cardiologist (physician) and Advanced Practice Providers (APPs -  Physician Assistants and Nurse Practitioners) who all work together to provide you with the care you need, when you need it. You will need a follow up appointment in:  6 months.  Please call our office 2 months in advance to schedule this appointment.  You may see Mertie Moores, MD or one of the following Advanced Practice Providers on your designated Care Team: Richardson Dopp, PA-C [place], [place] . Daune Perch, NP  Call us with BP readings in 2-3 weeks after starting the Doxazosin (Cardura) HOW TO TAKE YOUR BLOOD PRESSURE:  Rest 5 minutes before taking your blood pressure.   Don't smoke or drink caffeinated beverages for at least 30 minutes before.  Take your blood pressure before (not after) you eat.  Sit comfortably with your back supported and both feet on the floor (don't cross your legs).  Elevate your arm to heart level on a table or a desk.  Use the proper sized cuff. It should fit smoothly and snugly around your bare upper arm. There should be enough room to slip a fingertip under the cuff. The bottom edge of the cuff should be 1 inch above the crease of the elbow.  Ideally, take 3 measurements at one sitting and record the average.

## 2019-09-03 ENCOUNTER — Other Ambulatory Visit: Payer: Self-pay | Admitting: Cardiovascular Disease

## 2019-09-04 ENCOUNTER — Other Ambulatory Visit: Payer: Self-pay | Admitting: Dermatology

## 2019-09-04 DIAGNOSIS — C4492 Squamous cell carcinoma of skin, unspecified: Secondary | ICD-10-CM

## 2019-09-04 HISTORY — DX: Squamous cell carcinoma of skin, unspecified: C44.92

## 2019-09-13 ENCOUNTER — Other Ambulatory Visit: Payer: Self-pay

## 2019-09-13 MED ORDER — XHANCE 93 MCG/ACT NA EXHU: 2.0000 | INHALANT_SUSPENSION | Freq: Two times a day (BID) | NASAL | 4 refills | Status: DC

## 2019-09-17 ENCOUNTER — Other Ambulatory Visit (HOSPITAL_COMMUNITY): Admission: RE | Admit: 2019-09-17 | Payer: BC Managed Care – PPO | Source: Ambulatory Visit

## 2019-10-03 ENCOUNTER — Other Ambulatory Visit: Payer: Self-pay | Admitting: Dermatology

## 2019-10-03 DIAGNOSIS — C4492 Squamous cell carcinoma of skin, unspecified: Secondary | ICD-10-CM

## 2019-10-03 HISTORY — DX: Squamous cell carcinoma of skin, unspecified: C44.92

## 2019-10-27 ENCOUNTER — Other Ambulatory Visit: Payer: Self-pay | Admitting: Cardiovascular Disease

## 2019-10-27 ENCOUNTER — Other Ambulatory Visit: Payer: Self-pay | Admitting: Internal Medicine

## 2019-11-14 ENCOUNTER — Other Ambulatory Visit: Payer: Self-pay | Admitting: Internal Medicine

## 2019-11-18 ENCOUNTER — Encounter (HOSPITAL_BASED_OUTPATIENT_CLINIC_OR_DEPARTMENT_OTHER): Payer: Self-pay

## 2019-11-18 ENCOUNTER — Other Ambulatory Visit: Payer: Self-pay

## 2019-11-22 ENCOUNTER — Other Ambulatory Visit (HOSPITAL_COMMUNITY)
Admission: RE | Admit: 2019-11-22 | Discharge: 2019-11-22 | Disposition: A | Discharge status: Home / Self Care | Payer: BC Managed Care – PPO | Source: Ambulatory Visit | Attending: Orthopedic Surgery | Admitting: Orthopedic Surgery

## 2019-11-22 ENCOUNTER — Other Ambulatory Visit: Payer: Self-pay

## 2019-11-22 ENCOUNTER — Encounter (HOSPITAL_BASED_OUTPATIENT_CLINIC_OR_DEPARTMENT_OTHER)
Admission: RE | Admit: 2019-11-22 | Discharge: 2019-11-22 | Disposition: A | Discharge status: Home / Self Care | Payer: BC Managed Care – PPO | Source: Ambulatory Visit | Attending: Orthopedic Surgery | Admitting: Orthopedic Surgery

## 2019-11-22 DIAGNOSIS — G4730 Sleep apnea, unspecified: Secondary | ICD-10-CM | POA: Diagnosis not present

## 2019-11-22 DIAGNOSIS — Z20828 Contact with and (suspected) exposure to other viral communicable diseases: Secondary | ICD-10-CM | POA: Insufficient documentation

## 2019-11-22 DIAGNOSIS — M65841 Other synovitis and tenosynovitis, right hand: Secondary | ICD-10-CM | POA: Diagnosis not present

## 2019-11-22 DIAGNOSIS — Z888 Allergy status to other drugs, medicaments and biological substances status: Secondary | ICD-10-CM | POA: Diagnosis not present

## 2019-11-22 DIAGNOSIS — Z01812 Encounter for preprocedural laboratory examination: Secondary | ICD-10-CM | POA: Diagnosis not present

## 2019-11-22 DIAGNOSIS — M65331 Trigger finger, right middle finger: Secondary | ICD-10-CM | POA: Diagnosis not present

## 2019-11-22 DIAGNOSIS — I10 Essential (primary) hypertension: Secondary | ICD-10-CM | POA: Diagnosis not present

## 2019-11-22 DIAGNOSIS — J45909 Unspecified asthma, uncomplicated: Secondary | ICD-10-CM | POA: Diagnosis not present

## 2019-11-22 LAB — BASIC METABOLIC PANEL
Anion gap: 9 (ref 5–15)
BUN: 17 mg/dL (ref 6–20)
CO2: 27 mmol/L (ref 22–32)
Calcium: 9.1 mg/dL (ref 8.9–10.3)
Chloride: 103 mmol/L (ref 98–111)
Creatinine, Ser: 1.27 mg/dL — ABNORMAL HIGH (ref 0.61–1.24)
GFR calc Af Amer: >60 mL/min (ref >60)
GFR calc non Af Amer: >60 mL/min (ref >60)
Glucose, Bld: 141 mg/dL — ABNORMAL HIGH (ref 70–99)
Potassium: 3.8 mmol/L (ref 3.5–5.1)
Sodium: 139 mmol/L (ref 135–145)

## 2019-11-22 NOTE — Progress Notes (Signed)

## 2019-11-23 LAB — NOVEL CORONAVIRUS, NAA (HOSP ORDER, SEND-OUT TO REF LAB; TAT 18-24 HRS): SARS-CoV-2, NAA: NOT DETECTED

## 2019-11-26 ENCOUNTER — Ambulatory Visit (HOSPITAL_BASED_OUTPATIENT_CLINIC_OR_DEPARTMENT_OTHER): Payer: BC Managed Care – PPO | Admitting: Certified Registered Nurse Anesthetist

## 2019-11-26 ENCOUNTER — Ambulatory Visit (HOSPITAL_BASED_OUTPATIENT_CLINIC_OR_DEPARTMENT_OTHER)
Admission: RE | Admit: 2019-11-26 | Discharge: 2019-11-26 | Disposition: A | Discharge status: Home / Self Care | Payer: BC Managed Care – PPO | Attending: Orthopedic Surgery | Admitting: Orthopedic Surgery

## 2019-11-26 ENCOUNTER — Encounter (HOSPITAL_BASED_OUTPATIENT_CLINIC_OR_DEPARTMENT_OTHER): Payer: Self-pay | Admitting: Orthopedic Surgery

## 2019-11-26 ENCOUNTER — Other Ambulatory Visit: Payer: Self-pay

## 2019-11-26 ENCOUNTER — Encounter (HOSPITAL_BASED_OUTPATIENT_CLINIC_OR_DEPARTMENT_OTHER)
Admission: RE | Disposition: A | Discharge status: Home / Self Care | Payer: Self-pay | Source: Home / Self Care | Attending: Orthopedic Surgery

## 2019-11-26 DIAGNOSIS — Z888 Allergy status to other drugs, medicaments and biological substances status: Secondary | ICD-10-CM | POA: Insufficient documentation

## 2019-11-26 DIAGNOSIS — G4730 Sleep apnea, unspecified: Secondary | ICD-10-CM | POA: Insufficient documentation

## 2019-11-26 DIAGNOSIS — I10 Essential (primary) hypertension: Secondary | ICD-10-CM | POA: Insufficient documentation

## 2019-11-26 DIAGNOSIS — M65841 Other synovitis and tenosynovitis, right hand: Secondary | ICD-10-CM | POA: Diagnosis not present

## 2019-11-26 DIAGNOSIS — J45909 Unspecified asthma, uncomplicated: Secondary | ICD-10-CM | POA: Insufficient documentation

## 2019-11-26 DIAGNOSIS — M65331 Trigger finger, right middle finger: Secondary | ICD-10-CM | POA: Insufficient documentation

## 2019-11-26 HISTORY — DX: Hypothyroidism, unspecified: E03.9

## 2019-11-26 HISTORY — PX: TRIGGER FINGER RELEASE: SHX641

## 2019-11-26 HISTORY — DX: Sleep apnea, unspecified: G47.30

## 2019-11-26 HISTORY — DX: Anxiety disorder, unspecified: F41.9

## 2019-11-26 SURGERY — RELEASE, A1 PULLEY, FOR TRIGGER FINGER
Anesthesia: Monitor Anesthesia Care | Site: Hand | Laterality: Right

## 2019-11-26 MED ORDER — CEFAZOLIN SODIUM-DEXTROSE 2-4 GM/100ML-% IV SOLN
2.0000 g | INTRAVENOUS | Status: AC
  Administered 2019-11-26: 2 g via INTRAVENOUS

## 2019-11-26 MED ORDER — ACETAMINOPHEN 325 MG PO TABS: 325.0000 mg | ORAL_TABLET | ORAL | Status: DC | PRN

## 2019-11-26 MED ORDER — LIDOCAINE HCL (PF) 0.5 % IJ SOLN
INTRAMUSCULAR | Status: DC | PRN
  Administered 2019-11-26: 35 mL via INTRAVENOUS

## 2019-11-26 MED ORDER — ONDANSETRON HCL 4 MG/2ML IJ SOLN: 4.0000 mg | Freq: Once | INTRAMUSCULAR | Status: DC | PRN

## 2019-11-26 MED ORDER — FENTANYL CITRATE (PF) 100 MCG/2ML IJ SOLN
INTRAMUSCULAR | Status: DC | PRN
  Administered 2019-11-26: 100 ug via INTRAVENOUS

## 2019-11-26 MED ORDER — LIDOCAINE 2% (20 MG/ML) 5 ML SYRINGE
INTRAMUSCULAR | Status: AC
  Filled 2019-11-26: qty 5

## 2019-11-26 MED ORDER — FENTANYL CITRATE (PF) 100 MCG/2ML IJ SOLN: 25.0000 ug | INTRAMUSCULAR | Status: DC | PRN

## 2019-11-26 MED ORDER — CHLORHEXIDINE GLUCONATE 4 % EX LIQD: 60.0000 mL | Freq: Once | CUTANEOUS | Status: DC

## 2019-11-26 MED ORDER — PROPOFOL 500 MG/50ML IV EMUL
INTRAVENOUS | Status: DC | PRN
  Administered 2019-11-26: 75 ug/kg/min via INTRAVENOUS

## 2019-11-26 MED ORDER — LACTATED RINGERS IV SOLN: INTRAVENOUS | Status: DC

## 2019-11-26 MED ORDER — FENTANYL CITRATE (PF) 100 MCG/2ML IJ SOLN: 50.0000 ug | INTRAMUSCULAR | Status: DC | PRN

## 2019-11-26 MED ORDER — BUPIVACAINE HCL (PF) 0.25 % IJ SOLN
INTRAMUSCULAR | Status: DC | PRN
  Administered 2019-11-26: 7 mL

## 2019-11-26 MED ORDER — ONDANSETRON HCL 4 MG/2ML IJ SOLN
INTRAMUSCULAR | Status: DC | PRN
  Administered 2019-11-26: 4 mg via INTRAVENOUS

## 2019-11-26 MED ORDER — OXYCODONE HCL 5 MG/5ML PO SOLN: 5.0000 mg | Freq: Once | ORAL | Status: DC | PRN

## 2019-11-26 MED ORDER — MEPERIDINE HCL 25 MG/ML IJ SOLN: 6.2500 mg | INTRAMUSCULAR | Status: DC | PRN

## 2019-11-26 MED ORDER — FENTANYL CITRATE (PF) 100 MCG/2ML IJ SOLN
INTRAMUSCULAR | Status: AC
  Filled 2019-11-26: qty 2

## 2019-11-26 MED ORDER — CEFAZOLIN SODIUM-DEXTROSE 2-4 GM/100ML-% IV SOLN
INTRAVENOUS | Status: AC
  Filled 2019-11-26: qty 100

## 2019-11-26 MED ORDER — ONDANSETRON HCL 4 MG/2ML IJ SOLN
INTRAMUSCULAR | Status: AC
  Filled 2019-11-26: qty 2

## 2019-11-26 MED ORDER — MIDAZOLAM HCL 5 MG/5ML IJ SOLN
INTRAMUSCULAR | Status: DC | PRN
  Administered 2019-11-26: 2 mg via INTRAVENOUS

## 2019-11-26 MED ORDER — MIDAZOLAM HCL 2 MG/2ML IJ SOLN
INTRAMUSCULAR | Status: AC
  Filled 2019-11-26: qty 2

## 2019-11-26 MED ORDER — TRAMADOL HCL 50 MG PO TABS: 50.0000 mg | ORAL_TABLET | Freq: Four times a day (QID) | ORAL | 0 refills | Status: DC | PRN

## 2019-11-26 MED ORDER — PROPOFOL 500 MG/50ML IV EMUL
INTRAVENOUS | Status: AC
  Filled 2019-11-26: qty 50

## 2019-11-26 MED ORDER — OXYCODONE HCL 5 MG PO TABS: 5.0000 mg | ORAL_TABLET | Freq: Once | ORAL | Status: DC | PRN

## 2019-11-26 MED ORDER — MIDAZOLAM HCL 2 MG/2ML IJ SOLN: 1.0000 mg | INTRAMUSCULAR | Status: DC | PRN

## 2019-11-26 MED ORDER — ACETAMINOPHEN 160 MG/5ML PO SOLN: 325.0000 mg | ORAL | Status: DC | PRN

## 2019-11-26 SURGICAL SUPPLY — 37 items
APL PRP STRL LF DISP 70% ISPRP (MISCELLANEOUS) ×1
BLADE SURG 15 STRL LF DISP TIS (BLADE) ×1 IMPLANT
BLADE SURG 15 STRL SS (BLADE) ×2
BNDG CMPR 9X4 STRL LF SNTH (GAUZE/BANDAGES/DRESSINGS)
BNDG COHESIVE 2X5 TAN STRL LF (GAUZE/BANDAGES/DRESSINGS) ×2 IMPLANT
BNDG COHESIVE 3X5 TAN STRL LF (GAUZE/BANDAGES/DRESSINGS) ×1 IMPLANT
BNDG ESMARK 4X9 LF (GAUZE/BANDAGES/DRESSINGS) IMPLANT
CHLORAPREP W/TINT 26 (MISCELLANEOUS) ×2 IMPLANT
CORD BIPOLAR FORCEPS 12FT (ELECTRODE) IMPLANT
COVER BACK TABLE REUSABLE LG (DRAPES) ×2 IMPLANT
COVER MAYO STAND REUSABLE (DRAPES) ×2 IMPLANT
COVER WAND RF STERILE (DRAPES) IMPLANT
CUFF TOURN SGL QUICK 18X4 (TOURNIQUET CUFF) IMPLANT
DECANTER SPIKE VIAL GLASS SM (MISCELLANEOUS) IMPLANT
DRAPE EXTREMITY T 121X128X90 (DISPOSABLE) ×2 IMPLANT
DRAPE SURG 17X23 STRL (DRAPES) ×2 IMPLANT
GAUZE SPONGE 4X4 12PLY STRL (GAUZE/BANDAGES/DRESSINGS) ×2 IMPLANT
GAUZE SPONGE 4X4 12PLY STRL LF (GAUZE/BANDAGES/DRESSINGS) ×1 IMPLANT
GAUZE XEROFORM 1X8 LF (GAUZE/BANDAGES/DRESSINGS) ×2 IMPLANT
GLOVE BIOGEL PI IND STRL 8.5 (GLOVE) ×1 IMPLANT
GLOVE BIOGEL PI INDICATOR 8.5 (GLOVE) ×1
GLOVE SURG ORTHO 8.0 STRL STRW (GLOVE) ×2 IMPLANT
GOWN STRL REUS W/ TWL LRG LVL3 (GOWN DISPOSABLE) ×1 IMPLANT
GOWN STRL REUS W/TWL LRG LVL3 (GOWN DISPOSABLE) ×2
GOWN STRL REUS W/TWL XL LVL3 (GOWN DISPOSABLE) ×2 IMPLANT
NDL PRECISIONGLIDE 27X1.5 (NEEDLE) ×1 IMPLANT
NEEDLE PRECISIONGLIDE 27X1.5 (NEEDLE) ×2 IMPLANT
NS IRRIG 1000ML POUR BTL (IV SOLUTION) ×2 IMPLANT
PACK BASIN DAY SURGERY FS (CUSTOM PROCEDURE TRAY) ×2 IMPLANT
PAD CAST 3X4 CTTN HI CHSV (CAST SUPPLIES) IMPLANT
PADDING CAST COTTON 3X4 STRL (CAST SUPPLIES) ×2
STOCKINETTE 4X48 STRL (DRAPES) ×2 IMPLANT
SUT ETHILON 4 0 PS 2 18 (SUTURE) ×2 IMPLANT
SYR BULB 3OZ (MISCELLANEOUS) ×2 IMPLANT
SYR CONTROL 10ML LL (SYRINGE) ×2 IMPLANT
TOWEL GREEN STERILE FF (TOWEL DISPOSABLE) ×4 IMPLANT
UNDERPAD 30X36 HEAVY ABSORB (UNDERPADS AND DIAPERS) ×2 IMPLANT

## 2019-11-26 NOTE — Anesthesia Preprocedure Evaluation (Addendum)
Anesthesia Evaluation  Patient identified by MRN, date of birth, ID band Patient awake    Reviewed: Allergy & Precautions, NPO status , Patient's Chart, lab work & pertinent test results  Airway Mallampati: II  TM Distance: <3 FB Neck ROM: Full   Comment: Retrognathia Dental no notable dental hx.    Pulmonary asthma , sleep apnea ,    Pulmonary exam normal breath sounds clear to auscultation       Cardiovascular hypertension, Pt. on medications + angina Normal cardiovascular exam Rhythm:Regular Rate:Normal     Neuro/Psych PSYCHIATRIC DISORDERS Anxiety Depression negative neurological ROS     GI/Hepatic Neg liver ROS, GERD  Medicated,  Endo/Other  Hypothyroidism   Renal/GU negative Renal ROS  negative genitourinary   Musculoskeletal negative musculoskeletal ROS (+)   Abdominal   Peds negative pediatric ROS (+)  Hematology negative hematology ROS (+)   Anesthesia Other Findings   Reproductive/Obstetrics negative OB ROS                            Anesthesia Physical Anesthesia Plan  ASA: II  Anesthesia Plan: MAC and Bier Block and Bier Block-LIDOCAINE ONLY   Post-op Pain Management:    Induction: Intravenous  PONV Risk Score and Plan: 1  Airway Management Planned: Mask, Natural Airway and Nasal Cannula  Additional Equipment:   Intra-op Plan:   Post-operative Plan:   Informed Consent:   Plan Discussed with: Anesthesiologist, CRNA and Surgeon  Anesthesia Plan Comments:         Anesthesia Quick Evaluation

## 2019-11-26 NOTE — Discharge Instructions (Addendum)

## 2019-11-26 NOTE — Anesthesia Postprocedure Evaluation (Signed)
Anesthesia Post Note  Patient: Ricky Sharp  Procedure(s) Performed: RELEASE TRIGGER FINGER/A-1 PULLEY RIGHT MIDDLE FINGER (Right Hand)     Anesthesia Post Evaluation  Last Vitals:  Vitals:   11/26/19 0957 11/26/19 1000  BP: 132/86 129/83  Pulse: 64 64  Resp: 14 19  Temp:    SpO2: 97% 96%    Last Pain:  Vitals:   11/26/19 0946  TempSrc:   PainSc: Asleep                 Hodge Stachnik

## 2019-11-26 NOTE — H&P (Signed)
  Ricky Sharp is an 58 y.o. male.   Chief Complaint:catching right middle finger HPI: Ricky Sharp is a 58 year old ambidextrous male referred by Dr. Nevada Crane for consultation regarding catching of his right middle finger which is been going approximately 12 months. He recalls no history of injury. He states is worse in the morning. He has not had any treatment. He states running under warm water with help. Patient has taken the other hand to straighten it out. This will give him a sharp pain 5/10 otherwise it feels like a bruise. He has a history of thyroid problems no history of diabetes arthritis or gout. Family history is positive thyroid problems arthritis negative for diabetes and gout.  He has had 2 injections. This continues to catch for him. Second injection gave him temporary relief but it began several months ago. His discomfort is more like a bruise than actual pain.   Past Medical History:  Diagnosis Date  . Anxiety   . Eczema   . Hypertension   . Hypothyroidism   . Recurrent upper respiratory infection (URI)   . Sleep apnea    borderline, uses dental device    Past Surgical History:  Procedure Laterality Date  . CHOLECYSTECTOMY    . OSTEOCHONDROMA EXCISION    . TONSILLECTOMY    . WISDOM TOOTH EXTRACTION      Family History  Adopted: Yes  Problem Relation Age of Onset  . Fibromyalgia Mother   . Syncope episode Mother   . Lung disease Mother    Social History:  reports that he has never smoked. He has never used smokeless tobacco. He reports current alcohol use of about 2.0 standard drinks of alcohol per week. He reports that he does not use drugs.  Allergies:  Allergies  Allergen Reactions  . Lisinopril     diarrhea  . Losartan     Fatigue, diarrhea    No medications prior to admission.    No results found for this or any previous visit (from the past 48 hour(s)).  No results found.   Pertinent items are noted in HPI.  Height 5\' 10"  (1.778 m), weight 93  kg.  General appearance: alert, cooperative and appears stated age Head: Normocephalic, without obvious abnormality Neck: no adenopathy and supple, symmetrical, trachea midline Resp: clear to auscultation bilaterally Cardio: regular rate and rhythm, S1, S2 normal, no murmur, click, rub or gallop GI: soft, non-tender; bowel sounds normal; no masses,  no organomegaly Extremities: catching right middle finger Pulses: 2+ and symmetric Skin: Skin color, texture, turgor normal. No rashes or lesions Neurologic: Grossly normal Incision/Wound: na  Assessment/Plan Assessment:  1. Trigger middle finger of right hand    Plan: We have discussed surgical release of the A1 pulley right middle finger. Pre-peri-and postoperative course are discussed along with risk complications. He is aware there is no guarantee to the surgery the possibility of infection recurrence injury to arteries nerves tendons complete week symptoms did not dystrophy. The schedule as an outpatient under regional anesthesia. He would like to proceed.   Daryll Brod 11/26/2019, 5:32 AM

## 2019-11-26 NOTE — Anesthesia Postprocedure Evaluation (Signed)
Anesthesia Post Note  Patient: Ricky Sharp  Procedure(s) Performed: RELEASE TRIGGER FINGER/A-1 PULLEY RIGHT MIDDLE FINGER (Right Hand)     Patient location during evaluation: PACU Anesthesia Type: MAC Level of consciousness: awake and alert Pain management: pain level controlled Vital Signs Assessment: post-procedure vital signs reviewed and stable Respiratory status: spontaneous breathing, nonlabored ventilation, respiratory function stable and patient connected to nasal cannula oxygen Cardiovascular status: stable and blood pressure returned to baseline Postop Assessment: no apparent nausea or vomiting Anesthetic complications: no    Last Vitals:  Vitals:   11/26/19 0957 11/26/19 1000  BP: 132/86 129/83  Pulse: 64 64  Resp: 14 19  Temp:    SpO2: 97% 96%    Last Pain:  Vitals:   11/26/19 0946  TempSrc:   PainSc: Asleep                 Bernadene Garside

## 2019-11-26 NOTE — Anesthesia Procedure Notes (Signed)
Anesthesia Regional Block: Bier block (IV Regional)   Pre-Anesthetic Checklist: ,, timeout performed, Correct Patient, Correct Site, Correct Laterality, Correct Procedure, Correct Position, site marked, Risks and benefits discussed,  Surgical consent,  Pre-op evaluation,  At surgeon's request and post-op pain management  Laterality: Right  Prep: chloraprep       Needles:  Injection technique: Single-shot      Needle Gauge: 22     Additional Needles:   Procedures:,,,,, intact distal pulses, Esmarch exsanguination, single tourniquet utilized, #20gu IV placed  Narrative:  Start time: 11/26/2019 9:24 AM End time: 11/26/2019 9:26 AM  Performed by: Personally

## 2019-11-26 NOTE — Transfer of Care (Signed)
Immediate Anesthesia Transfer of Care Note  Patient: Ricky Sharp  Procedure(s) Performed: RELEASE TRIGGER FINGER/A-1 PULLEY RIGHT MIDDLE FINGER (Right Hand)  Patient Location: PACU  Anesthesia Type:MAC and Bier block  Level of Consciousness: awake, alert  and oriented  Airway & Oxygen Therapy: Patient Spontanous Breathing and Patient connected to face mask oxygen  Post-op Assessment: Report given to RN and Post -op Vital signs reviewed and stable  Post vital signs: Reviewed and stable  Last Vitals:  Vitals Value Taken Time  BP 163/93 11/26/19 0946  Temp    Pulse 61 11/26/19 0950  Resp 15 11/26/19 0950  SpO2 96 % 11/26/19 0950  Vitals shown include unvalidated device data.  Last Pain:  Vitals:   11/26/19 0807  TempSrc: Oral  PainSc: 1       Patients Stated Pain Goal: 3 (91/91/66 0600)  Complications: No apparent anesthesia complications

## 2019-11-26 NOTE — Op Note (Signed)
NAME: Ricky Sharp MEDICAL RECORD NO: [id number] DATE OF BIRTH: [birth year] FACILITY: [facility] LOCATION: [facility] PHYSICIAN: Wynonia Sours, MD   OPERATIVE REPORT   DATE OF PROCEDURE: 11/26/19    PREOPERATIVE DIAGNOSIS:   Stenosing tenosynovitis right middle finger   POSTOPERATIVE DIAGNOSIS:   Same   PROCEDURE:   Release A1 pulley right middle finger   SURGEON: Daryll Brod, M.D.   ASSISTANT: none   ANESTHESIA:  Bier block with sedation and Local   INTRAVENOUS FLUIDS:  Per anesthesia flow sheet.   ESTIMATED BLOOD LOSS:  Minimal.   COMPLICATIONS:  None.   SPECIMENS:  none   TOURNIQUET TIME:    Total Tourniquet Time Documented: Forearm (Right) - 15 minutes Total: Forearm (Right) - 15 minutes    DISPOSITION:  Stable to PACU.   INDICATIONS: Patient is a 58 year old male with history of triggering of his right middle finger.  This is not responded to conservative treatment he is elected undergo surgical release of the A1 pulley.  Preperi-and postoperative course been discussed along with risk complications.  He is aware that there is no guarantee to the surgery the possibility of infection recurrence injury to arteries nerves tendons complete relief symptoms and dystrophy.  Preoperative.  Patient seen extremity marked by both patient and surgeon antibiotic given  OPERATIVE COURSE: Patient is brought to the operating room where form based IV regional anesthetic was carried out without difficulty under the direction the anesthesia department.  He was prepped using ChloraPrep and in supine position with the right arm free.  A 3-minute dry time was allowed timeout taken to confirm patient procedure.  An oblique incision was made over the A1 pulley of the right middle finger.  This carried down through subcutaneous tissue.  Retractors were placed tract the neurovascular bundles radially and ulnarly the A1 pulley was identified.  This was released on its radial aspect  a small incision was made centrally and A2.  Tenosynovial tissue proximally was separated with blunt dissection.  Retractors were then placed retracting the 2 tendons away from each other breaking any adhesions between the 2 tendons.  The wound was copiously irrigated with saline.  The finger was placed through full range of motion no further trigger was noted.  The wound was copiously irrigated with saline and closed with interrupted 4-0 nylon sutures.  Local infiltration of quarter percent bupivacaine without epinephrine was good approximately 8 cc was used.  Sterile compressive dressing with the fingers free was applied.  Deflation of the tourniquet all fingers immediately pink.  He was taken to the recovery room for observation in satisfactory condition.  He will be discharged home to return [place] in 1 week on Tylenol ibuprofen for pain with Ultram for backup breakthrough.   Daryll Brod, MD Electronically signed, 11/26/19

## 2019-11-26 NOTE — Brief Op Note (Signed)
11/26/2019  9:49 AM  PATIENT:  Ricky Sharp  58 y.o. male  PRE-OPERATIVE DIAGNOSIS:  TRIGGER RIGHT MIDDLE FINGER  POST-OPERATIVE DIAGNOSIS:  TRIGGER RIGHT MIDDLE FINGER  PROCEDURE:  Procedure(s) with comments: RELEASE TRIGGER FINGER/A-1 PULLEY RIGHT MIDDLE FINGER (Right) - IV REGIONAL FOREARM BLOCK  SURGEON:  Surgeon(s) and Role:    * Daryll Brod, MD - Primary  PHYSICIAN ASSISTANT:   ASSISTANTS: none   ANESTHESIA:   local, regional and IV sedation  EBL: 60ml BLOOD ADMINISTERED:none  DRAINS: none   LOCAL MEDICATIONS USED:  BUPIVICAINE   SPECIMEN:  No Specimen  DISPOSITION OF SPECIMEN:  N/A  COUNTS:  YES  TOURNIQUET:   Total Tourniquet Time Documented: Forearm (Right) - 15 minutes Total: Forearm (Right) - 15 minutes   DICTATION: .Dragon Dictation  PLAN OF CARE: Discharge to home after PACU  PATIENT DISPOSITION:  PACU - hemodynamically stable.

## 2019-11-27 ENCOUNTER — Encounter: Payer: Self-pay | Admitting: *Deleted

## 2019-11-27 ENCOUNTER — Ambulatory Visit: Payer: BC Managed Care – PPO | Admitting: Allergy

## 2019-11-27 ENCOUNTER — Other Ambulatory Visit: Payer: Self-pay

## 2019-11-27 DIAGNOSIS — J339 Nasal polyp, unspecified: Secondary | ICD-10-CM | POA: Diagnosis not present

## 2019-11-27 DIAGNOSIS — H1010 Acute atopic conjunctivitis, unspecified eye: Secondary | ICD-10-CM

## 2019-11-27 DIAGNOSIS — J4520 Mild intermittent asthma, uncomplicated: Secondary | ICD-10-CM

## 2019-11-27 DIAGNOSIS — J302 Other seasonal allergic rhinitis: Secondary | ICD-10-CM

## 2019-11-27 DIAGNOSIS — J3089 Other allergic rhinitis: Secondary | ICD-10-CM

## 2019-11-27 NOTE — Assessment & Plan Note (Signed)
Stable. No albuterol use.  May use albuterol rescue inhaler 2 puffs or nebulizer every 4 to 6 hours as needed for shortness of breath, chest tightness, coughing, and wheezing. May use albuterol rescue inhaler 2 puffs 5 to 15 minutes prior to strenuous physical activities. Monitor frequency of use.   Will get spirometry at next visit instead of today due to COVID-19 pandemic and trying to minimize any type of aerosolizing procedures at this time in the office.

## 2019-11-27 NOTE — Patient Instructions (Addendum)
Seasonal and perennial allergic rhinoconjunctivitis  Continue Xhance 2 sprays twice a day.   Nasal saline spray (i.e., Simply Saline) or nasal saline lavage (i.e., NeilMed) is recommended as needed and prior to medicated nasal sprays.  May use pazeo eye drop 1 in each eye daily as needed.  May use over the counter antihistamines such as Zyrtec (cetirizine), Claritin (loratadine), Allegra (fexofenadine), or Xyzal (levocetirizine) daily as needed.  Nasal polyps  Read about Dupixent injection   Mild intermittent asthma without complication  Monitor symptoms.   May use albuterol rescue inhaler 2 puffs or nebulizer every 4 to 6 hours as needed for shortness of breath, chest tightness, coughing, and wheezing. May use albuterol rescue inhaler 2 puffs 5 to 15 minutes prior to strenuous physical activities. Monitor frequency of use.   Follow up in 6 months or sooner if needed.

## 2019-11-27 NOTE — Progress Notes (Signed)
Follow Up Note  RE: Ricky DIEBOLD MRN: [id number] DOB: 1961-04-05 Date of Office Visit: 11/27/2019  Referring provider: Elby Showers, MD Primary care provider: Elby Showers, MD  Chief Complaint: Asthma and Allergic Rhinitis   History of Present Illness: I had the pleasure of seeing Ricky Sharp for a follow up visit at the [facility] on 11/27/2019. He is a 58 y.o. male, who is being followed for allergic rhinoconjunctivitis, nasal polyps and asthma. His previous allergy office visit was on 05/23/2019 with Dr. Maudie Mercury. Today is a regular follow up visit.   Seasonal and perennial allergic rhinoconjunctivitis Patient has been doing well and no sinus infections since the last visit. Symptoms are stable with minor flares when around grass clippings but not worse since off injections.  Still on Xhance 2 sprays twice a day and using Pazeo eye drops as needed. No nosebleeds.   Nasal polyp Symptoms unchanged and has his baseline perennial nasal congestion. No previous polypectomy.   Mild intermittent asthma without complication Denies any SOB, coughing, wheezing, chest tightness, nocturnal awakenings, ER/urgent care visits or prednisone use since the last visit. No albuterol use since the last visit.  Assessment and Plan: Ricky Sharp is a 58 y.o. male with: Mild intermittent asthma without complication Stable. No albuterol use.  May use albuterol rescue inhaler 2 puffs or nebulizer every 4 to 6 hours as needed for shortness of breath, chest tightness, coughing, and wheezing. May use albuterol rescue inhaler 2 puffs 5 to 15 minutes prior to strenuous physical activities. Monitor frequency of use.   Will get spirometry at next visit instead of today due to COVID-19 pandemic and trying to minimize any type of aerosolizing procedures at this time in the office.   Seasonal and perennial allergic rhinoconjunctivitis Past history - Patient used to be on allergy  immunotherapy in the past and did not notice any worsening symptoms since cessation. ENT evaluation found right nasal polyp - no surgery yet.   Interim history - symptoms stable with below regimen.    Continue Xhance 2 sprays twice a day.   Nasal saline spray (i.e., Simply Saline) or nasal saline lavage (i.e., NeilMed) is recommended as needed and prior to medicated nasal sprays.  May use Pazeo eye drop 1 in each eye daily as needed.  May use over the counter antihistamines such as Zyrtec (cetirizine), Claritin (loratadine), Allegra (fexofenadine), or Xyzal (levocetirizine) daily as needed.  If above regimen does not control symptoms then will consider retesting and restarting allergy immunotherapy.   Nasal polyp  See assessment and plan as above for allergic rhinitis.   Patient's insurance would not cover polypectomy surgery.  Still has his baseline nasal congestion and diminished sense of smell.   Gave handout on Dupixent - he may be a good candidate if symptoms persistent/worsen.   Return in about 6 months (around 05/27/2020).  Diagnostics: None.  Medication List:  Current Outpatient Medications  Medication Sig Dispense Refill  . ALPRAZolam (XANAX) 0.25 MG tablet Take 1 tablet (0.25 mg total) by mouth 2 (two) times daily as needed for anxiety. 60 tablet 3  . DILT-XR 240 MG 24 hr capsule TAKE (1) CAPSULE DAILY. 90 capsule 3  . doxazosin (CARDURA) 2 MG tablet Take 1 tablet (2 mg total) by mouth at bedtime. 30 tablet 11  . escitalopram (LEXAPRO) 10 MG tablet TAKE (1/2) TABLET DAILY. 15 tablet 0  . Fluticasone Propionate (XHANCE) 93 MCG/ACT EXHU Place 2 sprays into both nostrils  2 (two) times daily. 32 mL 4  . hydrochlorothiazide (HYDRODIURIL) 25 MG tablet TAKE 1 TABLET BY MOUTH DAILY. 90 tablet 2  . ibuprofen (ADVIL,MOTRIN) 200 MG tablet Take 200 mg by mouth every 6 (six) hours as needed.      Marland Kitchen levothyroxine (SYNTHROID) 112 MCG tablet TAKE 1 TABLET ONCE DAILY. 90 tablet 0  .  Olopatadine HCl (PAZEO) 0.7 % SOLN Place 1 drop into both eyes daily as needed (itchy eyes). 1 Bottle 5  . potassium chloride (KLOR-CON) 10 MEQ tablet TAKE (2) TABLETS TWICE DAILY. 360 tablet 2  . rosuvastatin (CRESTOR) 5 MG tablet TAKE 1 TABLET ONCE DAILY. 90 tablet 3  . traMADol (ULTRAM) 50 MG tablet Take 1 tablet (50 mg total) by mouth every 6 (six) hours as needed. 20 tablet 0   No current facility-administered medications for this visit.   Allergies: Allergies  Allergen Reactions  . Lisinopril     diarrhea  . Losartan     Fatigue, diarrhea   I reviewed his past medical history, social history, family history, and environmental history and no significant changes have been reported from his previous visit.  Review of Systems  Constitutional: Negative for appetite change, chills, fever and unexpected weight change.  HENT: Positive for congestion. Negative for rhinorrhea.   Eyes: Negative for itching.  Respiratory: Negative for cough, chest tightness, shortness of breath and wheezing.   Gastrointestinal: Negative for abdominal pain.  Skin: Negative for rash.  Allergic/Immunologic: Positive for environmental allergies.  Neurological: Negative for headaches.   Objective: There were no vitals taken for this visit. There is no height or weight on file to calculate BMI. Physical Exam  Constitutional: He is oriented to person, place, and time. He appears well-developed and well-nourished.  HENT:  Head: Normocephalic and atraumatic.  Right Ear: External ear normal.  Left Ear: External ear normal.  Nose: Nose normal.  Mouth/Throat: Oropharynx is clear and moist.  Eyes: Conjunctivae and EOM are normal.  Cardiovascular: Normal rate, regular rhythm and normal heart sounds. Exam reveals no gallop and no friction rub.  No murmur heard. Pulmonary/Chest: Effort normal and breath sounds normal. He has no wheezes. He has no rales.  Musculoskeletal:     Cervical back: Neck supple.    Neurological: He is alert and oriented to person, place, and time.  Skin: Skin is warm. No rash noted.  Psychiatric: He has a normal mood and affect. His behavior is normal.  Nursing note and vitals reviewed.  Previous notes and tests were reviewed. The plan was reviewed with the patient/family, and all questions/concerned were addressed.  It was my pleasure to see Ricky Sharp today and participate in his care. Please feel free to contact me with any questions or concerns.  Sincerely,  Rexene Alberts, DO Allergy & Immunology  [facility] office: [phone] [facility] office: [place] office: [phone]

## 2019-11-27 NOTE — Assessment & Plan Note (Signed)
   See assessment and plan as above for allergic rhinitis.   Patient's insurance would not cover polypectomy surgery.  Still has his baseline nasal congestion and diminished sense of smell.   Gave handout on Dupixent - he may be a good candidate if symptoms persistent/worsen.

## 2019-11-27 NOTE — Assessment & Plan Note (Signed)
Past history - Patient used to be on allergy immunotherapy in the past and did not notice any worsening symptoms since cessation. ENT evaluation found right nasal polyp - no surgery yet.   Interim history - symptoms stable with below regimen.    Continue Xhance 2 sprays twice a day.   Nasal saline spray (i.e., Simply Saline) or nasal saline lavage (i.e., NeilMed) is recommended as needed and prior to medicated nasal sprays.  May use Pazeo eye drop 1 in each eye daily as needed.  May use over the counter antihistamines such as Zyrtec (cetirizine), Claritin (loratadine), Allegra (fexofenadine), or Xyzal (levocetirizine) daily as needed.  If above regimen does not control symptoms then will consider retesting and restarting allergy immunotherapy.

## 2019-12-23 ENCOUNTER — Other Ambulatory Visit: Payer: Self-pay | Admitting: Internal Medicine

## 2020-01-16 ENCOUNTER — Telehealth: Payer: Self-pay | Admitting: Internal Medicine

## 2020-01-16 NOTE — Telephone Encounter (Signed)
Called and spoke with [name] about recent notes we received from Dermatology that he was having some blood pressure issues.. I let him know that Dr Renold Genta wants him to call and schedule appointment to discuss blood pressure issues with cardiologist Patient verbalized understanding.

## 2020-01-22 ENCOUNTER — Other Ambulatory Visit: Payer: Self-pay | Admitting: Internal Medicine

## 2020-02-08 IMAGING — MR MRA HEAD WITHOUT CONTRAST
4 series · 27 of 48 positions shown · IV contrast (multihance)
Comparison: None.

CLINICAL DATA: Left-sided pulsatile tinnitus for 6 months.

EXAM:
MRA NECK WITHOUT AND WITH CONTRAST
MRA HEAD WITHOUT CONTRAST
TECHNIQUE: Multiplanar and multiecho pulse sequences of the neck were obtained
without and with intravenous contrast. Angiographic images of the
neck were obtained using MRA technique without and with intravenous
contast.; Angiographic images of the Circle of Willis were obtained
using MRA technique without intravenous contrast.
CONTRAST:  20mL MULTIHANCE GADOBENATE DIMEGLUMINE 529 MG/ML IV SOLN

[Series 3: tof_3d_multi-slab · axial · 0.7mm · 0.35mm/px · z∈[-60,+34]mm · 9 of 156 slices shown]
[im 9/156]
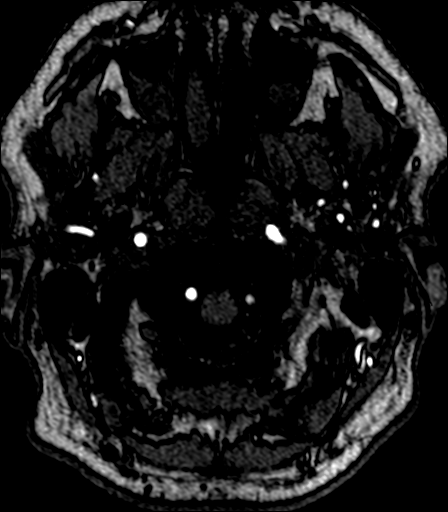
[im 25/156]
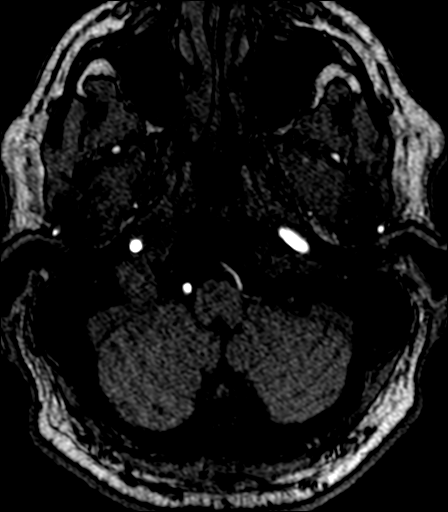
[im 49/156]
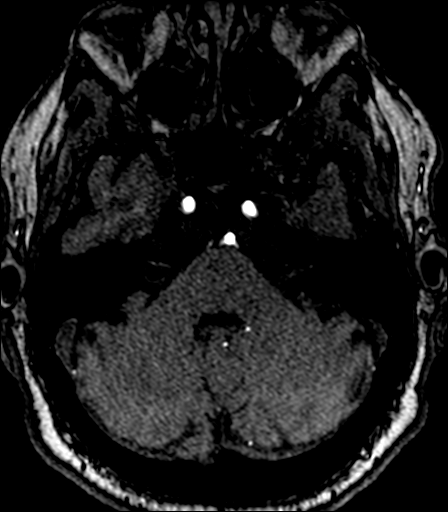
[im 66/156]
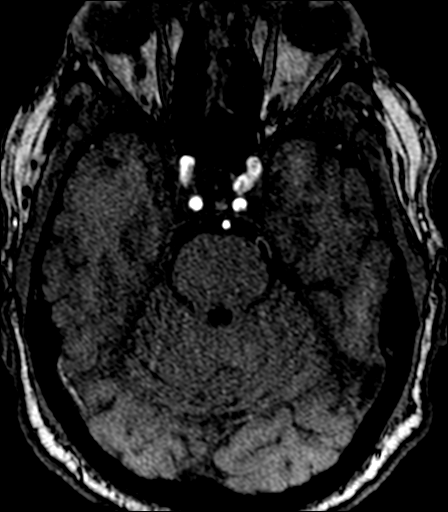
[im 82/156]
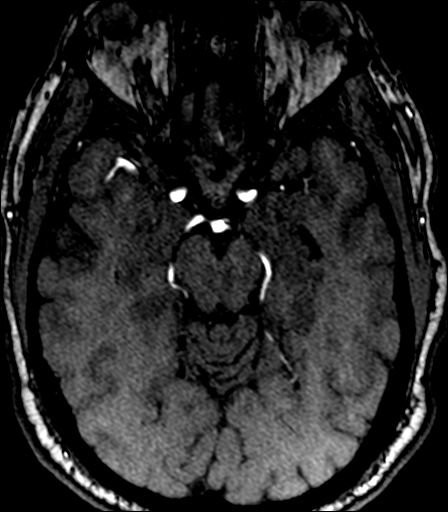
[im 90/156]
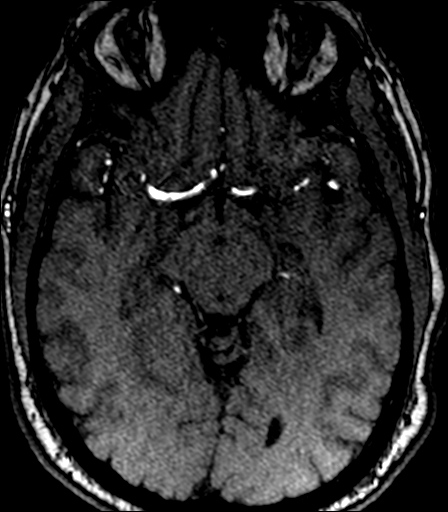
[im 107/156]
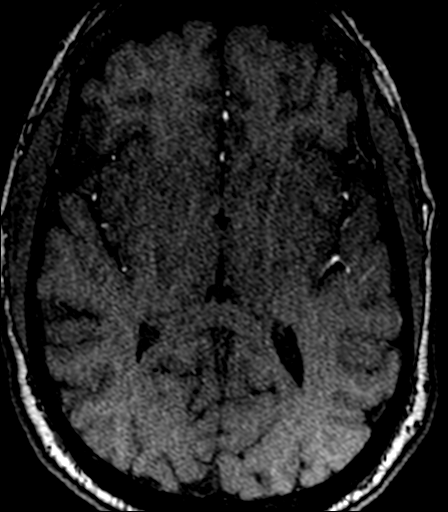
[im 131/156]
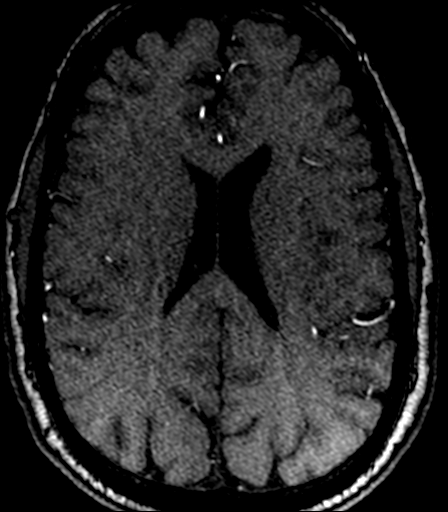
[im 147/156]
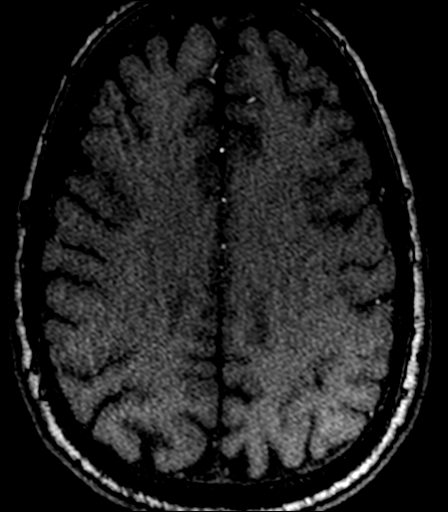

[Series 10: fl_tof_2d · axial · 3.0mm · 0.39mm/px · z∈[-211,-113]mm · 7 of 50 slices shown]
[im 1/50]
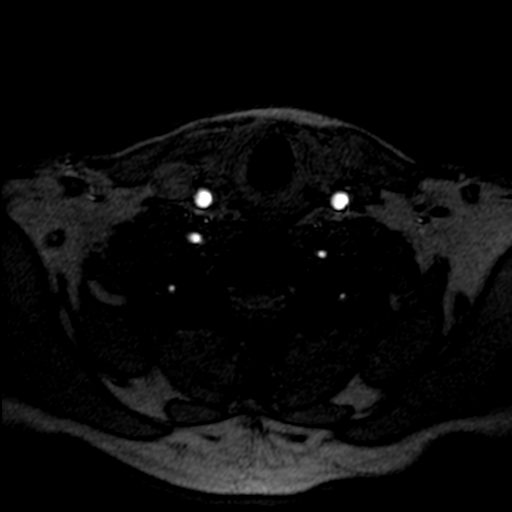
[im 9/50]
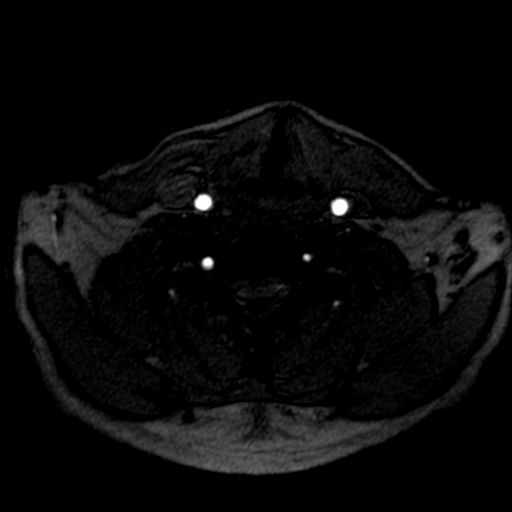
[im 17/50]
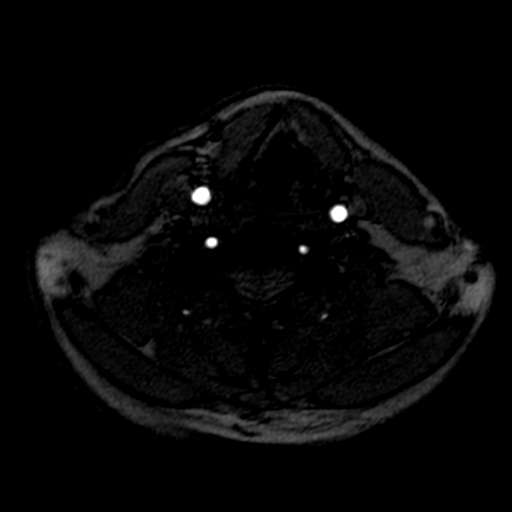
[im 25/50]
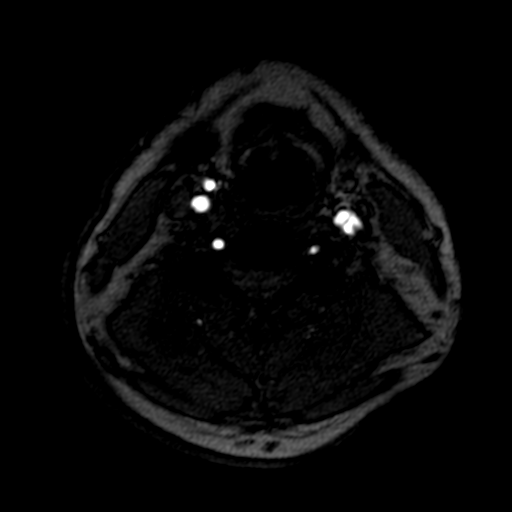
[im 33/50]
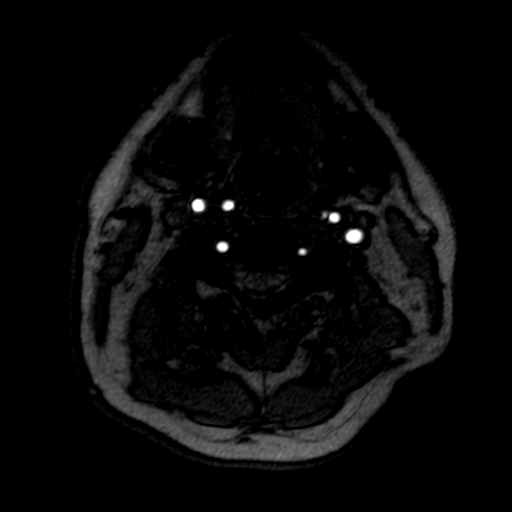
[im 41/50]
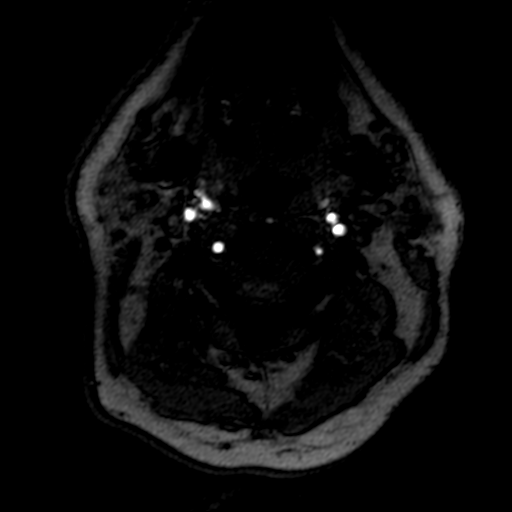
[im 50/50]
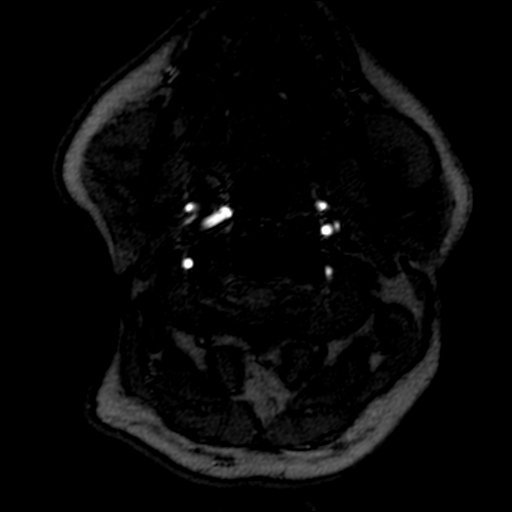

[Series 14: (id)_tt=1.0s · coronal · 0.8mm · 0.78mm/px · 8 of 80 slices shown (1 of 2)]
[im 1/80]
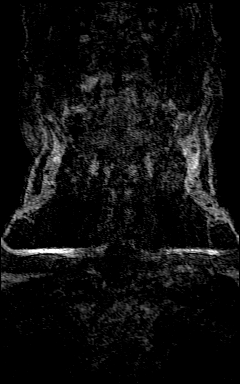
[im 8/80]
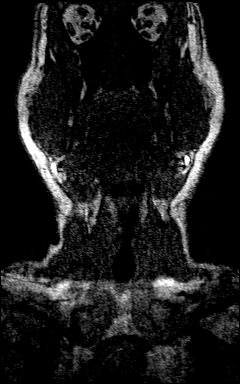
[im 16/80]
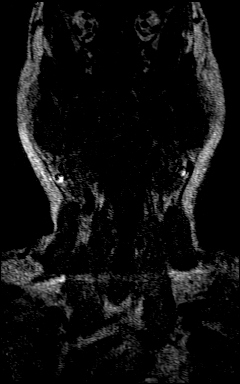
[im 24/80]
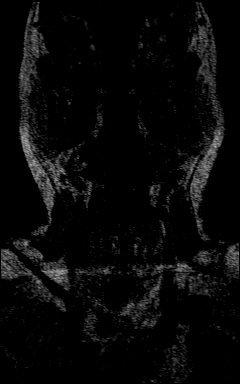
[im 32/80]
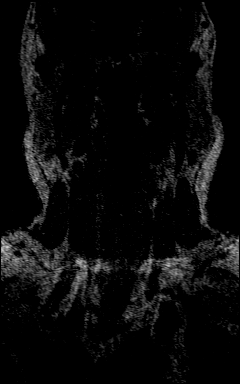
[im 40/80]
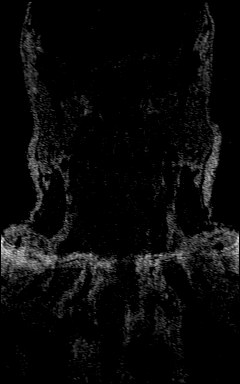
[im 48/80]
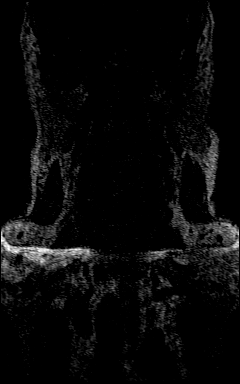
[im 72/80]
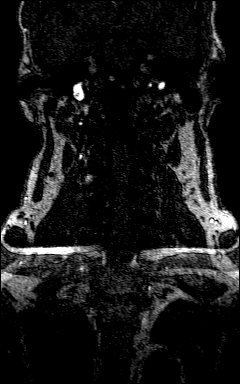

[Series 16: (id)_tt=1.0s · coronal · 0.8mm · 0.78mm/px · 3 of 75 slices shown (2 of 2)]
[im 9/75]
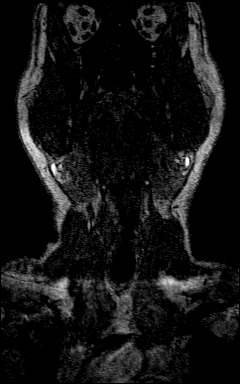
[im 42/75]
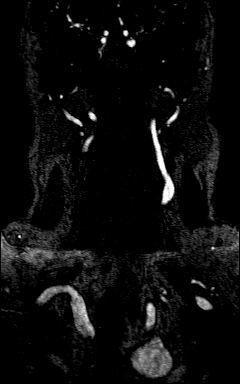
[im 66/75]
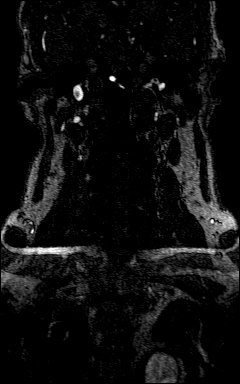

[27 of 48 positions shown; findings below may reference images not displayed]

FINDINGS: MRA NECK FINDINGS

There is a standard 3 vessel aortic arch. The brachiocephalic and
subclavian arteries are widely patent. The cervical carotid arteries
are patent without evidence of stenosis or dissection. The vertebral
arteries are patent with antegrade flow bilaterally and no evidence
of significant stenosis or dissection. The right vertebral artery is
strongly dominant.

MRA HEAD FINDINGS

The intracranial vertebral arteries are patent to the basilar.
Patent AICA and SCA origins are visualized bilaterally with the
right AICA appearing duplicated. The basilar artery is widely
patent. There is a fetal origin of the left PCA. Both PCAs are
patent without evidence of significant stenosis.

The internal carotid arteries are widely patent from skull base to
carotid termini. A 3 mm aneurysm projects laterally from the
horizontal portion of the right cavernous ICA. ACAs and MCAs are
patent without evidence of proximal branch occlusion or significant
proximal stenosis. The right A1 segment is mildly dominant. The
anterior communicating artery is unremarkable. No aneurysm or
vascular malformation is identified.
IMPRESSION: 1. 3 mm right cavernous ICA aneurysm.
2. Otherwise negative head MRA and neck MRA.

## 2020-03-04 ENCOUNTER — Ambulatory Visit: Payer: BC Managed Care – PPO | Admitting: Cardiovascular Disease

## 2020-03-12 ENCOUNTER — Other Ambulatory Visit: Payer: Self-pay

## 2020-03-12 MED ORDER — XHANCE 93 MCG/ACT NA EXHU: 2.0000 | INHALANT_SUSPENSION | Freq: Two times a day (BID) | NASAL | 5 refills | Status: DC

## 2020-03-19 NOTE — Progress Notes (Signed)
Cardiology Office Note   Date:  03/19/2020   ID:  Ricky Sharp, DOB 16-Jan-1961, MRN [id number]  PCP:  Elby Showers, MD  Cardiologist:   Mertie Moores, MD  Chief Complaint  Patient presents with  . Hypertension      History of Present Illness: Ricky Sharp is a 59 y.o. male who presents for HTN  1. Hypertension 2. Anxiety - stress related 3. GERD    Patient is a 59 year old gentleman with a history of hypertension. He's tried several medications in the past that have not completely controlled his blood pressure.  - He has tried Losartan but had some dizziness / dry mouth.  - tried bystolic 5 mg - felt poorly, persistant head ache, diarrhea, mild respiratory distress. Tried for a week  He has also tried some antianxiety meds but had various reactions.  He has multiple family and life stresses.   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 He is also having some family health issues that require him to spend lots of time with them.  He has felt well on his HCTZ and potassium since his last visit. He has not been exercising as much as he would like.  Oct. 1, 2014:  Ricky Sharp is a 59 yo with hx of HTN. He has not been exercising. He has gained 10 lbs. He has tried Losartan but it caused vertigo. He tried beta blockers but had severe fatigue and slow HR. He does not want to start any new meds. He knows that his weight is up and that is BP has been elevated.   March 04, 2014:  Ricky Sharp has been diagnosed with hypothyroidism since I last saw him. He is feeling somewhat better.  Diastolic BP has still been a bit high. Still eating some salt. BP at home is typically is the 80s - occasionally upper 80s.   Oct. 1, 2015:  Ricky Sharp is now off the Losartan - was having lots of fatigue and diarrhea. Symptoms are better. On HCTZ and blood pressures are well  controlled. Rides his bike on occasion. He also is taking Judo . Not Having any episodes of chest pain BP has been ok  Jan. 21, 2016:  Ricky Sharp has had HTN that has been difficult to manage.  He developed GI complaints with Losartan and with Lisinopril.  He had significant diarrhea on the Lisinopril - has resolved.   April 03, 2015:  Ricky Sharp has been seen in the hypertension clinic for the past several months. His blood pressure is now well controlled. No CP , no dyspnea. Still has anxiety.   April 04, 2016: Ricky Sharp is doing well  Weight is up a little  Mother died , lots of stress.  Amlodipine still causes diarrhea - ( like clockwork)   May 18, 2017  BP has been well controled at home.   Still eating a little more salt than he should. Not as active.   Weight is staying elevated.  Mother died 2 years ago and he had a struggle working through the estate.  No exercising   Jan. 8, 2018:  Ricky Sharp is seen today for follow up of his HTN. Exercising some.  Hoping to get out more . 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       December 20, 2017:  Ricky Sharp, seen today for further evaluation and management of his hypertension. No  CP or dyspnea . Eats some salt,  Not much  Does not exercise  - HDL is 28 Had a reaction to Losartan and Lisinopril   March 20, 2020 Ricky Sharp is see back for follow up of his HTN and HLD Has had both covid vaccines.  Has gained a little weight.   Moderately active.  Has lack of energy . Stressed about his fathers death .  Was found to have a cerebral aneurism. - incidentally noted on a head scan    Wt Readings from Last 3 Encounters:  11/26/19 226 lb 3.1 oz (102.6 kg)  08/29/19 212 lb 8 oz (96.4 kg)  07/26/19 221 lb (100.2 kg)     Past Medical History:  Diagnosis Date  . Anxiety   . Eczema   . Hypertension   . Hypothyroidism   . Recurrent upper respiratory infection (URI)   . Sleep apnea    borderline, uses dental device     Past Surgical History:  Procedure Laterality Date  . CHOLECYSTECTOMY    . OSTEOCHONDROMA EXCISION    . TONSILLECTOMY    . TRIGGER FINGER RELEASE Right 11/26/2019   Procedure: RELEASE TRIGGER FINGER/A-1 PULLEY RIGHT MIDDLE FINGER;  Surgeon: Daryll Brod, MD;  Location: [facility];  Service: Orthopedics;  Laterality: Right;  IV REGIONAL FOREARM BLOCK  . WISDOM TOOTH EXTRACTION       Current Outpatient Medications  Medication Sig Dispense Refill  . ALPRAZolam (XANAX) 0.25 MG tablet Take 1 tablet (0.25 mg total) by mouth 2 (two) times daily as needed for anxiety. 60 tablet 3  . DILT-XR 240 MG 24 hr capsule TAKE (1) CAPSULE DAILY. 90 capsule 3  . doxazosin (CARDURA) 2 MG tablet Take 1 tablet (2 mg total) by mouth at bedtime. 30 tablet 11  . escitalopram (LEXAPRO) 10 MG tablet TAKE (1/2) TABLET DAILY. 45 tablet 3  . Fluticasone Propionate (XHANCE) 93 MCG/ACT EXHU Place 2 sprays into both nostrils 2 (two) times daily. 32 mL 5  . hydrochlorothiazide (HYDRODIURIL) 25 MG tablet TAKE 1 TABLET BY MOUTH DAILY. 90 tablet 2  . levothyroxine (SYNTHROID) 112 MCG tablet TAKE 1 TABLET ONCE DAILY. 90 tablet 0  . potassium chloride (KLOR-CON) 10 MEQ tablet TAKE (2) TABLETS TWICE DAILY. 360 tablet 2  . rosuvastatin (CRESTOR) 5 MG tablet TAKE 1 TABLET ONCE DAILY. 90 tablet 3   No current facility-administered medications for this visit.    Allergies:   Lisinopril and Losartan    Social History:  The patient  reports that he has never smoked. He has never used smokeless tobacco. He reports current alcohol use of about 2.0 standard drinks of alcohol per week. He reports that he does not use drugs.   Family History:  The patient's family history includes Fibromyalgia in his mother; Lung disease in his mother; Syncope episode in his mother. He was adopted.   ROS:   Discussed in H&P.  Otherwise negative   Physical Exam: There were no vitals taken for this visit.  GEN:  Well nourished,  well developed in no acute distress HEENT: Normal NECK: No JVD; No carotid bruits LYMPHATICS: No lymphadenopathy CARDIAC: RRR , no murmurs, rubs, gallops RESPIRATORY:  Clear to auscultation without rales, wheezing or rhonchi  ABDOMEN: Soft, non-tender, non-distended MUSCULOSKELETAL:  No edema; No deformity  SKIN: Warm and dry NEUROLOGIC:  Alert and oriented x 3      EKG:      Recent Labs: 05/27/2019: ALT 46; Hemoglobin 16.7; Platelets 276; TSH 1.58  11/22/2019: BUN 17; Creatinine, Ser 1.27; Potassium 3.8; Sodium 139    Lipid Panel    Component Value Date/Time   CHOL 116 05/27/2019 1158   TRIG 196 (H) 05/27/2019 1158   HDL 31 (L) 05/27/2019 1158   CHOLHDL 3.7 05/27/2019 1158   VLDL 61 (H) 12/13/2016 1135   LDLCALC 58 05/27/2019 1158      Wt Readings from Last 3 Encounters:  11/26/19 226 lb 3.1 oz (102.6 kg)  08/29/19 212 lb 8 oz (96.4 kg)  07/26/19 221 lb (100.2 kg)      Other studies Reviewed: Additional studies/ records that were reviewed today include: . Review of the above records demonstrates:    ASSESSMENT AND PLAN:  1.  Essential  Hypertension:   BP is generally well controlled. .    Advised additional exercise and to watch his salt intake   2.  Hyperlipidemia :    Labs from June, 2020 reviewed.   LDL is 58.  Cont diet exercise,  Rosuvastatin 5 mg a day    3.  Cerebral aneurism:  Incidentally discovered.   Follow up with neurosurgery / neurology   Current medicines are reviewed at length with the patient today.  The patient does not have concerns regarding medicines.  The following changes have been made:      Labs/ tests ordered today include:   No orders of the defined types were placed in this encounter.   Disposition:   FU with me  In  1 year    Signed, Mertie Moores, MD  03/19/2020 8:31 PM    [facility] [place], [place], [place]  [address] Phone: [phone]; Fax: [phone]

## 2020-03-20 ENCOUNTER — Encounter: Payer: Self-pay | Admitting: Cardiovascular Disease

## 2020-03-20 ENCOUNTER — Ambulatory Visit: Payer: BC Managed Care – PPO | Admitting: Cardiovascular Disease

## 2020-03-20 ENCOUNTER — Other Ambulatory Visit: Payer: Self-pay

## 2020-03-20 VITALS — BP 140/84 | HR 65 | Ht 70.5 in | Wt 215.8 lb

## 2020-03-20 DIAGNOSIS — E782 Mixed hyperlipidemia: Secondary | ICD-10-CM | POA: Diagnosis not present

## 2020-03-20 DIAGNOSIS — I10 Essential (primary) hypertension: Secondary | ICD-10-CM

## 2020-03-20 NOTE — Patient Instructions (Signed)

## 2020-03-25 ENCOUNTER — Other Ambulatory Visit: Payer: Self-pay | Admitting: Internal Medicine

## 2020-04-17 ENCOUNTER — Other Ambulatory Visit: Payer: Self-pay | Admitting: Internal Medicine

## 2020-07-20 ENCOUNTER — Other Ambulatory Visit: Payer: Self-pay | Admitting: Internal Medicine

## 2020-07-20 NOTE — Telephone Encounter (Signed)
It's been over a year since his CPE. Please book CPE and refill until then

## 2020-07-21 NOTE — Telephone Encounter (Signed)
Left detailed message.   

## 2020-07-24 ENCOUNTER — Other Ambulatory Visit: Payer: Self-pay

## 2020-07-24 MED ORDER — LEVOTHYROXINE SODIUM 112 MCG PO TABS: 112.0000 ug | ORAL_TABLET | Freq: Every day | ORAL | 0 refills | Status: DC

## 2020-07-28 ENCOUNTER — Other Ambulatory Visit: Payer: Self-pay | Admitting: Neurosurgery

## 2020-07-28 DIAGNOSIS — I671 Cerebral aneurysm, nonruptured: Secondary | ICD-10-CM

## 2020-07-30 ENCOUNTER — Other Ambulatory Visit: Payer: Self-pay | Admitting: Allergy

## 2020-08-02 ENCOUNTER — Other Ambulatory Visit: Payer: Self-pay | Admitting: Allergy

## 2020-08-10 ENCOUNTER — Ambulatory Visit
Admission: RE | Admit: 2020-08-10 | Discharge: 2020-08-10 | Disposition: A | Discharge status: Home / Self Care | Payer: BC Managed Care – PPO | Source: Ambulatory Visit | Attending: Neurosurgery | Admitting: Neurosurgery

## 2020-08-10 ENCOUNTER — Other Ambulatory Visit: Payer: Self-pay

## 2020-08-10 DIAGNOSIS — I671 Cerebral aneurysm, nonruptured: Secondary | ICD-10-CM

## 2020-08-10 MED ORDER — IOPAMIDOL (ISOVUE-370) INJECTION 76%
75.0000 mL | Freq: Once | INTRAVENOUS | Status: AC | PRN
  Administered 2020-08-10: 75 mL via INTRAVENOUS

## 2020-08-25 ENCOUNTER — Other Ambulatory Visit: Payer: Self-pay | Admitting: Allergy

## 2020-09-03 ENCOUNTER — Other Ambulatory Visit: Payer: Self-pay | Admitting: Cardiovascular Disease

## 2020-09-04 ENCOUNTER — Other Ambulatory Visit: Payer: BC Managed Care – PPO | Admitting: Internal Medicine

## 2020-09-04 ENCOUNTER — Other Ambulatory Visit: Payer: Self-pay

## 2020-09-04 DIAGNOSIS — E782 Mixed hyperlipidemia: Secondary | ICD-10-CM

## 2020-09-04 DIAGNOSIS — E039 Hypothyroidism, unspecified: Secondary | ICD-10-CM

## 2020-09-04 DIAGNOSIS — E781 Pure hyperglyceridemia: Secondary | ICD-10-CM

## 2020-09-04 DIAGNOSIS — I10 Essential (primary) hypertension: Secondary | ICD-10-CM

## 2020-09-04 DIAGNOSIS — Z125 Encounter for screening for malignant neoplasm of prostate: Secondary | ICD-10-CM

## 2020-09-04 DIAGNOSIS — G4733 Obstructive sleep apnea (adult) (pediatric): Secondary | ICD-10-CM

## 2020-09-04 LAB — COMPLETE METABOLIC PANEL WITH GFR
AG Ratio: 1.7 (calc) (ref 1.0–2.5)
ALT: 41 U/L (ref 9–46)
AST: 25 U/L (ref 10–35)
Albumin: 4.5 g/dL (ref 3.6–5.1)
Alkaline phosphatase (APISO): 62 U/L (ref 35–144)
BUN: 17 mg/dL (ref 7–25)
CO2: 28 mmol/L (ref 20–32)
Calcium: 9.8 mg/dL (ref 8.6–10.3)
Chloride: 101 mmol/L (ref 98–110)
Creat: 1.11 mg/dL (ref 0.70–1.25)
GFR, Est African American: 83 mL/min/{1.73_m2} (ref >=60)
GFR, Est Non African American: 72 mL/min/{1.73_m2} (ref >=60)
Globulin: 2.6 g/dL (calc) (ref 1.9–3.7)
Glucose, Bld: 92 mg/dL (ref 65–99)
Potassium: 3.9 mmol/L (ref 3.5–5.3)
Sodium: 138 mmol/L (ref 135–146)
Total Bilirubin: 0.7 mg/dL (ref 0.2–1.2)
Total Protein: 7.1 g/dL (ref 6.1–8.1)

## 2020-09-04 LAB — CBC WITH DIFFERENTIAL/PLATELET
Absolute Monocytes: 599 cells/uL (ref 200–950)
Basophils Absolute: 50 cells/uL (ref 0–200)
Basophils Relative: 0.9 %
Eosinophils Absolute: 162 cells/uL (ref 15–500)
Eosinophils Relative: 2.9 %
HCT: 50.6 % — ABNORMAL HIGH (ref 38.5–50.0)
Hemoglobin: 17.3 g/dL — ABNORMAL HIGH (ref 13.2–17.1)
Lymphs Abs: 1355 cells/uL (ref 850–3900)
MCH: 30.2 pg (ref 27.0–33.0)
MCHC: 34.2 g/dL (ref 32.0–36.0)
MCV: 88.3 fL (ref 80.0–100.0)
MPV: 10.5 fL (ref 7.5–12.5)
Monocytes Relative: 10.7 %
Neutro Abs: 3433 cells/uL (ref 1500–7800)
Neutrophils Relative %: 61.3 %
Platelets: 239 10*3/uL (ref 140–400)
RBC: 5.73 10*6/uL (ref 4.20–5.80)
RDW: 13.3 % (ref 11.0–15.0)
Total Lymphocyte: 24.2 %
WBC: 5.6 10*3/uL (ref 3.8–10.8)

## 2020-09-04 LAB — LIPID PANEL
Cholesterol: 127 mg/dL (ref <200)
HDL: 32 mg/dL — ABNORMAL LOW (ref >=40)
LDL Cholesterol (Calc): 68 mg/dL (calc)
Non-HDL Cholesterol (Calc): 95 mg/dL (calc) (ref <130)
Total CHOL/HDL Ratio: 4 (calc) (ref <5.0)
Triglycerides: 197 mg/dL — ABNORMAL HIGH (ref <150)

## 2020-09-04 LAB — TSH: TSH: 2.21 mIU/L (ref 0.40–4.50)

## 2020-09-04 LAB — PSA: PSA: 0.18 ng/mL (ref <=4.0)

## 2020-09-07 ENCOUNTER — Ambulatory Visit (INDEPENDENT_AMBULATORY_CARE_PROVIDER_SITE_OTHER): Payer: BC Managed Care – PPO | Admitting: Internal Medicine

## 2020-09-07 ENCOUNTER — Other Ambulatory Visit: Payer: Self-pay

## 2020-09-07 ENCOUNTER — Encounter: Payer: Self-pay | Admitting: Internal Medicine

## 2020-09-07 VITALS — BP 130/70 | HR 68 | Ht 70.5 in | Wt 215.0 lb

## 2020-09-07 DIAGNOSIS — I10 Essential (primary) hypertension: Secondary | ICD-10-CM | POA: Diagnosis not present

## 2020-09-07 DIAGNOSIS — Z0000 Encounter for general adult medical examination without abnormal findings: Secondary | ICD-10-CM

## 2020-09-07 DIAGNOSIS — E782 Mixed hyperlipidemia: Secondary | ICD-10-CM

## 2020-09-07 DIAGNOSIS — Z23 Encounter for immunization: Secondary | ICD-10-CM

## 2020-09-07 DIAGNOSIS — G4733 Obstructive sleep apnea (adult) (pediatric): Secondary | ICD-10-CM | POA: Diagnosis not present

## 2020-09-07 DIAGNOSIS — F439 Reaction to severe stress, unspecified: Secondary | ICD-10-CM

## 2020-09-07 DIAGNOSIS — E039 Hypothyroidism, unspecified: Secondary | ICD-10-CM

## 2020-09-07 DIAGNOSIS — E786 Lipoprotein deficiency: Secondary | ICD-10-CM

## 2020-09-07 DIAGNOSIS — J301 Allergic rhinitis due to pollen: Secondary | ICD-10-CM

## 2020-09-07 DIAGNOSIS — Z8709 Personal history of other diseases of the respiratory system: Secondary | ICD-10-CM

## 2020-09-07 LAB — POCT URINALYSIS DIPSTICK
Appearance: NEGATIVE
Bilirubin, UA: NEGATIVE
Blood, UA: NEGATIVE
Glucose, UA: NEGATIVE
Ketones, UA: NEGATIVE
Leukocytes, UA: NEGATIVE
Nitrite, UA: NEGATIVE
Odor: NEGATIVE
Protein, UA: NEGATIVE
Spec Grav, UA: 1.015 (ref 1.010–1.025)
Urobilinogen, UA: 0.2 E.U./dL
pH, UA: 6.5 (ref 5.0–8.0)

## 2020-09-07 NOTE — Progress Notes (Signed)
   Subjective:    Patient ID: Ricky Sharp, male    DOB: [birth year], 60 y.o.   MRN: [id number]  HPI 59 year old Male seen for health maintenance exam and evaluation of medical issues.  He has a history of hypothyroidism, allergic rhinitis, GE reflux, excessive daytime sleepiness, anxiety and depression, asthma, hypertension.  Also history of impaired glucose tolerance.  Has CPAP machine for obstructive sleep apnea.  Followed by Dr. Acie Fredrickson for hypertension.  In July 2017 he had acute diverticulitis of the sigmoid colon treated with antibiotics.  In April 2020, he had a E. coli urinary tract infection treated with Cipro.  Cholecystectomy 2004.  Negative myocardial perfusion study in 2003.  He was admitted to the hospital with chest and epigastric pain.  Upper endoscopy was performed and he was diagnosed with peptic ulcer disease secondary to H. pylori infection.  MI was ruled out at the time.  Has perennial and seasonal rhinitis.  Tends to wheeze with respiratory infections.  He has seen allergist.  Stopped allergy injections in 2018.  Has seen Dr. [name] for hypersomnia.  Additional past medical history: Fracture left clavicle 1981, wisdom teeth extracted 1992, osteochondroma removed from right tibia in 1993.  Kidney stone in 2003.  Laceration of right hand August 2017 requiring sutures.  History of positive hepatitis B antibody with history of hepatitis in 1966 and was never immunized for Hepatitis B.    Review of Systems  Respiratory: Negative.   Cardiovascular: Negative.   Gastrointestinal: Negative.   Genitourinary: Negative.   Neurological: Negative.     situational stress at home.  2 daughters who have been living in [place] doing well.  Oldest daughter will be attending graduate school in [place].     Objective:   Physical Exam Blood pressure 130/70 pulse 68 regular pulse oximetry 97% weight 215 pounds BMI 30.41 Skin warm and dry.  No cervical adenopathy.  TMs  clear.  Neck supple.  Chest clear to auscultation.  Cardiac exam regular rate and rhythm.  Abdomen soft nondistended without hepatosplenomegaly masses or tenderness.  No lower extremity edema.  Prostate is normal.  Neuro intact without focal deficits.     Assessment & Plan:  BMI 30.41.  He could lose a bit of weight.  Has not been able to exercise much recently.  Essential hypertension-continue current regimen  GE reflux treated with PPI and remote history of H. pylori a number of years ago  Hypersomnia  Sleep apnea treated with CPAP  Hyperlipidemia treated with statin  Anxiety depression treated with SSRI  Hypothyroidism treated with thyroid replacement  Situational stress  Plan: Triglycerides are elevated at 197.  Currently on rosuvastatin 5 mg daily.  He would prefer not to go up on this dose at present time.  TSH stable on levothyroxine.  Blood pressure stable on HCTZ, diltiazem 240 mg XR and Cardura.  Recommend follow-up in 6 months.  Flu vaccine given.  Have third Covid booster when available.  Tetanus immunization is up-to-date.

## 2020-09-09 NOTE — Patient Instructions (Addendum)
Flu vaccine given.  Continue to work on diet and exercise.  Follow-up in 6 months.  It was a pleasure to see you today.

## 2020-09-12 ENCOUNTER — Other Ambulatory Visit: Payer: Self-pay | Admitting: Cardiovascular Disease

## 2020-09-15 ENCOUNTER — Other Ambulatory Visit: Payer: Self-pay | Admitting: Cardiovascular Disease

## 2020-09-16 ENCOUNTER — Other Ambulatory Visit: Payer: Self-pay | Admitting: Allergy

## 2020-09-23 ENCOUNTER — Other Ambulatory Visit: Payer: Self-pay | Admitting: Cardiovascular Disease

## 2020-09-25 ENCOUNTER — Other Ambulatory Visit: Payer: Self-pay | Admitting: Allergy

## 2020-10-02 ENCOUNTER — Other Ambulatory Visit: Payer: Self-pay | Admitting: Cardiovascular Disease

## 2020-10-31 ENCOUNTER — Other Ambulatory Visit: Payer: Self-pay | Admitting: Cardiovascular Disease

## 2020-11-01 ENCOUNTER — Other Ambulatory Visit: Payer: Self-pay | Admitting: Cardiovascular Disease

## 2020-11-30 ENCOUNTER — Other Ambulatory Visit: Payer: Self-pay | Admitting: Cardiovascular Disease

## 2020-11-30 ENCOUNTER — Other Ambulatory Visit: Payer: Self-pay | Admitting: Internal Medicine

## 2020-12-16 ENCOUNTER — Other Ambulatory Visit: Payer: Self-pay | Admitting: Dermatology

## 2021-01-16 ENCOUNTER — Other Ambulatory Visit: Payer: Self-pay | Admitting: Internal Medicine

## 2021-01-31 ENCOUNTER — Other Ambulatory Visit: Payer: Self-pay | Admitting: Cardiovascular Disease

## 2021-03-01 ENCOUNTER — Encounter: Payer: Self-pay | Admitting: Internal Medicine

## 2021-03-01 ENCOUNTER — Telehealth: Payer: Self-pay | Admitting: Internal Medicine

## 2021-03-01 ENCOUNTER — Ambulatory Visit (INDEPENDENT_AMBULATORY_CARE_PROVIDER_SITE_OTHER): Payer: BC Managed Care – PPO | Admitting: Internal Medicine

## 2021-03-01 ENCOUNTER — Other Ambulatory Visit: Payer: Self-pay

## 2021-03-01 VITALS — HR 93 | Temp 98.9°F

## 2021-03-01 DIAGNOSIS — J069 Acute upper respiratory infection, unspecified: Secondary | ICD-10-CM | POA: Diagnosis not present

## 2021-03-01 DIAGNOSIS — Z8709 Personal history of other diseases of the respiratory system: Secondary | ICD-10-CM | POA: Diagnosis not present

## 2021-03-01 DIAGNOSIS — I10 Essential (primary) hypertension: Secondary | ICD-10-CM

## 2021-03-01 DIAGNOSIS — E786 Lipoprotein deficiency: Secondary | ICD-10-CM

## 2021-03-01 DIAGNOSIS — R0981 Nasal congestion: Secondary | ICD-10-CM | POA: Diagnosis not present

## 2021-03-01 DIAGNOSIS — E039 Hypothyroidism, unspecified: Secondary | ICD-10-CM

## 2021-03-01 DIAGNOSIS — R059 Cough, unspecified: Secondary | ICD-10-CM | POA: Diagnosis not present

## 2021-03-01 DIAGNOSIS — E781 Pure hyperglyceridemia: Secondary | ICD-10-CM

## 2021-03-01 MED ORDER — LEVOFLOXACIN 500 MG PO TABS: 500.0000 mg | ORAL_TABLET | Freq: Every day | ORAL | 0 refills | Status: DC

## 2021-03-01 MED ORDER — HYDROCODONE-HOMATROPINE 5-1.5 MG/5ML PO SYRP
5.0000 mL | ORAL_SOLUTION | Freq: Three times a day (TID) | ORAL | 0 refills | Status: DC | PRN
Start: 2021-03-01 — End: 2021-03-18

## 2021-03-01 NOTE — Patient Instructions (Signed)
Quarantine at home until test results are back.  This will be probably 2 days.  Take Levaquin 500 mg daily for 10 days for upper respiratory/maxillary sinusitis symptoms.  May take Hycodan 1 teaspoon every 8 hours if needed for cough.  Rest and drink fluids.  Call if symptoms worsen.

## 2021-03-01 NOTE — Telephone Encounter (Signed)
Car visit

## 2021-03-01 NOTE — Progress Notes (Signed)
   Subjective:    Patient ID: Ricky Sharp, male    DOB: [birth year], 60 y.o.   MRN: [id number]  HPI  60 year old Male  seen for acute respiratory infection symptoms. Has nasal congestion,dry cough, and low grade fever up to 100 degrees.Symtoms started around March 18. He has had 3 Covid vaccines. Had flu vaccine in September.He took a home Covid test on Saturday March 19 and it was negative.  Being followed by Dr. Christella Noa for 3 mm rightward projecting aneurysm of cavernous segment of the right ICA. CT angio in August showed this to be unchanged.  Will be seeing Dr. Acie Fredrickson in April for Cardiology follow up. Hx of HTN.  Hx of hypothyroidism treated with thyroid replacement medication.   Hx hypertriglyceridemia  treated with low dose Crestor.   He  Is now living alone. Wife wanted separation. 2 daughters are here visiting. They are not ill. One is attending school in [place] and the other is living and working in [place], [place].  Review of Systems see above     Objective:   Physical Exam     T 98.9 degrees, pulse 93, pulse ox 97%     Sounds nasally congested when he speaks. Has malaise and fatigue. TMs clear. Chest is clear without wheezing or rales.       Assessment & Plan:  Acute upper respiratory infection-rule out COVID-19.  Nasal swab obtained for PCR COVID-19 test and also respiratory virus panel.  May have rhinovirus or allergic rhinitis.  Plan: He may be developing an acute maxillary sinusitis so I am placing him on Levaquin 500 mg daily for 10 days.  Have prescribed Hycodan 1 teaspoon every 8 hours as needed for cough.  He will quarantine at home and work from home for the next couple of days until these test results are back from the lab.  Rest and drink plenty of fluids.  Call if symptoms worsen.

## 2021-03-01 NOTE — Telephone Encounter (Signed)
Ricky Sharp [phone]  Berlie called to say he has been running fever 99.0 to 100.0, nasal congestion, breathing thru mouth, Dry cough, this started late Thursday or Friday morning. Done home COVID test on Saturday and it was negative. Has had all 3 COVID vaccines.

## 2021-03-02 LAB — SARS-COV-2 RNA,(COVID-19) QUALITATIVE NAAT: SARS CoV2 RNA: NOT DETECTED

## 2021-03-05 LAB — RESPIRATORY VIRUS PANEL

## 2021-03-20 ENCOUNTER — Encounter: Payer: Self-pay | Admitting: Cardiovascular Disease

## 2021-03-20 NOTE — Progress Notes (Signed)
Cardiology Office Note   Date:  03/22/2021   ID:  [name], DOB 06/18/1961, MRN [id number]  PCP:  Elby Showers, MD  Cardiologist:   Mertie Moores, MD  Chief Complaint  Patient presents with  . Hypertension      previoius notes.  Ricky Sharp is a 60 y.o. male who presents for HTN  1. Hypertension 2. Anxiety - stress related 3. GERD    Patient is a 60 year old gentleman with a history of hypertension. He's tried several medications in the past that have not completely controlled his blood pressure.  - He has tried Losartan but had some dizziness / dry mouth.  - tried bystolic 5 mg - felt poorly, persistant head ache, diarrhea, mild respiratory distress. Tried for a week  He has also tried some antianxiety meds but had various reactions.  He has multiple family and life stresses.   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 He is also having some family health issues that require him to spend lots of time with them.  He has felt well on his HCTZ and potassium since his last visit. He has not been exercising as much as he would like.  Oct. 1, 2014:  Ricky Sharp is a 60 yo with hx of HTN. He has not been exercising. He has gained 10 lbs. He has tried Losartan but it caused vertigo. He tried beta blockers but had severe fatigue and slow HR. He does not want to start any new meds. He knows that his weight is up and that is BP has been elevated.   March 04, 2014:  Ricky Sharp has been diagnosed with hypothyroidism since I last saw him. He is feeling somewhat better.  Diastolic BP has still been a bit high. Still eating some salt. BP at home is typically is the 80s - occasionally upper 80s.   Oct. 1, 2015:  Ricky Sharp is now off the Losartan - was having lots of fatigue and diarrhea. Symptoms are better. On HCTZ and blood pressures are well controlled. Rides  his bike on occasion. He also is taking Judo . Not Having any episodes of chest pain BP has been ok  Jan. 21, 2016:  Ricky Sharp has had HTN that has been difficult to manage.  He developed GI complaints with Losartan and with Lisinopril.  He had significant diarrhea on the Lisinopril - has resolved.   April 03, 2015:  Ricky Sharp has been seen in the hypertension clinic for the past several months. His blood pressure is now well controlled. No CP , no dyspnea. Still has anxiety.   April 04, 2016: Ricky Sharp is doing well  Weight is up a little  Mother died , lots of stress.  Amlodipine still causes diarrhea - ( like clockwork)   May 18, 2017  BP has been well controled at home.   Still eating a little more salt than he should. Not as active.   Weight is staying elevated.  Mother died 2 years ago and he had a struggle working through the estate.  No exercising   Jan. 8, 2018:  Ricky Sharp is seen today for follow up of his HTN. Exercising some.  Hoping to get out more . 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       December 20, 2017:  Ricky Sharp, seen today for further evaluation and management of his hypertension. No CP  or dyspnea . Eats some salt,  Not much  Does not exercise  - HDL is 28 Had a reaction to Losartan and Lisinopril   March 20, 2020 Ricky Sharp is see back for follow up of his HTN and HLD Has had both covid vaccines.  Has gained a little weight.   Moderately active.  Has lack of energy . Stressed about his fathers death .  Was found to have a cerebral aneurism. - incidentally noted on a head scan   March 22, 2021 Ricky Sharp is seen back for follow up of his HTN and HLD  Under lots of stress,  Is separated.  Under lots of stress  Not exercising,  Not eating right ,  No Cp , no dyspnea  BP is mildly elevated because of his inactivity and less than ideal diet   Wt Readings from Last 3 Encounters:  03/22/21 208 lb 3.2 oz (94.4 kg)  09/07/20 215 lb (97.5  kg)  03/20/20 215 lb 12 oz (97.9 kg)     Past Medical History:  Diagnosis Date  . Anxiety   . Eczema   . Hypertension   . Hypothyroidism   . Recurrent upper respiratory infection (URI)   . Sleep apnea    borderline, uses dental device    Past Surgical History:  Procedure Laterality Date  . CHOLECYSTECTOMY    . OSTEOCHONDROMA EXCISION    . TONSILLECTOMY    . TRIGGER FINGER RELEASE Right 11/26/2019   Procedure: RELEASE TRIGGER FINGER/A-1 PULLEY RIGHT MIDDLE FINGER;  Surgeon: Daryll Brod, MD;  Location: [facility];  Service: Orthopedics;  Laterality: Right;  IV REGIONAL FOREARM BLOCK  . WISDOM TOOTH EXTRACTION       Current Outpatient Medications  Medication Sig Dispense Refill  . ALPRAZolam (XANAX) 0.25 MG tablet TAKE 1 TABLET TWICE DAILY AS NEEDED FOR ANXIETY. 60 tablet 0  . diltiazem (DILACOR XR) 240 MG 24 hr capsule TAKE (1) CAPSULE DAILY. 30 capsule 11  . halobetasol (ULTRAVATE) 0.05 % cream Apply 1 application topically as needed.     Marland Kitchen levothyroxine (SYNTHROID) 112 MCG tablet TAKE 1 TABLET ONCE DAILY. 90 tablet 0  . potassium chloride (KLOR-CON) 10 MEQ tablet Take 2 tablets (20 mEq total) by mouth 2 (two) times daily. Pt must keep upcoming appt in April for further refills 360 tablet 0  . rosuvastatin (CRESTOR) 5 MG tablet TAKE 1 TABLET ONCE DAILY. 90 tablet 3  . XHANCE 93 MCG/ACT EXHU BLOW 2 DOSES IN EACH NOSTRIL TWICE DAILY 16 mL 0  . doxazosin (CARDURA) 2 MG tablet Take 1 tablet (2 mg total) by mouth at bedtime. 90 tablet 3  . hydrochlorothiazide (HYDRODIURIL) 25 MG tablet Take 1 tablet (25 mg total) by mouth daily. 90 tablet 3   No current facility-administered medications for this visit.    Allergies:   Lisinopril and Losartan    Social History:  The patient  reports that he has never smoked. He has never used smokeless tobacco. He reports current alcohol use of about 2.0 standard drinks of alcohol per week. He reports that he does not use drugs.    Family History:  The patient's family history includes Fibromyalgia in his mother; Lung disease in his mother; Syncope episode in his mother. He was adopted.   ROS:   Discussed in H&P.  Otherwise negative   Physical Exam: Blood pressure (!) 146/82, pulse 62, height 5\' 10"  (1.778 m), weight 208 lb 3.2 oz (94.4 kg), SpO2 98 %.  GEN:  Well nourished, well developed in no acute distress HEENT: Normal NECK: No JVD; No carotid bruits LYMPHATICS: No lymphadenopathy CARDIAC: RRR , no murmurs, rubs, gallops RESPIRATORY:  Clear to auscultation without rales, wheezing or rhonchi  ABDOMEN: Soft, non-tender, non-distended MUSCULOSKELETAL:  No edema; No deformity  SKIN: Warm and dry NEUROLOGIC:  Alert and oriented x 3   EKG:    March 22, 2021: Normal sinus rhythm at 62.  Nonspecific ST and T wave changes in V5 and V6.  No changes from previous EKG.  Recent Labs: 09/04/2020: ALT 41; BUN 17; Creat 1.11; Hemoglobin 17.3; Platelets 239; Potassium 3.9; Sodium 138; TSH 2.21    Lipid Panel    Component Value Date/Time   CHOL 127 09/04/2020 0924   TRIG 197 (H) 09/04/2020 0924   HDL 32 (L) 09/04/2020 0924   CHOLHDL 4.0 09/04/2020 0924   VLDL 61 (H) 12/13/2016 1135   LDLCALC 68 09/04/2020 0924      Wt Readings from Last 3 Encounters:  03/22/21 208 lb 3.2 oz (94.4 kg)  09/07/20 215 lb (97.5 kg)  03/20/20 215 lb 12 oz (97.9 kg)      Other studies Reviewed: Additional studies/ records that were reviewed today include: . Review of the above records demonstrates:    ASSESSMENT AND PLAN:  1.  Essential  Hypertension: Actually is tolerating his blood pressure medications.  His blood pressure is a little bit high today.  I suspect this is due to the recent stress in his life.  He separated from his wife and is not getting much exercise.  He also is not eating quite as good as he used to.  I encouraged him to work on getting more cardio exercise and to work on his diet.  He will continue to  take his blood pressure regularly.  If his blood pressure stays elevated we will increase the Cardura.  I will see him again in 1 year.   2.  Hyperlipidemia :    Lipids have been well controlled.  Continue rosuvastatin 5 mg a day.  We will check lipids, ALT, basic metabolic profile today.    3.  Cerebral aneurism:  Incidentally discovered.   Follow up with neurosurgery / neurology   Current medicines are reviewed at length with the patient today.  The patient does not have concerns regarding medicines.  The following changes have been made:      Labs/ tests ordered today include:   Orders Placed This Encounter  Procedures  . Basic metabolic panel  . ALT  . Lipid panel  . EKG 12-Lead    Disposition:       Signed, Mertie Moores, MD  03/22/2021 8:38 AM    [facility] [address], [place], [place]  [address] Phone: [phone]; Fax: [phone]

## 2021-03-22 ENCOUNTER — Encounter: Payer: Self-pay | Admitting: Cardiovascular Disease

## 2021-03-22 ENCOUNTER — Ambulatory Visit: Payer: BC Managed Care – PPO | Admitting: Cardiovascular Disease

## 2021-03-22 ENCOUNTER — Other Ambulatory Visit: Payer: Self-pay

## 2021-03-22 VITALS — BP 146/82 | HR 62 | Ht 70.0 in | Wt 208.2 lb

## 2021-03-22 DIAGNOSIS — I10 Essential (primary) hypertension: Secondary | ICD-10-CM

## 2021-03-22 LAB — BASIC METABOLIC PANEL
BUN/Creatinine Ratio: 16 (ref 10–24)
BUN: 20 mg/dL (ref 8–27)
CO2: 24 mmol/L (ref 20–29)
Calcium: 9.6 mg/dL (ref 8.6–10.2)
Chloride: 99 mmol/L (ref 96–106)
Creatinine, Ser: 1.24 mg/dL (ref 0.76–1.27)
Glucose: 137 mg/dL — ABNORMAL HIGH (ref 65–99)
Potassium: 3.6 mmol/L (ref 3.5–5.2)
Sodium: 140 mmol/L (ref 134–144)
eGFR: 66 mL/min/{1.73_m2} (ref >59)

## 2021-03-22 LAB — LIPID PANEL
Chol/HDL Ratio: 4.1 ratio (ref 0.0–5.0)
Cholesterol, Total: 124 mg/dL (ref 100–199)
HDL: 30 mg/dL — ABNORMAL LOW (ref >39)
LDL Chol Calc (NIH): 66 mg/dL (ref 0–99)
Triglycerides: 165 mg/dL — ABNORMAL HIGH (ref 0–149)
VLDL Cholesterol Cal: 28 mg/dL (ref 5–40)

## 2021-03-22 LAB — ALT: ALT: 32 IU/L (ref 0–44)

## 2021-03-22 MED ORDER — DOXAZOSIN MESYLATE 2 MG PO TABS: 2.0000 mg | ORAL_TABLET | Freq: Every day | ORAL | 3 refills | Status: DC

## 2021-03-22 MED ORDER — HYDROCHLOROTHIAZIDE 25 MG PO TABS: 1.0000 | ORAL_TABLET | Freq: Every day | ORAL | 3 refills | Status: DC

## 2021-03-22 NOTE — Patient Instructions (Signed)
Medication Instructions:  Your physician recommends that you continue on your current medications as directed. Please refer to the Current Medication list given to you today.  *If you need a refill on your cardiac medications before your next appointment, please call your pharmacy*   Lab Work: TODAY: Lipids, bmet, alt  If you have labs (blood work) drawn today and your tests are completely normal, you will receive your results only by: Marland Kitchen MyChart Message (if you have MyChart) OR . A paper copy in the mail If you have any lab test that is abnormal or we need to change your treatment, we will call you to review the results.   Testing/Procedures: none   Follow-Up: At [facility], you and your health needs are our priority.  As part of our continuing mission to provide you with exceptional heart care, we have created designated Provider Care Teams.  These Care Teams include your primary Cardiologist (physician) and Advanced Practice Providers (APPs -  Physician Assistants and Nurse Practitioners) who all work together to provide you with the care you need, when you need it.   Your next appointment:   1 year(s)  The format for your next appointment:   In Person  Provider:   You may see Mertie Moores, MD or one of the following Advanced Practice Providers on your designated Care Team:    Richardson Dopp, PA-C  [place], [place]

## 2021-03-29 ENCOUNTER — Other Ambulatory Visit: Payer: Self-pay | Admitting: Internal Medicine

## 2021-04-17 ENCOUNTER — Other Ambulatory Visit: Payer: Self-pay | Admitting: Internal Medicine

## 2021-05-17 ENCOUNTER — Other Ambulatory Visit: Payer: Self-pay | Admitting: Cardiovascular Disease

## 2021-07-07 ENCOUNTER — Other Ambulatory Visit: Payer: Self-pay

## 2021-07-07 ENCOUNTER — Encounter: Payer: Self-pay | Admitting: Dermatology

## 2021-07-07 ENCOUNTER — Ambulatory Visit: Payer: BC Managed Care – PPO | Admitting: Dermatology

## 2021-07-07 DIAGNOSIS — Z86018 Personal history of other benign neoplasm: Secondary | ICD-10-CM

## 2021-07-07 DIAGNOSIS — Z1283 Encounter for screening for malignant neoplasm of skin: Secondary | ICD-10-CM | POA: Diagnosis not present

## 2021-07-07 DIAGNOSIS — L719 Rosacea, unspecified: Secondary | ICD-10-CM

## 2021-07-07 DIAGNOSIS — Z85828 Personal history of other malignant neoplasm of skin: Secondary | ICD-10-CM | POA: Diagnosis not present

## 2021-07-07 DIAGNOSIS — D485 Neoplasm of uncertain behavior of skin: Secondary | ICD-10-CM

## 2021-07-07 DIAGNOSIS — D235 Other benign neoplasm of skin of trunk: Secondary | ICD-10-CM

## 2021-07-07 DIAGNOSIS — L821 Other seborrheic keratosis: Secondary | ICD-10-CM

## 2021-07-07 MED ORDER — IVERMECTIN 1 % EX CREA: 1.0000 "application " | TOPICAL_CREAM | Freq: Every evening | CUTANEOUS | 11 refills | Status: DC

## 2021-07-07 NOTE — Patient Instructions (Signed)
Contact [facility] if you don't hear from them by the end of the day today  [phone]    Biopsy, Surgery (Curettage) & Surgery (Excision) Aftercare Instructions  1. Okay to remove bandage in 24 hours  2. Wash area with soap and water  3. Apply Vaseline to area twice daily until healed (Not Neosporin)  4. Okay to cover with a Band-Aid to decrease the chance of infection or prevent irritation from clothing; also it's okay to uncover lesion at home.  5. Suture instructions: return to our office in 7-10 or 10-14 days for a nurse visit for suture removal. Variable healing with sutures, if pain or itching occurs call our office. It's okay to shower or bathe 24 hours after sutures are given.  6. The following risks may occur after a biopsy, curettage or excision: bleeding, scarring, discoloration, recurrence, infection (redness, yellow drainage, pain or swelling).  7. For questions, concerns and results call our office at [place] before 4pm & Friday before 3pm. Biopsy results will be available in 1 week.

## 2021-07-14 ENCOUNTER — Other Ambulatory Visit: Payer: Self-pay | Admitting: Internal Medicine

## 2021-07-22 ENCOUNTER — Telehealth: Payer: Self-pay | Admitting: *Deleted

## 2021-07-22 DIAGNOSIS — L719 Rosacea, unspecified: Secondary | ICD-10-CM

## 2021-07-22 MED ORDER — IVERMECTIN 1 % EX CREA
TOPICAL_CREAM | CUTANEOUS | 3 refills | Status: AC
Start: 2021-07-22 — End: ?

## 2021-07-22 NOTE — Telephone Encounter (Signed)
[place] Puller (Key: [id number])  This request has received an approval. View the bottom of the request for an electronic copy of the approval letter.

## 2021-07-22 NOTE — Telephone Encounter (Signed)
Roberts for Robb Matar has expired. So per patient he wanted prescription sent to [facility]. Did Prior Authorization via cover my meds.   [place] Puller (Key: [id number])  This request has received a N/A outcome.  Please note any additional information provided by [employer] at the bottom of this request.

## 2021-07-23 ENCOUNTER — Encounter: Payer: Self-pay | Admitting: Dermatology

## 2021-07-23 NOTE — Progress Notes (Signed)
   Follow-Up Visit   Subjective  Ricky Sharp is a 60 y.o. male who presents for the following: Annual Exam (Rosacea flare on face currently, right forearm tan lesion x years . History of bcc scc and atypia ).  Annual skin examination, check spot on right arm and rosacea. Location:  Duration:  Quality:  Associated Signs/Symptoms: Modifying Factors:  Severity:  Timing: Context:   Objective  Well appearing patient in no apparent distress; mood and affect are within normal limits. Left Abdomen (side) - Upper [place] 5 mm papule, dermoscopy amorphous.  Rule out early [facility]       Dorsum of Nose Moderate central facial edematous erythema.  Likely exacerbated by the COVID mask.    All skin waist up examined.   Assessment & Plan    Neoplasm of uncertain behavior of skin Left Abdomen (side) - Upper  Skin / nail biopsy Type of biopsy: tangential   Informed consent: discussed and consent obtained   Timeout: patient name, date of birth, surgical site, and procedure verified   Procedure prep:  Patient was prepped and draped in usual sterile fashion (Non sterile) Prep type:  Chlorhexidine Anesthesia: the lesion was anesthetized in a standard fashion   Anesthetic:  1% lidocaine w/ epinephrine 1-100,000 local infiltration Instrument used: flexible razor blade   Hemostasis achieved with: ferric subsulfate   Outcome: patient tolerated procedure well   Post-procedure details: sterile dressing applied and wound care instructions given   Dressing type: bandage and petrolatum    Specimen 1 - Surgical pathology Differential Diagnosis: R/O BCC vs SCC  Check Margins: Yes  Rosacea Dorsum of Nose  We will okay refills on topical ivermectin which has been historically helpful.  Related Medications Ivermectin (SOOLANTRA) 1 % CREA Apply 1 application topically at bedtime.      I, Lavonna Monarch, MD, have reviewed all documentation for this visit.  The documentation on  07/23/21 for the exam, diagnosis, procedures, and orders are all accurate and complete.

## 2021-10-07 ENCOUNTER — Telehealth (INDEPENDENT_AMBULATORY_CARE_PROVIDER_SITE_OTHER): Payer: BC Managed Care – PPO | Admitting: Internal Medicine

## 2021-10-07 ENCOUNTER — Encounter: Payer: Self-pay | Admitting: Internal Medicine

## 2021-10-07 ENCOUNTER — Telehealth: Payer: Self-pay | Admitting: Internal Medicine

## 2021-10-07 VITALS — BP 131/76 | HR 65 | Temp 99.6°F

## 2021-10-07 DIAGNOSIS — I10 Essential (primary) hypertension: Secondary | ICD-10-CM | POA: Diagnosis not present

## 2021-10-07 DIAGNOSIS — Z8709 Personal history of other diseases of the respiratory system: Secondary | ICD-10-CM

## 2021-10-07 DIAGNOSIS — J22 Unspecified acute lower respiratory infection: Secondary | ICD-10-CM

## 2021-10-07 MED ORDER — HYDROCODONE BIT-HOMATROP MBR 5-1.5 MG/5ML PO SOLN: 5.0000 mL | Freq: Four times a day (QID) | ORAL | 0 refills | Status: DC | PRN

## 2021-10-07 MED ORDER — PREDNISONE 10 MG PO TABS: ORAL_TABLET | ORAL | 0 refills | Status: DC

## 2021-10-07 MED ORDER — LEVOFLOXACIN 500 MG PO TABS: 500.0000 mg | ORAL_TABLET | Freq: Every day | ORAL | 1 refills | Status: AC

## 2021-10-07 NOTE — Telephone Encounter (Signed)
Ricky Sharp [phone]  [name] called to say on Sunday he started with sore throat and drainage, now he also has heavy chest congestion, stuffy nose and a cough also, he took a home COVID test on Monday and this morning and they both were negative, he has had 4 COVID vaccines, he is due for a flu vaccine.

## 2021-10-07 NOTE — Progress Notes (Signed)
   Subjective:    Patient ID: Ricky Sharp, male    DOB: October 19, 1961, 60 y.o.   MRN: [id number]  HPI 60 year old Male with history of asthma traveled to the [place] area for family wedding.  Subsequently came down with an acute respiratory infection.    He is seen today via interactive audio and video telecommunications due to the Coronavirus pandemic.    He is agreeable to visit in this format today.  He is at his home and I am at my office.  He is identified using 2 identifiers as Ricky Sharp, a patient in this practice.  He reports sore throat, postnasal drip, chest congestion, stuffy nose and cough.  He has taken 2 COVID-19 test 1 on Monday and another 1 this morning and both of these were negative.  He has had 4 COVID vaccines total.  He has malaise and fatigue    Review of Systems see above-no fever or shaking chills     Objective:   Physical Exam He is seen virtually and looks a bit pale but he is not in any acute respiratory distress and has no audible wheezing  Reports temperature to be 99.6, pulse 65 and blood pressure 131/76     Assessment & Plan:  Acute lower respiratory infection-doubt COVID-19 with 2 negative COVID test done at home  History of asthma treated with Xhance inhaler  Essential hypertension treated with HCTZ and Cardura  Hypothyroidism treated with thyroid replacement medication consisting of levothyroxine 0.112 mg daily  History of hyperlipidemia treated with Zocor 5 mg daily  Plan: He will take Levaquin 500 mg daily for 7 days.  He may take Hycodan 1 teaspoon every 6 hours as needed for cough.  He is to stay out of work for couple of days minimum.  He will take prednisone in tapering course starting with 6 x 10 mg tablets on day 1 and decreasing by 10 mg daily i.e. 6-5-4-3-2-1 taper.  Advised to rest and drink plenty of fluids.  Monitor pulse oximetry and call if condition worsens.  Time spent reviewing chart, interviewing patient  ,and e prescribing medication is 20 minutes

## 2021-10-07 NOTE — Telephone Encounter (Signed)
scheduled

## 2021-10-11 ENCOUNTER — Encounter: Payer: Self-pay | Admitting: Internal Medicine

## 2021-10-11 NOTE — Patient Instructions (Signed)
Sorry to hear that you are ill.  Take Levaquin 500 mg daily for 7 days.  May take Hycodan 1 teaspoon every 6 hours as needed for cough.  Stay out of work for couple of days minimum.  Take prednisone in tapering course as directed.  Rest and drink fluids.  Monitor pulse oximetry.  Call if symptoms worsen.

## 2021-10-12 ENCOUNTER — Other Ambulatory Visit: Payer: Self-pay | Admitting: Internal Medicine

## 2021-10-19 NOTE — Telephone Encounter (Signed)
Ricky Sharp called back to say that he has a loose cough that has come back over the last 24 hours, no fever or other symptoms yet. He thought he was over this. He is leaving town on Thursday.

## 2021-10-19 NOTE — Telephone Encounter (Signed)
Ricky Sharp called back to say COVID test is negative, however he has now started having a fever of 100 and some sinus congestion. I ask him to also do a COVID test in the morning just incase. He is going to do that.

## 2021-10-20 ENCOUNTER — Ambulatory Visit: Payer: BC Managed Care – PPO | Admitting: Internal Medicine

## 2021-10-20 ENCOUNTER — Other Ambulatory Visit: Payer: Self-pay

## 2021-10-20 ENCOUNTER — Encounter: Payer: Self-pay | Admitting: Internal Medicine

## 2021-10-20 VITALS — BP 150/88 | HR 63 | Temp 97.4°F

## 2021-10-20 DIAGNOSIS — J22 Unspecified acute lower respiratory infection: Secondary | ICD-10-CM | POA: Diagnosis not present

## 2021-10-20 MED ORDER — LEVOFLOXACIN 500 MG PO TABS: 500.0000 mg | ORAL_TABLET | Freq: Every day | ORAL | 0 refills | Status: DC

## 2021-10-20 NOTE — Progress Notes (Signed)
   Subjective:    Patient ID: Ricky Sharp, male    DOB: 02/09/1961, 60 y.o.   MRN: [id number]  HPI Patient had lower respiratory infection after attending a wedding around October 27.  He tested negative for COVID at home at that time.  He was seen and treated for an acute lower respiratory infection with Levaquin 500 mg daily for 7 days, Hycodan cough syrup as needed and a tapering course of prednisone.  He improved but never completely got rid of the cough.  Recently ,the cough seems to have gotten worse and he is planning to travel to [place] via airplane and is concerned.  He has tested several times for COVID and has been negative.  Cough is productive.  He has no fever or shaking chills and no respiratory distress.  He has a history of asthma.  Still had phelgmy cough  Review of Systems will be getting Covid booster  Nov 22nd     Objective:   Physical Exam   BP 150/88 pulse oximetry 98%.  Temperature 97.4 degrees  BP had been 124/78 this morning at home. TMs are clear.  Pharynx is clear.  Neck is supple.  Chest is clear to auscultation without rales or wheezing.     Assessment & Plan:  Protracted lower respiratory infection  History of asthma  Plan: He does not feel like he needs prednisone at this point in time.  I have prescribed Levaquin 500 mg daily for 10 days as he seems to continue to be symptomatic with an acute lower respiratory infection that is likely bacterial.  He has no acute respiratory distress and his pulse oximetry is 98%.  He has Hycodan from previous visit that he can take sparingly for cough.

## 2021-10-20 NOTE — Patient Instructions (Addendum)
Take Hycodan sparingly for cough.  Take Levaquin 500 mg daily for 10 days.  It was a pleasure to see you today.  Please call if symptoms persist or do not improve.

## 2021-11-02 ENCOUNTER — Encounter: Payer: Self-pay | Admitting: Internal Medicine

## 2021-11-02 NOTE — Telephone Encounter (Signed)
Documented in chart.

## 2021-11-22 ENCOUNTER — Other Ambulatory Visit: Payer: Self-pay

## 2021-11-22 ENCOUNTER — Other Ambulatory Visit: Payer: BC Managed Care – PPO | Admitting: Internal Medicine

## 2021-11-22 DIAGNOSIS — Z125 Encounter for screening for malignant neoplasm of prostate: Secondary | ICD-10-CM

## 2021-11-22 DIAGNOSIS — E039 Hypothyroidism, unspecified: Secondary | ICD-10-CM

## 2021-11-22 DIAGNOSIS — E781 Pure hyperglyceridemia: Secondary | ICD-10-CM

## 2021-11-22 DIAGNOSIS — I10 Essential (primary) hypertension: Secondary | ICD-10-CM

## 2021-11-22 LAB — LIPID PANEL
Cholesterol: 133 mg/dL (ref <200)
HDL: 35 mg/dL — ABNORMAL LOW (ref >=40)
LDL Cholesterol (Calc): 74 mg/dL (calc)
Non-HDL Cholesterol (Calc): 98 mg/dL (calc) (ref <130)
Total CHOL/HDL Ratio: 3.8 (calc) (ref <5.0)
Triglycerides: 159 mg/dL — ABNORMAL HIGH (ref <150)

## 2021-11-22 LAB — CBC WITH DIFFERENTIAL/PLATELET
Absolute Monocytes: 572 cells/uL (ref 200–950)
Basophils Absolute: 59 cells/uL (ref 0–200)
Basophils Relative: 1.1 %
Eosinophils Absolute: 103 cells/uL (ref 15–500)
Eosinophils Relative: 1.9 %
HCT: 49.3 % (ref 38.5–50.0)
Hemoglobin: 16.5 g/dL (ref 13.2–17.1)
Lymphs Abs: 1442 cells/uL (ref 850–3900)
MCH: 30.1 pg (ref 27.0–33.0)
MCHC: 33.5 g/dL (ref 32.0–36.0)
MCV: 89.8 fL (ref 80.0–100.0)
MPV: 10.5 fL (ref 7.5–12.5)
Monocytes Relative: 10.6 %
Neutro Abs: 3224 cells/uL (ref 1500–7800)
Neutrophils Relative %: 59.7 %
Platelets: 259 10*3/uL (ref 140–400)
RBC: 5.49 10*6/uL (ref 4.20–5.80)
RDW: 13.5 % (ref 11.0–15.0)
Total Lymphocyte: 26.7 %
WBC: 5.4 10*3/uL (ref 3.8–10.8)

## 2021-11-22 LAB — COMPLETE METABOLIC PANEL WITH GFR
AG Ratio: 1.8 (calc) (ref 1.0–2.5)
ALT: 28 U/L (ref 9–46)
AST: 18 U/L (ref 10–35)
Albumin: 4.4 g/dL (ref 3.6–5.1)
Alkaline phosphatase (APISO): 61 U/L (ref 35–144)
BUN: 16 mg/dL (ref 7–25)
CO2: 30 mmol/L (ref 20–32)
Calcium: 9.3 mg/dL (ref 8.6–10.3)
Chloride: 102 mmol/L (ref 98–110)
Creat: 0.98 mg/dL (ref 0.70–1.35)
Globulin: 2.5 g/dL (calc) (ref 1.9–3.7)
Glucose, Bld: 90 mg/dL (ref 65–99)
Potassium: 3.8 mmol/L (ref 3.5–5.3)
Sodium: 140 mmol/L (ref 135–146)
Total Bilirubin: 0.7 mg/dL (ref 0.2–1.2)
Total Protein: 6.9 g/dL (ref 6.1–8.1)
eGFR: 88 mL/min/{1.73_m2} (ref >=60)

## 2021-11-22 LAB — TSH: TSH: 2.4 mIU/L (ref 0.40–4.50)

## 2021-11-22 LAB — PSA: PSA: 0.37 ng/mL (ref <=4.00)

## 2021-11-26 ENCOUNTER — Ambulatory Visit (INDEPENDENT_AMBULATORY_CARE_PROVIDER_SITE_OTHER): Payer: BC Managed Care – PPO | Admitting: Internal Medicine

## 2021-11-26 ENCOUNTER — Other Ambulatory Visit: Payer: Self-pay

## 2021-11-26 ENCOUNTER — Encounter: Payer: Self-pay | Admitting: Internal Medicine

## 2021-11-26 VITALS — BP 142/70 | HR 83 | Temp 97.6°F | Ht 71.0 in | Wt 208.0 lb

## 2021-11-26 DIAGNOSIS — E039 Hypothyroidism, unspecified: Secondary | ICD-10-CM

## 2021-11-26 DIAGNOSIS — Z23 Encounter for immunization: Secondary | ICD-10-CM

## 2021-11-26 DIAGNOSIS — Z8709 Personal history of other diseases of the respiratory system: Secondary | ICD-10-CM

## 2021-11-26 DIAGNOSIS — F419 Anxiety disorder, unspecified: Secondary | ICD-10-CM

## 2021-11-26 DIAGNOSIS — E781 Pure hyperglyceridemia: Secondary | ICD-10-CM

## 2021-11-26 DIAGNOSIS — I10 Essential (primary) hypertension: Secondary | ICD-10-CM

## 2021-11-26 DIAGNOSIS — Z0000 Encounter for general adult medical examination without abnormal findings: Secondary | ICD-10-CM | POA: Diagnosis not present

## 2021-11-26 DIAGNOSIS — R319 Hematuria, unspecified: Secondary | ICD-10-CM

## 2021-11-26 DIAGNOSIS — Z1211 Encounter for screening for malignant neoplasm of colon: Secondary | ICD-10-CM | POA: Diagnosis not present

## 2021-11-26 DIAGNOSIS — R82998 Other abnormal findings in urine: Secondary | ICD-10-CM

## 2021-11-26 DIAGNOSIS — E786 Lipoprotein deficiency: Secondary | ICD-10-CM | POA: Diagnosis not present

## 2021-11-26 DIAGNOSIS — G4733 Obstructive sleep apnea (adult) (pediatric): Secondary | ICD-10-CM

## 2021-11-26 DIAGNOSIS — J301 Allergic rhinitis due to pollen: Secondary | ICD-10-CM

## 2021-11-26 LAB — POCT URINALYSIS DIPSTICK
Bilirubin, UA: NEGATIVE
Glucose, UA: NEGATIVE
Ketones, UA: NEGATIVE
Nitrite, UA: NEGATIVE
Protein, UA: NEGATIVE
Spec Grav, UA: 1.025 (ref 1.010–1.025)
Urobilinogen, UA: 0.2 E.U./dL
pH, UA: 5 (ref 5.0–8.0)

## 2021-11-26 LAB — HEMOCCULT GUIAC POC 1CARD (OFFICE): Fecal Occult Blood, POC: NEGATIVE

## 2021-11-26 NOTE — Progress Notes (Signed)
° °  Subjective:    Patient ID: Ricky Sharp, male    DOB: 06/23/1961, 60 y.o.   MRN: [id number]  HPI 60 year old Male for health maintenance exam and evaluation of medical issues.  History of obstructive sleep apnea and has CPAP machine.  Followed by Dr. Acie Fredrickson for hypertension.  In July 2017 he had acute diverticulitis of the sigmoid colon treated with antibiotics.  Last colonoscopy was in 2020 by Dr. Collene Mares.  Repeat study due in 2030.  In April 2020, he had an E. coli urinary tract infection treated with Cipro.  Cholecystectomy 2004.  Negative myocardial perfusion study in 2003.  He was admitted to the hospital with chest and epigastric pain.  Upper endoscopy was performed and he was diagnosed with peptic ulcer disease secondary to H. pylori infection.  MI was ruled out at the time.  Has perennial and seasonal rhinitis.  Tends to wheeze with respiratory infections.  Has seen allergist.  Stopped allergy injections in 2018.  Has seen Dr. [name] for hypersomnia.  Additional past medical history: Fractured left clavicle 1981, wisdom teeth extracted 1992, osteochondroma removed from right tibia 1993.  Had kidney stone in 2003.  Laceration of right hand August 2017 requiring sutures.  History of positive Hepatitis B antibody with history of Hepatitis in 1966.  Was never immunized for Hepatitis B.  Social history: Resides alone.  He has 2 adult daughters.   Works in Engineer, technical sales at [employer].  Non-smoker.  Occasionally consumes alcohol socially.  Family history: Mother with history of asthma died with heart failure.  No siblings.  Does not know biological father's family history.  History of 3 mm aneurysm projecting laterally from the horizontal portion of the right cavernous ICA.  Has seen Dr. Christella Noa.  He has trace occult blood and trace LE on urine dipstick.  This was sent for culture and result was no growth.  Review of Systems no new complaints     Objective:    Physical Exam Vital signs reviewed.  Blood pressure elevated at 142/70 but he may be anxious.  BMI 29.01 weight 208 pounds.  Skin: Warm and dry.  No cervical adenopathy.  No thyromegaly.  No carotid bruits.  Chest is clear to auscultation.  Cardiac exam: Regular rate and rhythm without ectopy.  Abdomen is soft nondistended without hepatosplenomegaly masses or tenderness.  Prostate is not enlarged and without nodules.  No lower extremity edema.  Neuro is intact without gross focal deficits.  Affect thought and judgment are normal. Stool guaic negative.       Assessment & Plan:   Essential hypertension-she is also seen by Dr. Acie Fredrickson, cardiologist.  He has labile hypertension generally related to anxiety.  He will continue to monitor his blood pressure at home and let me know if persistently elevated.  Currently on Cardura, [place] and HCTZ.  Low HDL.  Recent reading was 35.  Mild elevation of triglycerides at 159.  He is on rosuvastatin 5 mg daily.  LDL is normal at 74  Hypothyroidism treated with levothyroxine 0.112 mg daily  Situational stress-has Xanax to take twice daily if needed for anxiety.  Plan: Return in 1 year or as needed.

## 2021-11-27 LAB — URINE CULTURE
MICRO NUMBER:: 12767747
Result:: NO GROWTH
SPECIMEN QUALITY:: ADEQUATE

## 2021-12-24 ENCOUNTER — Encounter (HOSPITAL_COMMUNITY): Payer: Self-pay | Admitting: *Deleted

## 2021-12-24 ENCOUNTER — Other Ambulatory Visit: Payer: Self-pay

## 2021-12-24 ENCOUNTER — Emergency Department (HOSPITAL_COMMUNITY)
Admission: EM | Admit: 2021-12-24 | Discharge: 2021-12-24 | Disposition: A | Discharge status: Home / Self Care | Payer: BC Managed Care – PPO | Attending: Student | Admitting: Student

## 2021-12-24 ENCOUNTER — Ambulatory Visit (INDEPENDENT_AMBULATORY_CARE_PROVIDER_SITE_OTHER): Payer: BC Managed Care – PPO | Admitting: Internal Medicine

## 2021-12-24 ENCOUNTER — Emergency Department (HOSPITAL_COMMUNITY): Payer: BC Managed Care – PPO

## 2021-12-24 DIAGNOSIS — R1030 Lower abdominal pain, unspecified: Secondary | ICD-10-CM

## 2021-12-24 DIAGNOSIS — R194 Change in bowel habit: Secondary | ICD-10-CM | POA: Diagnosis not present

## 2021-12-24 DIAGNOSIS — Z5321 Procedure and treatment not carried out due to patient leaving prior to being seen by health care provider: Secondary | ICD-10-CM | POA: Diagnosis not present

## 2021-12-24 DIAGNOSIS — R110 Nausea: Secondary | ICD-10-CM | POA: Insufficient documentation

## 2021-12-24 DIAGNOSIS — R1031 Right lower quadrant pain: Secondary | ICD-10-CM | POA: Insufficient documentation

## 2021-12-24 LAB — CBC WITH DIFFERENTIAL/PLATELET
Abs Immature Granulocytes: 0.03 10*3/uL (ref 0.00–0.07)
Basophils Absolute: 0.1 10*3/uL (ref 0.0–0.1)
Basophils Relative: 1 %
Eosinophils Absolute: 0.1 10*3/uL (ref 0.0–0.5)
Eosinophils Relative: 2 %
HCT: 46.1 % (ref 39.0–52.0)
Hemoglobin: 16.4 g/dL (ref 13.0–17.0)
Immature Granulocytes: 1 %
Lymphocytes Relative: 29 %
Lymphs Abs: 1.8 10*3/uL (ref 0.7–4.0)
MCH: 30.4 pg (ref 26.0–34.0)
MCHC: 35.6 g/dL (ref 30.0–36.0)
MCV: 85.5 fL (ref 80.0–100.0)
Monocytes Absolute: 0.6 10*3/uL (ref 0.1–1.0)
Monocytes Relative: 9 %
Neutro Abs: 3.8 10*3/uL (ref 1.7–7.7)
Neutrophils Relative %: 58 %
Platelets: 246 10*3/uL (ref 150–400)
RBC: 5.39 MIL/uL (ref 4.22–5.81)
RDW: 12.8 % (ref 11.5–15.5)
WBC: 6.4 10*3/uL (ref 4.0–10.5)
nRBC: 0 % (ref 0.0–0.2)

## 2021-12-24 LAB — URINALYSIS, ROUTINE W REFLEX MICROSCOPIC
Bilirubin Urine: NEGATIVE
Glucose, UA: NEGATIVE mg/dL
Ketones, ur: NEGATIVE mg/dL
Leukocytes,Ua: NEGATIVE
Nitrite: NEGATIVE
Protein, ur: NEGATIVE mg/dL
RBC / HPF: >50 RBC/hpf — ABNORMAL HIGH (ref 0–5)
Specific Gravity, Urine: 1.026 (ref 1.005–1.030)
pH: 5 (ref 5.0–8.0)

## 2021-12-24 LAB — COMPREHENSIVE METABOLIC PANEL
ALT: 33 U/L (ref 0–44)
AST: 23 U/L (ref 15–41)
Albumin: 4.2 g/dL (ref 3.5–5.0)
Alkaline Phosphatase: 59 U/L (ref 38–126)
Anion gap: 6 (ref 5–15)
BUN: 17 mg/dL (ref 8–23)
CO2: 25 mmol/L (ref 22–32)
Calcium: 8.6 mg/dL — ABNORMAL LOW (ref 8.9–10.3)
Chloride: 106 mmol/L (ref 98–111)
Creatinine, Ser: 1.25 mg/dL — ABNORMAL HIGH (ref 0.61–1.24)
GFR, Estimated: >60 mL/min (ref >60)
Glucose, Bld: 177 mg/dL — ABNORMAL HIGH (ref 70–99)
Potassium: 3.3 mmol/L — ABNORMAL LOW (ref 3.5–5.1)
Sodium: 137 mmol/L (ref 135–145)
Total Bilirubin: 0.8 mg/dL (ref 0.3–1.2)
Total Protein: 7 g/dL (ref 6.5–8.1)

## 2021-12-24 LAB — LIPASE, BLOOD: Lipase: 27 U/L (ref 11–51)

## 2021-12-24 MED ORDER — IOHEXOL 300 MG/ML  SOLN
100.0000 mL | Freq: Once | INTRAMUSCULAR | Status: AC | PRN
  Administered 2021-12-24: 100 mL via INTRAVENOUS

## 2021-12-24 MED ORDER — ONDANSETRON 4 MG PO TBDP
4.0000 mg | ORAL_TABLET | Freq: Once | ORAL | Status: AC
  Administered 2021-12-24: 4 mg via ORAL
  Filled 2021-12-24: qty 1

## 2021-12-24 NOTE — ED Notes (Signed)
Pt came up to me asking about wait time. Pt stated that he is going to leave and call his PCP.

## 2021-12-24 NOTE — ED Provider Triage Note (Signed)
Emergency Medicine Provider Triage Evaluation Note  Ricky Sharp , a 61 y.o. male  was evaluated in triage.  Pt complains of sudden onset of RLQ pain that started 20 minutes prior to arrival. History of kidney stones and diverticulosis. Previous cholecystectomy. Abdominal pain associated with nausea. No vomiting. He had a BM earlier today. No urinary symptoms.   Review of Systems  Positive: Abdominal pain, nausea Negative: dysuria  Physical Exam  BP (!) 170/88 (BP Location: Right Arm)    Pulse 60    Temp (!) 97.4 F (36.3 C) (Oral)    Resp 20    SpO2 98%  Gen:   Awake, no distress   Resp:  Normal effort  MSK:   Moves extremities without difficulty  Other:    Medical Decision Making  Medically screening exam initiated at 7:52 AM.  Appropriate orders placed.  Ricky Sharp was informed that the remainder of the evaluation will be completed by another provider, this initial triage assessment does not replace that evaluation, and the importance of remaining in the ED until their evaluation is complete.  Abdominal labs CT abdomen Zofran given in triage   Suzy Bouchard, PA-C 12/24/21 7829

## 2021-12-24 NOTE — ED Triage Notes (Signed)
Reports sudden onset 20 mins ago of severe RLQ pain with nausea. Last bm was this morning and normal, denies urinary symptoms.

## 2021-12-25 ENCOUNTER — Encounter: Payer: Self-pay | Admitting: Internal Medicine

## 2021-12-25 NOTE — Progress Notes (Signed)
Phone call to patient after office hours.  I spoke personally with the patient and identified him as Ricky Sharp, a patient in this practice using 2 identifiers.  He is at his home and I am outside a [employer] here in town.  He is agreeable to speaking with me.  Office staff told me around 5pm  that had had gone to the ED earlier today but was wanting to leave.  He had called the office with this information.   I  have reviewed the Emergency Dept record, Labs, and CT. His creatinine is elevated at 1.25, lipase is normal, urine specimen abnormal with SG 1.026, cloudy appearance, large blood on dipstick, greater than 50 RBCs per HPF and rare bacteria. Urine was amber colored. I spoke with patient and he is at home and feeling better. He was driving to [facility] to pick up a friend at the airport and had sudden onset of lower quadrant pain that was scary and excruciating. He drove himself to ED. There was a very long wait. He had the Ct and lab tests done, began to feel better and left. He now feels better, he has appt Monday here for flu vaccine. We can follow up then.CT did not show kidney stone. He could have passed it.Some mild thickening of colonic wall possibly mild colitis. Small umbilical hernia.No appendicitis.No acute diverticultitis. He will watch diet and stay well hydrated over the weekend and call if symptoms return or worsen.  Time spent with telephone call and reviewing records from ED is 15 minutes.

## 2021-12-25 NOTE — Patient Instructions (Addendum)
It was a pleasure to see you today.  Continue current medications and follow-up in 1 year or as needed.  Addendum: Urine culture was negative  Coronary calcium score ordered.

## 2021-12-27 ENCOUNTER — Telehealth: Payer: Self-pay | Admitting: Internal Medicine

## 2021-12-27 ENCOUNTER — Encounter: Payer: Self-pay | Admitting: Internal Medicine

## 2021-12-27 ENCOUNTER — Ambulatory Visit: Payer: BC Managed Care – PPO | Admitting: Internal Medicine

## 2021-12-27 ENCOUNTER — Other Ambulatory Visit: Payer: Self-pay

## 2021-12-27 VITALS — BP 144/88 | HR 72 | Temp 97.4°F | Ht 71.0 in | Wt 213.0 lb

## 2021-12-27 DIAGNOSIS — I10 Essential (primary) hypertension: Secondary | ICD-10-CM

## 2021-12-27 DIAGNOSIS — R1031 Right lower quadrant pain: Secondary | ICD-10-CM | POA: Diagnosis not present

## 2021-12-27 DIAGNOSIS — Z23 Encounter for immunization: Secondary | ICD-10-CM | POA: Diagnosis not present

## 2021-12-27 DIAGNOSIS — R319 Hematuria, unspecified: Secondary | ICD-10-CM | POA: Diagnosis not present

## 2021-12-27 LAB — POCT URINALYSIS DIPSTICK
Bilirubin, UA: NEGATIVE
Blood, UA: NEGATIVE
Glucose, UA: NEGATIVE
Ketones, UA: NEGATIVE
Leukocytes, UA: NEGATIVE
Nitrite, UA: NEGATIVE
Protein, UA: NEGATIVE
Spec Grav, UA: 1.015 (ref 1.010–1.025)
Urobilinogen, UA: 0.2 E.U./dL
pH, UA: 6.5 (ref 5.0–8.0)

## 2021-12-27 NOTE — Progress Notes (Signed)
° °  Subjective:    Patient ID: Ricky Sharp, male    DOB: Apr 29, 1961, 61 y.o.   MRN: [id number]  HPI 61 year old Male was driving to airport in [place] was still in [place] area when he had sudden onset right lower abdominal pain with nausea. He drove himself to ED. There was a very long wait. He had other obligations so after having CT and labs drawn as well as urine specimen being obtained he left without being seen. Denies urinary symptoms.  His symptoms improved and he is feeling fine today.  He had abnormal urine specimen in the emergency department with large occult blood and urine being amber and cloudy with specific gravity 1.026.  Urine showed greater than 50 red blood cells per high-powered field and rare bacteria.  CT of abdomen and pelvis with contrast showed normal appendix, hepatic steatosis and colonic diverticulosis without acute diverticulitis.  Small amount of formed stool in the colon.  No dilatation of small or large bowel.  There was some apparent mild wall thickening of the colon extending from splenic flexure through the descending colon reflecting perhaps underdistention or mild colitis per radiology report.  Diverticulosis was present without acute diverticulitis.  He was just seen here in December for health maintenance exam.  History of cholecystectomy 2004.  Had diverticulitis of the sigmoid colon in July 2017.  Last colonoscopy by Dr. Collene Mares was 2020.  He has anxiety and essential hypertension.  He sees Dr. Acie Fredrickson for hypertension.  History of obstructive sleep apnea and uses CPAP.    Review of Systems no nausea, vomiting, abdominal pain or urinary symptoms at all today.  No fever or shaking chills.     Objective:   Physical Exam Abdomen is soft nondistended without hepatosplenomegaly masses or tenderness.  Urine dipstick is completely normal.  His blood pressure is elevated at 144/88.  He is afebrile.  He needs to monitor his blood pressure at home and let me  know if persistently elevated.       Assessment & Plan:  I think it is possible he passed a kidney stone.  His urine specimen is normal today and was highly abnormal with hematuria when he presented to the emergency department on Friday, January 13.  He has no nausea vomiting or abdominal pain at this point in time.  If he had diverticulitis it is unlikely the symptoms would have improved without treatment.  He says he may be due for colonoscopy and he will check with Dr. Collene Mares regarding this.  His blood pressure is elevated today at 144/88.  This could be due to office hypertension.  He should continue to monitor this.  Pulse oximetry 97% weight 213 pounds pulse 72 regular  Also he needs tetanus immunization update today which was given.  Time spent reviewing records from emergency department including labs and CT scan, medical decision making and seeing patient today in office is 40 minutes

## 2021-12-27 NOTE — Telephone Encounter (Signed)
Faxed office notes, CT results, labs about abdominal pain and hospital visit to Dr Juanita Craver [phone], phone [phone]

## 2021-12-29 ENCOUNTER — Other Ambulatory Visit: Payer: Self-pay

## 2021-12-29 ENCOUNTER — Ambulatory Visit (HOSPITAL_COMMUNITY)
Admission: RE | Admit: 2021-12-29 | Discharge: 2021-12-29 | Disposition: A | Discharge status: Home / Self Care | Payer: BC Managed Care – PPO | Source: Ambulatory Visit | Attending: Internal Medicine | Admitting: Internal Medicine

## 2021-12-29 DIAGNOSIS — E7800 Pure hypercholesterolemia, unspecified: Secondary | ICD-10-CM | POA: Insufficient documentation

## 2022-01-06 ENCOUNTER — Other Ambulatory Visit: Payer: Self-pay

## 2022-01-06 DIAGNOSIS — I2510 Atherosclerotic heart disease of native coronary artery without angina pectoris: Secondary | ICD-10-CM

## 2022-01-06 NOTE — Progress Notes (Signed)
Referral placed.

## 2022-01-08 ENCOUNTER — Other Ambulatory Visit: Payer: Self-pay | Admitting: Internal Medicine

## 2022-01-15 ENCOUNTER — Other Ambulatory Visit: Payer: Self-pay | Admitting: Internal Medicine

## 2022-01-18 ENCOUNTER — Telehealth: Payer: Self-pay | Admitting: Dermatology

## 2022-01-18 NOTE — Telephone Encounter (Signed)
Patient is calling to report on how he is doing after using Ivermectin Ivermectin 1 % CREA  Patient states that it helps with redness, but still has breakouts especially around nose with some infection to some of those spots.  Patient wants to know what he should do next.

## 2022-01-20 MED ORDER — MINOCYCLINE HCL 50 MG PO CAPS: 50.0000 mg | ORAL_CAPSULE | Freq: Two times a day (BID) | ORAL | 1 refills | Status: DC

## 2022-01-20 NOTE — Telephone Encounter (Signed)
Phone call to patient with Dr. Onalee Hua recommendations. Patient aware, prescription sent to patient's pharmacy.

## 2022-01-27 ENCOUNTER — Encounter: Payer: Self-pay | Admitting: Internal Medicine

## 2022-01-28 NOTE — Telephone Encounter (Signed)
Schedule appt?

## 2022-01-31 ENCOUNTER — Other Ambulatory Visit: Payer: Self-pay | Admitting: Cardiovascular Disease

## 2022-02-01 ENCOUNTER — Ambulatory Visit (INDEPENDENT_AMBULATORY_CARE_PROVIDER_SITE_OTHER): Payer: BC Managed Care – PPO | Admitting: Internal Medicine

## 2022-02-01 ENCOUNTER — Encounter: Payer: Self-pay | Admitting: Internal Medicine

## 2022-02-01 ENCOUNTER — Other Ambulatory Visit: Payer: Self-pay

## 2022-02-01 VITALS — BP 142/80 | HR 70 | Temp 97.8°F | Wt 213.5 lb

## 2022-02-01 DIAGNOSIS — R5383 Other fatigue: Secondary | ICD-10-CM | POA: Diagnosis not present

## 2022-02-01 DIAGNOSIS — E039 Hypothyroidism, unspecified: Secondary | ICD-10-CM | POA: Diagnosis not present

## 2022-02-01 NOTE — Progress Notes (Signed)
° °  Subjective:    Patient ID: Ricky Sharp, male    DOB: 1961-03-01, 61 y.o.   MRN: [id number]  HPI 61 year old Male with history of hypothyroidism currently treated with levothyroxine 0.125 mcg daily in today to discuss having TSH checked in addition to T3 and T4.  He read an article by [facility] suggesting additional thyroid lab test if not feeling well.  He has had low energy, low motivation, increased need for sleep and feels a bit flat.  Has upcoming appointment with Dr. Acie Fredrickson, cardiologist.  History of hypertension.  Currently on HCTZ.  Losartan caused vertigo.  Beta-blockers cause fatigue and slow heart rate.  In April 2022 was started on Cardura 2 mg at bedtime and HCTZ 25 mg daily.  History of allergic rhinitis followed by Dr. Scherrie Bateman.  He is going through a divorce.  Adult daughters are doing well.  Continues to work in Engineer, technical sales at [employer].  Recently had severe abdominal pain January 13 and was seen in the emergency department.  He had large blood on urine dipstick and rare bacteria.  There was a long wait in the emergency department and he needed to go to [place] to pick up a friend at the airport.  He had had sudden onset of lower quadrant abdominal pain and drove himself to the emergency department.  CT did not show a kidney stone.  There was some mild thickening of colonic wall possibly mild colitis.  He has a small umbilical hernia.  Had no diverticulitis.  He subsequently did well.  It is my feeling he could have passed a kidney stone.  He had follow-up urine specimen here January 16 which was normal.  Review of Systems see above     Objective:   Physical Exam No thyromegaly.  No carotid bruits.       Assessment & Plan:  Fatigue-due to hypothyroidism or situational stress.  Have checked today TSH, free T4, T3 all of which proved to be normal.  History of hypothyroidism-treated with thyroid replacement therapy.  Labs pending.  Current dose of levothyroxine is 0.125 mg  daily  Recent emergency department visit for lower abdominal pain with hematuria.  Did not see kidney stone on CT.  Urine was repeated here on January 16 and was normal.  I think it is possible he passed a kidney stone while awaiting evaluation.  Situational stress with divorce  Allergic rhinitis  Anxiety treated with Xanax as needed  Essential hypertension-blood pressure controlled at home but elevated in doctor's office consistent with office hypertension.  Dr. Acie Fredrickson started him on Dilacor 240 mg daily with follow-up in 3 months.  He has been on HCTZ 25 mg daily.  Coronary calcium score elevated at 422.  LDL is 74.  Dr. Acie Fredrickson would like to see it at 37.  Rosuvastatin is being increased to 10 mg daily and patient will follow-up in 3 months.

## 2022-02-02 ENCOUNTER — Encounter: Payer: Self-pay | Admitting: Internal Medicine

## 2022-02-02 LAB — TSH: TSH: 1.74 mIU/L (ref 0.40–4.50)

## 2022-02-02 LAB — T4, FREE: Free T4: 1.4 ng/dL (ref 0.8–1.8)

## 2022-02-02 LAB — T3, FREE: T3, Free: 3.5 pg/mL (ref 2.3–4.2)

## 2022-02-02 MED ORDER — LEVOTHYROXINE SODIUM 125 MCG PO TABS: 125.0000 ug | ORAL_TABLET | Freq: Every day | ORAL | 1 refills | Status: DC

## 2022-02-07 ENCOUNTER — Encounter: Payer: Self-pay | Admitting: Cardiovascular Disease

## 2022-02-07 ENCOUNTER — Ambulatory Visit: Payer: BC Managed Care – PPO | Admitting: Cardiovascular Disease

## 2022-02-07 ENCOUNTER — Other Ambulatory Visit: Payer: Self-pay

## 2022-02-07 VITALS — BP 148/90 | HR 62 | Ht 70.5 in | Wt 213.4 lb

## 2022-02-07 DIAGNOSIS — E782 Mixed hyperlipidemia: Secondary | ICD-10-CM | POA: Diagnosis not present

## 2022-02-07 DIAGNOSIS — I2510 Atherosclerotic heart disease of native coronary artery without angina pectoris: Secondary | ICD-10-CM

## 2022-02-07 DIAGNOSIS — I10 Essential (primary) hypertension: Secondary | ICD-10-CM | POA: Diagnosis not present

## 2022-02-07 DIAGNOSIS — Z79899 Other long term (current) drug therapy: Secondary | ICD-10-CM

## 2022-02-07 DIAGNOSIS — I2584 Coronary atherosclerosis due to calcified coronary lesion: Secondary | ICD-10-CM

## 2022-02-07 MED ORDER — ROSUVASTATIN CALCIUM 10 MG PO TABS: 10.0000 mg | ORAL_TABLET | Freq: Every day | ORAL | 3 refills | Status: DC

## 2022-02-07 NOTE — Patient Instructions (Addendum)
Thyroid functions checked as requested and proved to be within normal limits.  Continue same dose of thyroid replacement medication.  Have reviewed Dr. Elmarie Shiley notes from visit and agree with his plans.  Patient has follow-up here in June.

## 2022-02-07 NOTE — Progress Notes (Signed)
Cardiology Office Note   Date:  02/07/2022   ID:  Nyxon, Strupp February 15, 1961, MRN [id number]  PCP:  Elby Showers, MD  Cardiologist:   Mertie Moores, MD  Chief Complaint  Patient presents with   Hypertension      previoius notes.  Ricky Sharp is a 61 y.o. male who presents for HTN  1. Hypertension 2. Anxiety - stress related 3. GERD    Patient is a 61 year old gentleman with a history of hypertension. He's tried several medications in the past that have not completely controlled his blood pressure.                - He has tried Losartan but had some dizziness / dry mouth.                - tried bystolic 5 mg  - felt poorly, persistant head ache, diarrhea, mild respiratory distress.  Tried for a week              He has also tried some antianxiety meds but had various reactions.    He has multiple family and life stresses.    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  He is also having some family health issues that require him to spend lots of time with them.  He has felt well on his HCTZ and potassium since his last visit.  He has not been exercising as much as he would like.  Oct. 1, 2014:   Ricky Sharp is a 61 yo with hx of HTN.  He has not been exercising.  He has gained 10 lbs.  He has tried Losartan but it caused vertigo.  He tried beta blockers but had severe fatigue and slow HR.   He does not want to start any new meds.  He knows that his weight is up and that is BP has been elevated.   March 04, 2014:  Ricky Sharp has been diagnosed with hypothyroidism since I last saw him.   He is feeling somewhat better.     Diastolic BP has still been a bit high.  Still eating some salt.   BP at home is typically is the  80s - occasionally upper 80s.    Oct. 1, 2015:  Ricky Sharp is now off the Losartan - was having lots of fatigue and diarrhea.   Symptoms are better.  On HCTZ and blood pressures are well controlled. Rides  his bike  on occasion. He also is taking Judo . Not  Having any episodes of chest pain BP has been ok  Jan. 21, 2016:  Ricky Sharp has had HTN that has been difficult to manage.  He developed GI complaints with Losartan and with Lisinopril.  He had significant diarrhea on the Lisinopril - has resolved.   April 03, 2015:  Briscoe has been seen in the hypertension clinic for the past several months. His blood pressure is now well controlled. No CP , no dyspnea. Still has anxiety.   April 04, 2016: Vane is doing well  Weight is up a little  Mother died , lots of stress.  Amlodipine still causes diarrhea - ( like clockwork)   May 18, 2017  BP has been well controled at home.   Still eating a little more salt than he should. Not as active.   Weight is staying elevated.  Mother died 2 years ago and he had a struggle  working through [employer].  No exercising   Jan. 8, 2018:  Ricky Sharp is seen today for follow up of his HTN. Exercising some.  Hoping to get out more . 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       December 20, 2017:  Ricky Sharp, seen today for further evaluation and management of his hypertension. No CP or dyspnea . Eats some salt,  Not much  Does not exercise  - HDL is 28 Had a reaction to Losartan and Lisinopril   March 20, 2020 Ricky Sharp is see back for follow up of his HTN and HLD Has had both covid vaccines.  Has gained a little weight.   Moderately active.  Has lack of energy . Stressed about his fathers death .  Was found to have a cerebral aneurism. - incidentally noted on a head scan   March 22, 2021 Ricky Sharp is seen back for follow up of his HTN and HLD  Under lots of stress,  Is separated.  Under lots of stress  Not exercising,  Not eating right ,  No Cp , no dyspnea  BP is mildly elevated because of his inactivity and less than ideal diet  Feb. 27 2023 Ricky Sharp is seen back today for follow up of his HTN and HLD  BP is a little  elevated.  BP at home looks ok  A coronary calcium score: Coronary calcium score of 422. This was 24 percentile for age-, race-, and sex-matched controls.  Has had a a stress test in the past - was negative  Is getting some exercise,  no CP with exercise      Wt Readings from Last 3 Encounters:  02/07/22 213 lb 6.4 oz (96.8 kg)  02/01/22 213 lb 8 oz (96.8 kg)  12/27/21 213 lb (96.6 kg)     Past Medical History:  Diagnosis Date   Anxiety    Atypical mole 10/28/2004   left upper shin outer cx3 42fu moderate tx w/s   Atypical mole 05/13/2005   right outer lower back no tx sight   Atypical mole 11/27/2008   left low lateral back slight   Atypical mole 11/27/2008   left low medial back slight   Atypical mole 11/27/2008   left thigh sup moderate   Atypical mole 11/27/2008   left thigh inf slight   Atypical mole 10/04/2016   right med back moderate   Eczema    Hypertension    Hypothyroidism    Recurrent upper respiratory infection (URI)    SCC (squamous cell carcinoma) 09/04/2019   left mid shin outer tx cx3 31fu   SCCA (squamous cell carcinoma) of skin 10/03/2019   left calf inf to watch   SCCA (squamous cell carcinoma) of skin 10/03/2019   left calf sup to watch   SCCA (squamous cell carcinoma) of skin 10/03/2019   right inf calf to watch   Sleep apnea    borderline, uses dental device   Squamous cell carcinoma of skin 09/04/2019   left upper shin outer tx cx3 31fu    Past Surgical History:  Procedure Laterality Date   CHOLECYSTECTOMY     OSTEOCHONDROMA EXCISION     TONSILLECTOMY     TRIGGER FINGER RELEASE Right 11/26/2019   Procedure: RELEASE TRIGGER FINGER/A-1 PULLEY RIGHT MIDDLE FINGER;  Surgeon: Daryll Brod, MD;  Location: [facility];  Service: Orthopedics;  Laterality: Right;  IV REGIONAL FOREARM BLOCK   WISDOM TOOTH  EXTRACTION       Current Outpatient Medications  Medication Sig Dispense Refill   ALPRAZolam (XANAX) 0.25 MG tablet TAKE 1  TABLET TWICE DAILY AS NEEDED FOR ANXIETY. 60 tablet 2   diltiazem (DILACOR XR) 240 MG 24 hr capsule TAKE (1) CAPSULE DAILY. 30 capsule 0   doxazosin (CARDURA) 2 MG tablet Take 1 tablet (2 mg total) by mouth at bedtime. 90 tablet 3   halobetasol (ULTRAVATE) 0.05 % cream Apply 1 application topically as needed.      hydrochlorothiazide (HYDRODIURIL) 25 MG tablet Take 1 tablet (25 mg total) by mouth daily. 90 tablet 3   Ivermectin 1 % CREA Apply to affected area qd 45 g 3   levothyroxine (SYNTHROID) 125 MCG tablet Take 1 tablet (125 mcg total) by mouth daily. 90 tablet 1   minocycline (MINOCIN) 50 MG capsule Take 1 capsule (50 mg total) by mouth 2 (two) times daily. 30 capsule 1   potassium chloride (KLOR-CON) 10 MEQ tablet Take 2 tablets (20 mEq total) by mouth 2 (two) times daily. 360 tablet 3   rosuvastatin (CRESTOR) 10 MG tablet Take 1 tablet (10 mg total) by mouth daily. 90 tablet 3   XHANCE 93 MCG/ACT EXHU BLOW 2 DOSES IN EACH NOSTRIL TWICE DAILY 16 mL 0   No current facility-administered medications for this visit.    Allergies:   Lisinopril and Losartan    Social History:  The patient  reports that he has never smoked. He has never used smokeless tobacco. He reports current alcohol use of about 2.0 standard drinks per week. He reports that he does not use drugs.   Family History:  The patient's family history includes Fibromyalgia in his mother; Lung disease in his mother; Syncope episode in his mother. He was adopted.   ROS:   Discussed in H&P.  Otherwise negative   Physical Exam: Blood pressure (!) 148/90, pulse 62, height 5' 10.5" (1.791 m), weight 213 lb 6.4 oz (96.8 kg), SpO2 98 %.  GEN:  Well nourished, well developed in no acute distress HEENT: Normal NECK: No JVD; No carotid bruits LYMPHATICS: No lymphadenopathy CARDIAC: RRR , no murmurs, rubs, gallops RESPIRATORY:  Clear to auscultation without rales, wheezing or rhonchi  ABDOMEN: Soft, non-tender,  non-distended MUSCULOSKELETAL:  No edema; No deformity  SKIN: Warm and dry NEUROLOGIC:  Alert and oriented x 3    EKG:    February 07, 2022: Normal sinus rhythm at 62.  No ST or T wave changes.   Recent Labs: 12/24/2021: ALT 33; BUN 17; Creatinine, Ser 1.25; Hemoglobin 16.4; Platelets 246; Potassium 3.3; Sodium 137 02/01/2022: TSH 1.74    Lipid Panel    Component Value Date/Time   CHOL 133 11/22/2021 0922   CHOL 124 03/22/2021 0844   TRIG 159 (H) 11/22/2021 0922   HDL 35 (L) 11/22/2021 0922   HDL 30 (L) 03/22/2021 0844   CHOLHDL 3.8 11/22/2021 0922   VLDL 61 (H) 12/13/2016 1135   LDLCALC 74 11/22/2021 0922      Wt Readings from Last 3 Encounters:  02/07/22 213 lb 6.4 oz (96.8 kg)  02/01/22 213 lb 8 oz (96.8 kg)  12/27/21 213 lb (96.6 kg)      Other studies Reviewed: Additional studies/ records that were reviewed today include: . Review of the above records demonstrates:    ASSESSMENT AND PLAN:  1.  Essential  Hypertension: BP is well controlled at home    2.  Hyperlipidemia :    His last  LDL is 74.  He has a coronary calcium score of 422.  I would like to see his LDL around 50.  We will increase the rosuvastatin to 10 mg a day.  We will have him return in 3 months for repeat lipid levels.  If his LDL is still above 50 I think we should consider adding Zetia versus referring him to the lipid clinic for consideration of a PCSK9 inhibitor.     3.  Cerebral aneurism:  Incidentally discovered.      Current medicines are reviewed at length with the patient today.  The patient does not have concerns regarding medicines.  The following changes have been made:      Labs/ tests ordered today include:   Orders Placed This Encounter  Procedures   Lipid panel   ALT   Basic metabolic panel   EKG 54-YTKP    Disposition:       Signed, Mertie Moores, MD  02/07/2022 3:03 PM    [facility] [place], [place], [place]  [address] Phone:  [phone]; Fax: [phone]

## 2022-02-07 NOTE — Patient Instructions (Signed)
Medication Instructions:  Please increase your Rosuvastatin to 10 mg a day. Continue all other medications as listed.  *If you need a refill on your cardiac medications before your next appointment, please call your pharmacy*  Lab Work: Please have blood work in 3 months (Lipid, ALT and BMP)  If you have labs (blood work) drawn today and your tests are completely normal, you will receive your results only by: [place] (if you have MyChart) OR A paper copy in the mail If you have any lab test that is abnormal or we need to change your treatment, we will call you to review the results.  Follow-Up: At [facility], you and your health needs are our priority.  As part of our continuing mission to provide you with exceptional heart care, we have created designated Provider Care Teams.  These Care Teams include your primary Cardiologist (physician) and Advanced Practice Providers (APPs -  Physician Assistants and Nurse Practitioners) who all work together to provide you with the care you need, when you need it.  We recommend signing up for the patient portal called "MyChart".  Sign up information is provided on this After Visit Summary.  MyChart is used to connect with patients for Virtual Visits (Telemedicine).  Patients are able to view lab/test results, encounter notes, upcoming appointments, etc.  Non-urgent messages can be sent to your provider as well.   To learn more about what you can do with MyChart, go to [email or web address].    Your next appointment:   6 month(s)  The format for your next appointment:   In Person  Provider:   Christen Bame, NP or Richardson Dopp, PA-C         Thank you for choosing [facility]!!

## 2022-02-16 ENCOUNTER — Encounter: Payer: Self-pay | Admitting: Dermatology

## 2022-03-01 ENCOUNTER — Other Ambulatory Visit: Payer: Self-pay | Admitting: Cardiovascular Disease

## 2022-03-14 ENCOUNTER — Other Ambulatory Visit: Payer: BC Managed Care – PPO

## 2022-03-14 DIAGNOSIS — E039 Hypothyroidism, unspecified: Secondary | ICD-10-CM

## 2022-03-15 LAB — TSH: TSH: 1.03 mIU/L (ref 0.40–4.50)

## 2022-05-05 ENCOUNTER — Other Ambulatory Visit: Payer: Self-pay | Admitting: Cardiovascular Disease

## 2022-05-10 ENCOUNTER — Other Ambulatory Visit: Payer: BC Managed Care – PPO | Admitting: *Deleted

## 2022-05-10 DIAGNOSIS — E782 Mixed hyperlipidemia: Secondary | ICD-10-CM

## 2022-05-10 DIAGNOSIS — Z79899 Other long term (current) drug therapy: Secondary | ICD-10-CM

## 2022-05-10 DIAGNOSIS — I10 Essential (primary) hypertension: Secondary | ICD-10-CM

## 2022-05-10 LAB — BASIC METABOLIC PANEL
BUN/Creatinine Ratio: 15 (ref 10–24)
BUN: 17 mg/dL (ref 8–27)
CO2: 23 mmol/L (ref 20–29)
Calcium: 9.3 mg/dL (ref 8.6–10.2)
Chloride: 102 mmol/L (ref 96–106)
Creatinine, Ser: 1.12 mg/dL (ref 0.76–1.27)
Glucose: 99 mg/dL (ref 70–99)
Potassium: 3.6 mmol/L (ref 3.5–5.2)
Sodium: 140 mmol/L (ref 134–144)
eGFR: 74 mL/min/{1.73_m2} (ref >59)

## 2022-05-10 LAB — LIPID PANEL
Chol/HDL Ratio: 3.7 ratio (ref 0.0–5.0)
Cholesterol, Total: 111 mg/dL (ref 100–199)
HDL: 30 mg/dL — ABNORMAL LOW (ref >39)
LDL Chol Calc (NIH): 51 mg/dL (ref 0–99)
Triglycerides: 179 mg/dL — ABNORMAL HIGH (ref 0–149)
VLDL Cholesterol Cal: 30 mg/dL (ref 5–40)

## 2022-05-10 LAB — ALT: ALT: 41 IU/L (ref 0–44)

## 2022-05-24 ENCOUNTER — Other Ambulatory Visit: Payer: BC Managed Care – PPO

## 2022-05-24 DIAGNOSIS — E781 Pure hyperglyceridemia: Secondary | ICD-10-CM

## 2022-05-24 DIAGNOSIS — I10 Essential (primary) hypertension: Secondary | ICD-10-CM

## 2022-05-24 DIAGNOSIS — E039 Hypothyroidism, unspecified: Secondary | ICD-10-CM

## 2022-05-25 LAB — COMPLETE METABOLIC PANEL WITH GFR
AG Ratio: 1.7 (calc) (ref 1.0–2.5)
ALT: 39 U/L (ref 9–46)
AST: 25 U/L (ref 10–35)
Albumin: 4.7 g/dL (ref 3.6–5.1)
Alkaline phosphatase (APISO): 64 U/L (ref 35–144)
BUN: 17 mg/dL (ref 7–25)
CO2: 29 mmol/L (ref 20–32)
Calcium: 9.7 mg/dL (ref 8.6–10.3)
Chloride: 102 mmol/L (ref 98–110)
Creat: 1.2 mg/dL (ref 0.70–1.35)
Globulin: 2.8 g/dL (calc) (ref 1.9–3.7)
Glucose, Bld: 93 mg/dL (ref 65–99)
Potassium: 4 mmol/L (ref 3.5–5.3)
Sodium: 140 mmol/L (ref 135–146)
Total Bilirubin: 0.7 mg/dL (ref 0.2–1.2)
Total Protein: 7.5 g/dL (ref 6.1–8.1)
eGFR: 68 mL/min/{1.73_m2} (ref >=60)

## 2022-05-25 LAB — CBC WITH DIFFERENTIAL/PLATELET
Absolute Monocytes: 557 cells/uL (ref 200–950)
Basophils Absolute: 58 cells/uL (ref 0–200)
Basophils Relative: 1.1 %
Eosinophils Absolute: 207 cells/uL (ref 15–500)
Eosinophils Relative: 3.9 %
HCT: 49.1 % (ref 38.5–50.0)
Hemoglobin: 16.7 g/dL (ref 13.2–17.1)
Lymphs Abs: 1442 cells/uL (ref 850–3900)
MCH: 30.3 pg (ref 27.0–33.0)
MCHC: 34 g/dL (ref 32.0–36.0)
MCV: 89.1 fL (ref 80.0–100.0)
MPV: 10.3 fL (ref 7.5–12.5)
Monocytes Relative: 10.5 %
Neutro Abs: 3037 cells/uL (ref 1500–7800)
Neutrophils Relative %: 57.3 %
Platelets: 277 10*3/uL (ref 140–400)
RBC: 5.51 10*6/uL (ref 4.20–5.80)
RDW: 13.3 % (ref 11.0–15.0)
Total Lymphocyte: 27.2 %
WBC: 5.3 10*3/uL (ref 3.8–10.8)

## 2022-05-25 LAB — LIPID PANEL
Cholesterol: 105 mg/dL (ref <200)
HDL: 35 mg/dL — ABNORMAL LOW (ref >=40)
LDL Cholesterol (Calc): 46 mg/dL (calc)
Non-HDL Cholesterol (Calc): 70 mg/dL (calc) (ref <130)
Total CHOL/HDL Ratio: 3 (calc) (ref <5.0)
Triglycerides: 154 mg/dL — ABNORMAL HIGH (ref <150)

## 2022-05-25 LAB — TSH: TSH: 1.5 mIU/L (ref 0.40–4.50)

## 2022-05-30 ENCOUNTER — Encounter: Payer: Self-pay | Admitting: Internal Medicine

## 2022-05-30 ENCOUNTER — Ambulatory Visit: Payer: BC Managed Care – PPO | Admitting: Internal Medicine

## 2022-05-30 VITALS — BP 138/86 | HR 65 | Temp 97.8°F | Ht 70.5 in | Wt 213.0 lb

## 2022-05-30 DIAGNOSIS — E781 Pure hyperglyceridemia: Secondary | ICD-10-CM | POA: Diagnosis not present

## 2022-05-30 DIAGNOSIS — E039 Hypothyroidism, unspecified: Secondary | ICD-10-CM

## 2022-05-30 DIAGNOSIS — Z6830 Body mass index (BMI) 30.0-30.9, adult: Secondary | ICD-10-CM

## 2022-05-30 DIAGNOSIS — I10 Essential (primary) hypertension: Secondary | ICD-10-CM

## 2022-05-30 DIAGNOSIS — F419 Anxiety disorder, unspecified: Secondary | ICD-10-CM

## 2022-05-30 NOTE — Progress Notes (Signed)
   Subjective:    Patient ID: Ricky Sharp, male    DOB: 08-24-1961, 61 y.o.   MRN: [id number]  HPI  61 year old Male seen for 6 month follow up.  History of essential hypertension, sleep apnea treated with CPAP, hypothyroidism.  History of anxiety treated with Xanax sparingly as needed.  Hypertension treated with HCTZ, Cardura, developed for per Dr. Acie Fredrickson.  Also takes potassium supplement.  History of hyperlipidemia treated with Crestor 10 mg daily.  Recent TSH is normal at 1.50, CBC with differential is normal, fasting glucose is normal at 93.  BUN and creatinine are normal, electrolytes and liver functions are normal.  Lipid panel is mostly normal.  History of low HDL.  It is currently 35.  Triglycerides slightly elevated at 150.  These are improved from previous readings.  LDL is normal at 46.  History of cerebral aneurysm incidentally discovered  Review of Systems no new complaints-hoping to have some time with his daughters this summer on a trip.  Patient saw Dr. Acie Fredrickson in February.  He had a coronary calcium score 422.  He has had a stress test in the remote past that was negative.  Dr. Acie Fredrickson recommends adding Zetia if LDL is still above 50 or referring him to lipid clinic.     Objective:   Physical Exam  Blood pressure 138/86 pulse 65 temperature 97.8 pulse oximetry 98% weight 213 pounds height 5 feet 10.5 inches BMI 30.13  Skin: Warm and dry.  No cervical adenopathy or carotid bruits.  Chest is clear to auscultation.  Cardiac exam: Regular rate and rhythm without ectopy or murmurs.  No lower extremity edema.    Assessment & Plan:  Essential hypertension-stable on current regimen.  Tends to be better at home.  Continue to monitor.  Hyperlipidemia-currently under good control and will not add Zetia  BMI 30.13 would like to see this closer to 25.  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 Maurice Small  Coronary calcium score of 422-is on statin therapy.  Continue follow-up with Dr. Acie Fredrickson.  Health maintenance-colonoscopy is up-to-date.  Vaccines discussed including Shingrix vaccines.  Return in 6 months for health maintenance exam.

## 2022-05-30 NOTE — Patient Instructions (Signed)
It was a pleasure to see you today. Continue same meds. Vaccines discussed including Shingrix.  Colonoscopy up to date. Labs are stable. Continue diet and exercise efforts. RTC in 6 months for health maintenance exam.

## 2022-05-31 ENCOUNTER — Other Ambulatory Visit: Payer: Self-pay | Admitting: Cardiovascular Disease

## 2022-07-11 ENCOUNTER — Ambulatory Visit: Payer: BC Managed Care – PPO | Admitting: Dermatology

## 2022-07-19 ENCOUNTER — Ambulatory Visit: Payer: BC Managed Care – PPO | Admitting: Dermatology

## 2022-07-19 NOTE — Progress Notes (Signed)
Cardiology Office Note    Date:  07/26/2022   ID:  Ricky Sharp, DOB [birth year], MRN [id number]   PCP:  Elby Showers, MD   [facility]  Cardiologist:  Mertie Moores, MD   Advanced Practice Provider:  No care team member to display Electrophysiologist:  None   [phone]   No chief complaint on file.   History of Present Illness:  Ricky Sharp is a 61 y.o. male with history of HTN, HLD, chest pain-coronary calcium score 12/2021 422.   Last saw Dr. Acie Fredrickson 01/2022.  Patient comes in for f/u. Works in Engineer, technical sales. Bikes and works out of the [employer], light lifting 3-4 days/week. LDL 46 05/2022. Trig 154.Denies chest pain, shortness of breath, dizziness, edema.     Past Medical History:  Diagnosis Date   Anxiety    Atypical mole 10/28/2004   left upper shin outer cx3 66f moderate tx w/s   Atypical mole 05/13/2005   right outer lower back no tx sight   Atypical mole 11/27/2008   left low lateral back slight   Atypical mole 11/27/2008   left low medial back slight   Atypical mole 11/27/2008   left thigh sup moderate   Atypical mole 11/27/2008   left thigh inf slight   Atypical mole 10/04/2016   right med back moderate   Eczema    Hypertension    Hypothyroidism    Recurrent upper respiratory infection (URI)    SCC (squamous cell carcinoma) 09/04/2019   left mid shin outer tx cx3 54f  SCCA (squamous cell carcinoma) of skin 10/03/2019   left calf inf to watch   SCCA (squamous cell carcinoma) of skin 10/03/2019   left calf sup to watch   SCCA (squamous cell carcinoma) of skin 10/03/2019   right inf calf to watch   Sleep apnea    borderline, uses dental device   Squamous cell carcinoma of skin 09/04/2019   left upper shin outer tx cx3 [id number]  Past Surgical History:  Procedure Laterality Date   CHOLECYSTECTOMY     OSTEOCHONDROMA EXCISION     TONSILLECTOMY     TRIGGER FINGER RELEASE Right 11/26/2019   Procedure: RELEASE TRIGGER FINGER/A-1  PULLEY RIGHT MIDDLE FINGER;  Surgeon: KuzDaryll BrodD;  [facility];  Laterality: Right;  IV REGIONAL FOREARM BLOCK   WISDOM TOOTH EXTRACTION      Current Medications: Current Meds  Medication Sig   ALPRAZolam (XANAX) 0.25 MG tablet TAKE 1 TABLET TWICE DAILY AS NEEDED FOR ANXIETY.   diltiazem (DILACOR XR) 240 MG 24 hr capsule TAKE (1) CAPSULE DAILY.   doxazosin (CARDURA) 2 MG tablet TAKE ONE TABLET BY MOUTH AT BEDTIME   hydrochlorothiazide (HYDRODIURIL) 25 MG tablet TAKE ONE TABLET BY MOUTH DAILY.   Ivermectin 1 % CREA Apply to affected area qd   levothyroxine (SYNTHROID) 125 MCG tablet Take 1 tablet (125 mcg total) by mouth daily.   potassium chloride (KLOR-CON) 10 MEQ tablet Take 2 tablets (20 mEq total) by mouth 2 (two) times daily.   rosuvastatin (CRESTOR) 10 MG tablet Take 1 tablet (10 mg total) by mouth daily.     Allergies:   Lisinopril and Losartan   Social History   Socioeconomic History   Marital status: Legally Separated    Spouse name: Not on file   Number of children: Not on file   Years of education: Not on file   Highest education  level: Not on file  Occupational History   Occupation: network admin  Tobacco Use   Smoking status: Never   Smokeless tobacco: Never  Vaping Use   Vaping Use: Never used  Substance and Sexual Activity   Alcohol use: Yes    Alcohol/week: 2.0 standard drinks of alcohol    Types: 2 Glasses of wine per week   Drug use: No   Sexual activity: Not on file  Other Topics Concern   Not on file  Social History Narrative   Not on file   Social Determinants of Health   Financial Resource Strain: Not on file  Food Insecurity: Not on file  Transportation Needs: Not on file  Physical Activity: Not on file  Stress: Not on file  Social Connections: Not on file     Family History:  The patient's  family history includes Fibromyalgia in his mother; Lung disease in his mother; Syncope episode in  his mother. He was adopted.   ROS:   Please see the history of present illness.    ROS All other systems reviewed and are negative.   PHYSICAL EXAM:   VS:  BP 124/86   Pulse 75   Ht '5\' 10"'$  (1.778 m)   Wt 207 lb 12.8 oz (94.3 kg)   SpO2 96%   BMI 29.82 kg/m   Physical Exam  Neck: no JVD, carotid bruits, or masses Cardiac:RRR; no murmurs, rubs, or gallops  Respiratory:  clear to auscultation bilaterally, normal work of breathing GI: soft, nontender, nondistended, + BS Ext: without cyanosis, clubbing, or edema, Good distal pulses bilaterally Neuro:  Alert and Oriented x 3, Psych: euthymic mood, full affect  Wt Readings from Last 3 Encounters:  07/26/22 207 lb 12.8 oz (94.3 kg)  05/30/22 213 lb (96.6 kg)  02/07/22 213 lb 6.4 oz (96.8 kg)      Studies/Labs Reviewed:   EKG:  EKG is not ordered today.    Recent Labs: 05/24/2022: ALT 39; BUN 17; Creat 1.20; Hemoglobin 16.7; Platelets 277; Potassium 4.0; Sodium 140; TSH 1.50   Lipid Panel    Component Value Date/Time   CHOL 105 05/24/2022 1121   CHOL 111 05/10/2022 0741   TRIG 154 (H) 05/24/2022 1121   HDL 35 (L) 05/24/2022 1121   HDL 30 (L) 05/10/2022 0741   CHOLHDL 3.0 05/24/2022 1121   VLDL 61 (H) 12/13/2016 1135   LDLCALC 46 05/24/2022 1121    Additional studies/ records that were reviewed today include:  Coronary calcium score 12/2021 FINDINGS: Coronary arteries: Normal origins.   Coronary Calcium Score:   Left main: 0   Left anterior descending artery: 406   Left circumflex artery: 0   Right coronary artery: 16.2   Total: 422   Percentile: 97   Pericardium: Normal.   Ascending Aorta: Normal caliber.   Non-cardiac: See separate report from [facility].   IMPRESSION: Coronary calcium score of 422. This was 17 percentile for age-, race-, and sex-matched controls.      Risk Assessment/Calculations:         ASSESSMENT:    1. Coronary artery calcification   2. Essential  hypertension   3. Mixed hyperlipidemia      PLAN:  In order of problems listed above:  HTN well controlled  HLD LDL 46, trig 153 05/2022 on crestor. Reduce sugars in diet.  Coronary calcium on CT score 422 on crestor, exercising regularly and no chest pain  Shared Decision Making/Informed Consent  Medication Adjustments/Labs and Tests Ordered: Current medicines are reviewed at length with the patient today.  Concerns regarding medicines are outlined above.  Medication changes, Labs and Tests ordered today are listed in the Patient Instructions below. Patient Instructions  Medication Instructions:  Your physician recommends that you continue on your current medications as directed. Please refer to the Current Medication list given to you today.  *If you need a refill on your cardiac medications before your next appointment, please call your pharmacy*   Lab Work: NONE If you have labs (blood work) drawn today and your tests are completely normal, you will receive your results only by: [facility] (if you have MyChart) OR A paper copy in the mail If you have any lab test that is abnormal or we need to change your treatment, we will call you to review the results.   Testing/Procedures: NONE   Follow-Up: At [facility], you and your health needs are our priority.  As part of our continuing mission to provide you with exceptional heart care, we have created designated Provider Care Teams.  These Care Teams include your primary Cardiologist (physician) and Advanced Practice Providers (APPs -  Physician Assistants and Nurse Practitioners) who all work together to provide you with the care you need, when you need it.  We recommend signing up for the patient portal called "MyChart".  Sign up information is provided on this After Visit Summary.  MyChart is used to connect with patients for Virtual Visits (Telemedicine).  Patients are able to view lab/test results, encounter  notes, upcoming appointments, etc.  Non-urgent messages can be sent to your provider as well.   To learn more about what you can do with MyChart, go to [email or web address].    Your next appointment:   1 year(s)  The format for your next appointment:   In Person  Provider:   Mertie Moores, MD {    Important Information About Sugar        Signed, Ermalinda Barrios, PA-C  07/26/2022 10:04 AM    [address], [place], [place]  [address] Phone: [phone]; Fax: [phone]

## 2022-07-26 ENCOUNTER — Ambulatory Visit: Payer: BC Managed Care – PPO | Admitting: Internal Medicine

## 2022-07-26 ENCOUNTER — Ambulatory Visit: Payer: BC Managed Care – PPO | Admitting: Physician Assistant

## 2022-07-26 ENCOUNTER — Encounter: Payer: Self-pay | Admitting: Internal Medicine

## 2022-07-26 ENCOUNTER — Encounter: Payer: Self-pay | Admitting: Physician Assistant

## 2022-07-26 VITALS — BP 130/80 | HR 77 | Temp 97.7°F

## 2022-07-26 VITALS — BP 124/86 | HR 75 | Ht 70.0 in | Wt 207.8 lb

## 2022-07-26 DIAGNOSIS — I10 Essential (primary) hypertension: Secondary | ICD-10-CM

## 2022-07-26 DIAGNOSIS — R0989 Other specified symptoms and signs involving the circulatory and respiratory systems: Secondary | ICD-10-CM

## 2022-07-26 DIAGNOSIS — I2510 Atherosclerotic heart disease of native coronary artery without angina pectoris: Secondary | ICD-10-CM | POA: Diagnosis not present

## 2022-07-26 DIAGNOSIS — E782 Mixed hyperlipidemia: Secondary | ICD-10-CM | POA: Diagnosis not present

## 2022-07-26 DIAGNOSIS — I2584 Coronary atherosclerosis due to calcified coronary lesion: Secondary | ICD-10-CM | POA: Diagnosis not present

## 2022-07-26 MED ORDER — CEFTRIAXONE SODIUM 1 G IJ SOLR
1.0000 g | Freq: Once | INTRAMUSCULAR | Status: AC
  Administered 2022-07-26: 1 g via INTRAMUSCULAR

## 2022-07-26 MED ORDER — HYDROCODONE BIT-HOMATROP MBR 5-1.5 MG/5ML PO SOLN: 5.0000 mL | Freq: Three times a day (TID) | ORAL | 0 refills | Status: DC | PRN

## 2022-07-26 MED ORDER — DOXYCYCLINE HYCLATE 100 MG PO TABS: 100.0000 mg | ORAL_TABLET | Freq: Two times a day (BID) | ORAL | 0 refills | Status: DC

## 2022-07-26 NOTE — Patient Instructions (Signed)

## 2022-07-27 ENCOUNTER — Other Ambulatory Visit: Payer: Self-pay | Admitting: Internal Medicine

## 2022-07-27 ENCOUNTER — Ambulatory Visit
Admission: RE | Admit: 2022-07-27 | Discharge: 2022-07-27 | Disposition: A | Discharge status: Home / Self Care | Payer: BC Managed Care – PPO | Source: Ambulatory Visit | Attending: Internal Medicine | Admitting: Internal Medicine

## 2022-07-27 NOTE — Progress Notes (Signed)
   Subjective:    Patient ID: YUTO CAJUSTE, male    DOB: Dec 19, 1960, 61 y.o.   MRN: [id number]  HPI 61 year old Male seen today in person with an acute respiratory infection with significant coughing that is productive.  No known COVID exposure and he has tested negative for COVID-19 at home.  Has some malaise and fatigue.  No recent travel history.  His general health is quite good.  He has a history of hypothyroidism, hypertension and anxiety.  He has a history of asthma and has Ventolin inhaler on hand.  He takes Crestor for hyperlipidemia and takes diltiazem, Cardura and HCTZ for hypertension.    Review of Systems see above denies fever or shaking chills.  No nausea or vomiting.  He does have malaise and fatigue and congested cough     Objective:   Physical Exam Vital signs reviewed.  He is afebrile. Blood pressure 130/88 pulse 77 regular temperature 97.7 degrees pulse oximetry 98%  Skin: Warm and dry.  TMs are clear.  Pharynx very slightly injected without exudate.  Neck is supple.  Chest exam remarkable for inspiratory rales halfway up chest on the left.  However, chest x-ray showed no evidence of pneumonia      Assessment & Plan:   Acute lower respiratory infection  Plan: He was given 1 g IM Rocephin in the office.  He has Ventolin inhaler to use.  He was prescribed Hycodan for cough 1 teaspoon every 8 hours as needed.  Chest x-ray ordered and results reviewed.  Take Levaquin 500 mg daily for 7 days.

## 2022-08-04 ENCOUNTER — Encounter: Payer: Self-pay | Admitting: Internal Medicine

## 2022-08-04 ENCOUNTER — Telehealth (INDEPENDENT_AMBULATORY_CARE_PROVIDER_SITE_OTHER): Payer: BC Managed Care – PPO | Admitting: Internal Medicine

## 2022-08-04 DIAGNOSIS — Z8709 Personal history of other diseases of the respiratory system: Secondary | ICD-10-CM | POA: Diagnosis not present

## 2022-08-04 DIAGNOSIS — B9689 Other specified bacterial agents as the cause of diseases classified elsewhere: Secondary | ICD-10-CM

## 2022-08-04 DIAGNOSIS — J42 Unspecified chronic bronchitis: Secondary | ICD-10-CM | POA: Diagnosis not present

## 2022-08-04 MED ORDER — ALBUTEROL SULFATE HFA 108 (90 BASE) MCG/ACT IN AERS: 2.0000 | INHALATION_SPRAY | Freq: Four times a day (QID) | RESPIRATORY_TRACT | 2 refills | Status: AC | PRN

## 2022-08-04 MED ORDER — METHYLPREDNISOLONE 4 MG PO TABS: ORAL_TABLET | ORAL | 0 refills | Status: DC

## 2022-08-04 MED ORDER — DOXYCYCLINE HYCLATE 100 MG PO TABS: 100.0000 mg | ORAL_TABLET | Freq: Two times a day (BID) | ORAL | 0 refills | Status: DC

## 2022-08-04 NOTE — Progress Notes (Signed)
    Subjective:    Patient ID: Ricky Sharp , male    DOB: 10-12-1961, 61 y.o.        HPI 61 year old Male was seen  in person on August 15 with cough and congestion for several days. Had malaise and fatigue. Cough is productive.He reported low grade fever and fatigue.  He had rales halfway up on the left posterior chest wall.  Chest x-ray did not show infiltrate.  He was given 1 g IM Rocephin and started on doxycycline 100 mg twice daily for 10 days and Hycodan cough syrup to take 1 teaspoon every 8 hours as needed for cough.  He had some improvement but called today complaining of persistent productive cough.  Still has some fatigue.  He has a history of hypertension.  He is on Dilacor XR 240 mg daily, Cardura 2 mg daily and HCTZ 25 mg daily.  He is on rosuvastatin 10 mg daily.  He is seen by Dr. Acie Fredrickson, Cardiologist.  History of allergic rhinitis seen by Dr. Scherrie Bateman.  History of hypothyroidism treated with levothyroxine 125 mcg daily.  Episode of severe abdominal pain December 24, 2021.  CT did not show a kidney stone.  He had large occult blood on urine dipstick and rare bacteria.  He may have passed a kidney stone.  There was a long wait in the emergency department and he never saw a physician.  He left and subsequently felt better.  Today he is seen via interactive audio and video telecommunications.  He is identified using 2 identifiers as Ricky Sharp, a patient in this practice.  He is at his home and I am at my office.  He is agreeable to visit in this format today.        ROS see above malaise and fatigue with productive cough.  Reports that he has improved some but is not 100% better.      Objective:   There were no vitals filed for this visit.   Physical Exam  He looks fatigued but does not appear to be tachypneic and has no audible wheezing.     Assessment & Plan:   Persistent lower respiratory infection  Plan: We discussed treatment options.  We agree that an  albuterol inhaler may be helpful and this was prescribed today.  Patient feels that doxycycline was helpful and a 10-day course was refilled today.  He does have Hycodan leftover from visit on August 15.  We are going to prescribe a Medrol 4 mg 12-day taper starting with 6 tablets day 1 and decrease by 1 tablet every 2 days i.e. 6-6-5-5-4-4-3-3-2-2-1-1 taper.  Use Ventolin inhaler up to 4 times daily

## 2022-08-04 NOTE — Patient Instructions (Addendum)
Patient will take 12-day Medrol 4 mg taper starting with 6 tablets day 1 and decreasing by 1 tablet every 2 days.  Doxycycline will be refilled for 10-day course 100 mg twice daily.  A Ventolin inhaler was also prescribed to use up to 4 times daily.  He does have leftover Hycodan to use if needed for cough.

## 2022-08-07 NOTE — Patient Instructions (Addendum)
Take Levaquin 500 mg daily for 7 days.  Hycodan 1 teaspoon every 8 hours as needed for cough.  Have chest x-ray.  1 g IM Rocephin given in the office.  Use Ventolin inhaler.  Rest and drink fluids.  Addendum: Chest x-ray is negative

## 2022-10-03 ENCOUNTER — Ambulatory Visit (INDEPENDENT_AMBULATORY_CARE_PROVIDER_SITE_OTHER): Payer: BC Managed Care – PPO

## 2022-10-03 VITALS — BP 122/80 | Temp 98.1°F

## 2022-10-03 DIAGNOSIS — Z23 Encounter for immunization: Secondary | ICD-10-CM

## 2022-10-03 DIAGNOSIS — Z7185 Encounter for immunization safety counseling: Secondary | ICD-10-CM | POA: Diagnosis not present

## 2022-11-02 ENCOUNTER — Ambulatory Visit: Payer: BC Managed Care – PPO | Admitting: Internal Medicine

## 2022-11-02 ENCOUNTER — Encounter: Payer: Self-pay | Admitting: Internal Medicine

## 2022-11-02 ENCOUNTER — Telehealth: Payer: Self-pay | Admitting: Internal Medicine

## 2022-11-02 VITALS — BP 134/78 | HR 83 | Temp 99.1°F

## 2022-11-02 DIAGNOSIS — J22 Unspecified acute lower respiratory infection: Secondary | ICD-10-CM

## 2022-11-02 MED ORDER — MINOCYCLINE HCL 50 MG PO CAPS: 50.0000 mg | ORAL_CAPSULE | Freq: Two times a day (BID) | ORAL | 1 refills | Status: AC

## 2022-11-02 MED ORDER — METHYLPREDNISOLONE 4 MG PO TABS: ORAL_TABLET | ORAL | 1 refills | Status: DC

## 2022-11-02 NOTE — Telephone Encounter (Signed)
Ricky Sharp [phone]   [name] called today with Fever (99.5), achey,head congestion, dark greenish mucus, negative COVID today. This all started on Friday, he has been taking medication at home but is not getting any better. Scheduled him for this morning.

## 2022-11-02 NOTE — Progress Notes (Signed)
   Subjective:    Patient ID: Ricky Sharp, male    DOB: [birth year], 61 y.o.   MRN: [id number]  HPI Patient seen acutely today with low-grade fever 99.5 degrees, muscle aches, head congestion, discolored sputum.  He took a COVID test that was negative.  Symptoms started on Friday, November 18 but have worsened.  He has a history of hypothyroidism treated with thyroid replacement medication and essential hypertension treated with 3 drug regimen.  He also takes rosuvastatin for hyperlipidemia.   Review of Systems denies shaking chills or myalgias.  No nausea or vomiting.  Cough is productive of discolored sputum.  No distinct wheezing.     Objective:   Physical Exam Temperature 99.1 degrees pulse oximetry 98% on room air pulse 83 regular blood pressure 134/78  Skin: Warm and dry.  TMs not red.  Pharynx very slightly injected without exudate.  Neck is supple.  Chest is clear to auscultation without rales.  No wheezing identified.       Assessment & Plan:   Acute lower respiratory infection  History of hypertension treated by Dr. Acie Fredrickson with doxazosin, diltiazem and HCTZ  Hypothyroidism-stable with levothyroxine 125 mcg daily  Low HDL of 30  Hypertriglyceridemia treated with statin medication  Plan: Minocycline 50 mg twice daily for 10 days.  Take methylprednisolone 4 mg and tapering course as directed starting with 6 tablets day 1 and decreasing by 1 tablet daily i.e. 6-5-4-3-2-1 taper.  Rest and stay well-hydrated.  Monitor pulse oximetry.  Call if symptoms not improving in 48 hours or sooner if worse.

## 2022-11-05 NOTE — Patient Instructions (Signed)
Take tapering course of methylprednisolone 4 mg as directed starting with 6 tablets day 1 and decreasing by 1 tablet daily.  Take minocycline 50 mg twice daily for 10 days.  Rest and monitor pulse oximetry.  Stay well-hydrated.  Call if not better in 48 hours or sooner if worse.

## 2022-12-26 ENCOUNTER — Other Ambulatory Visit: Payer: BC Managed Care – PPO

## 2022-12-26 DIAGNOSIS — E785 Hyperlipidemia, unspecified: Secondary | ICD-10-CM

## 2022-12-26 DIAGNOSIS — Z125 Encounter for screening for malignant neoplasm of prostate: Secondary | ICD-10-CM

## 2022-12-26 DIAGNOSIS — E039 Hypothyroidism, unspecified: Secondary | ICD-10-CM

## 2022-12-26 DIAGNOSIS — I10 Essential (primary) hypertension: Secondary | ICD-10-CM

## 2022-12-27 ENCOUNTER — Encounter: Payer: BC Managed Care – PPO | Admitting: Internal Medicine

## 2023-01-09 ENCOUNTER — Encounter: Payer: Self-pay | Admitting: Internal Medicine

## 2023-01-09 LAB — HM COLONOSCOPY

## 2023-01-21 ENCOUNTER — Other Ambulatory Visit: Payer: Self-pay | Admitting: Internal Medicine

## 2023-02-10 ENCOUNTER — Other Ambulatory Visit: Payer: Self-pay | Admitting: Cardiovascular Disease

## 2023-02-21 ENCOUNTER — Other Ambulatory Visit: Payer: Self-pay | Admitting: Cardiovascular Disease

## 2023-03-26 ENCOUNTER — Other Ambulatory Visit: Payer: Self-pay | Admitting: Cardiovascular Disease

## 2023-03-28 NOTE — Progress Notes (Signed)
Patient Care Team: Margaree Mackintosh, MD as PCP - General (Internal Medicine) Nahser, Deloris Ping, MD as PCP - Cardiology (Cardiology) Janalyn Harder, MD (Inactive) as Consulting Physician (Dermatology)  Visit Date: 04/04/23  Subjective:    Patient ID: Ricky Sharp , Male   DOB: [birth year], 62 y.o.    MRN: [id number]   62 y.o. Male presents today for a comprehensive physical exam. Patient has a past medical history of anxiety, squamous cell carcinoma, sleep apnea, hypertension, eczema, hypothyroidism, recurrent upper respiratory infection.  History of hyperlipidemia treated with resuvastatin 10 mg daily. HDL low at 37, TRIG elevated at 169 on 04/03/23.  History of hypertension treated with hydrochlorothiazide 25 mg daily. Blood pressure normal today at 134/82.  History of anxiety treated with alprazolam 0.25 mg twice daily as needed.  History of hypothyroidism treated with levothyroxine 125 mcg daily. TSH at 1.62.  PSA normal at 0.26. Glucose normal. Kidney, liver function normal. Electrolytes normal. Blood proteins normal. CBC normal.  Colonoscopy last completed 01/09/23. Results showed diverticulosis in sigmoid colon, one small sessile polyp in cecum, removed, small internal hemorrhoids. Examination otherwise normal. Recommended repeat in 2029.   Past Medical History:  Diagnosis Date   Anxiety    Atypical mole 10/28/2004   left upper shin outer cx3 67fu moderate tx w/s   Atypical mole 05/13/2005   right outer lower back no tx sight   Atypical mole 11/27/2008   left low lateral back slight   Atypical mole 11/27/2008   left low medial back slight   Atypical mole 11/27/2008   left thigh sup moderate   Atypical mole 11/27/2008   left thigh inf slight   Atypical mole 10/04/2016   right med back moderate   Eczema    Hypertension    Hypothyroidism    Recurrent upper respiratory infection (URI)    SCC (squamous cell carcinoma) 09/04/2019   left mid shin outer tx cx3 52fu    SCCA (squamous cell carcinoma) of skin 10/03/2019   left calf inf to watch   SCCA (squamous cell carcinoma) of skin 10/03/2019   left calf sup to watch   SCCA (squamous cell carcinoma) of skin 10/03/2019   right inf calf to watch   Sleep apnea    borderline, uses dental device   Squamous cell carcinoma of skin 09/04/2019   left upper shin outer tx cx3 88fu     Family History  Adopted: Yes  Problem Relation Age of Onset   Fibromyalgia Mother    Syncope episode Mother    Lung disease Mother     Social History   Social History Narrative   Not on file      Review of Systems  Constitutional:  Negative for chills, fever, malaise/fatigue and weight loss.  HENT:  Negative for hearing loss, sinus pain and sore throat.   Respiratory:  Negative for cough, hemoptysis and shortness of breath.   Cardiovascular:  Negative for chest pain, palpitations, leg swelling and PND.  Gastrointestinal:  Negative for abdominal pain, constipation, diarrhea, heartburn, nausea and vomiting.  Genitourinary:  Negative for dysuria, frequency and urgency.  Musculoskeletal:  Negative for back pain, myalgias and neck pain.  Skin:  Negative for itching and rash.  Neurological:  Negative for dizziness, tingling, seizures and headaches.  Endo/Heme/Allergies:  Negative for polydipsia.  Psychiatric/Behavioral:  Negative for depression. The patient is not nervous/anxious.         Objective:   Vitals: BP 134/82   Pulse 73  Temp 97.8 F (36.6 C) (Tympanic)   Ht 5' 11.56" (1.818 m)   Wt 211 lb (95.7 kg)   SpO2 98%   BMI 28.97 kg/m    Physical Exam Vitals and nursing note reviewed.  Constitutional:      General: He is awake. He is not in acute distress.    Appearance: Normal appearance. He is not ill-appearing or toxic-appearing.  HENT:     Head: Normocephalic and atraumatic.     Right Ear: Tympanic membrane, ear canal and external ear normal.     Left Ear: Tympanic membrane, ear canal and  external ear normal.     Mouth/Throat:     Pharynx: Oropharynx is clear.  Eyes:     Extraocular Movements: Extraocular movements intact.     Pupils: Pupils are equal, round, and reactive to light.  Neck:     Thyroid: No thyroid mass, thyromegaly or thyroid tenderness.     Vascular: No carotid bruit.  Cardiovascular:     Rate and Rhythm: Normal rate and regular rhythm. No extrasystoles are present.    Pulses:          Dorsalis pedis pulses are 1+ on the right side and 1+ on the left side.     Heart sounds: Normal heart sounds. No murmur heard.    No friction rub. No gallop.  Pulmonary:     Effort: Pulmonary effort is normal.     Breath sounds: Normal breath sounds. No decreased breath sounds, wheezing, rhonchi or rales.  Chest:     Chest wall: No mass.  Abdominal:     Palpations: Abdomen is soft.     Tenderness: There is no abdominal tenderness.     Hernia: No hernia is present.  Genitourinary:    Prostate: Normal. Not enlarged and no nodules present.     Comments: Prostate smooth and symmetrical upon examination. Musculoskeletal:     Cervical back: Normal range of motion.     Right lower leg: No edema.     Left lower leg: No edema.  Lymphadenopathy:     Cervical: No cervical adenopathy.     Upper Body:     Right upper body: No supraclavicular adenopathy.     Left upper body: No supraclavicular adenopathy.  Skin:    General: Skin is warm and dry.  Neurological:     General: No focal deficit present.     Mental Status: He is alert and oriented to person, place, and time. Mental status is at baseline.     Cranial Nerves: Cranial nerves 2-12 are intact.     Sensory: Sensation is intact.     Motor: Motor function is intact.     Coordination: Coordination is intact.     Gait: Gait is intact.     Deep Tendon Reflexes: Reflexes are normal and symmetric.  Psychiatric:        Attention and Perception: Attention normal.        Mood and Affect: Mood normal.        Speech: Speech  normal.        Behavior: Behavior normal. Behavior is cooperative.        Thought Content: Thought content normal.        Cognition and Memory: Cognition and memory normal.        Judgment: Judgment normal.       Results:   Studies obtained and personally reviewed by me:  Colonoscopy last completed 01/09/23. Results showed diverticulosis in sigmoid colon,  one small sessile polyp in cecum, removed, small internal hemorrhoids. Examination otherwise normal. Recommended repeat in 2029.   Labs:       Component Value Date/Time   NA 141 04/03/2023 0955   NA 140 05/10/2022 0741   K 3.7 04/03/2023 0955   CL 103 04/03/2023 0955   CO2 28 04/03/2023 0955   GLUCOSE 95 04/03/2023 0955   BUN 19 04/03/2023 0955   BUN 17 05/10/2022 0741   CREATININE 1.04 04/03/2023 0955   CALCIUM 9.7 04/03/2023 0955   PROT 7.4 04/03/2023 0955   ALBUMIN 4.2 12/24/2021 0755   AST 20 04/03/2023 0955   ALT 33 04/03/2023 0955   ALKPHOS 59 12/24/2021 0755   BILITOT 0.7 04/03/2023 0955   GFRNONAA >60 12/24/2021 0755   GFRNONAA 72 09/04/2020 0924   GFRAA 83 09/04/2020 0924     Lab Results  Component Value Date   WBC 5.4 04/03/2023   HGB 16.4 04/03/2023   HCT 48.4 04/03/2023   MCV 86.6 04/03/2023   PLT 272 04/03/2023    Lab Results  Component Value Date   CHOL 116 04/03/2023   HDL 37 (L) 04/03/2023   LDLCALC 54 04/03/2023   TRIG 169 (H) 04/03/2023   CHOLHDL 3.1 04/03/2023    Lab Results  Component Value Date   HGBA1C 5.4 05/31/2019     Lab Results  Component Value Date   TSH 1.62 04/03/2023     Lab Results  Component Value Date   PSA 0.26 04/03/2023   PSA 0.37 11/22/2021   PSA 0.18 09/04/2020      Assessment & Plan:   Hyperlipidemia: Take rosuvastatin 20 mg daily. HDL low at 37, TRIG elevated at 169 on 04/03/23.  Hypertension: treated with hydrochlorothiazide 25 mg daily. Blood pressure normal today at 134/82.  Anxiety: treated with alprazolam 0.25 mg twice daily as  needed.  Hypothyroidism: treated with levothyroxine 125 mcg daily. TSH at 1.62.  PSA normal at 0.26. Glucose normal. Kidney, liver function normal. Electrolytes normal. Blood proteins normal. CBC normal.  Colonoscopy: last completed 01/09/23. Results showed diverticulosis in sigmoid colon, one small sessile polyp in cecum, removed, small internal hemorrhoids. Examination otherwise normal. Recommended repeat in 2029.  Vaccine counseling: UTD on flu, shingles, RSV, Covid-19 tetanus vaccines. Administered pneumococcal 20 vaccine.  Return in 1 year for health maintenance exam or as needed.  Referral to orthopedist for knee pain    I,Alexander Ruley,acting as a scribe for Margaree Mackintosh, MD.,have documented all relevant documentation on the behalf of Margaree Mackintosh, MD,as directed by  Margaree Mackintosh, MD while in the presence of Margaree Mackintosh, MD.   I, Margaree Mackintosh, MD, have reviewed all documentation for this visit. The documentation on 04/11/23 for the exam, diagnosis, procedures, and orders are all accurate and complete.

## 2023-04-03 ENCOUNTER — Other Ambulatory Visit: Payer: BC Managed Care – PPO

## 2023-04-03 DIAGNOSIS — I10 Essential (primary) hypertension: Secondary | ICD-10-CM

## 2023-04-03 DIAGNOSIS — Z125 Encounter for screening for malignant neoplasm of prostate: Secondary | ICD-10-CM

## 2023-04-03 DIAGNOSIS — Z0000 Encounter for general adult medical examination without abnormal findings: Secondary | ICD-10-CM

## 2023-04-03 DIAGNOSIS — E039 Hypothyroidism, unspecified: Secondary | ICD-10-CM

## 2023-04-04 ENCOUNTER — Ambulatory Visit (INDEPENDENT_AMBULATORY_CARE_PROVIDER_SITE_OTHER): Payer: BC Managed Care – PPO | Admitting: Internal Medicine

## 2023-04-04 VITALS — BP 134/82 | HR 73 | Temp 97.8°F | Ht 71.56 in | Wt 211.0 lb

## 2023-04-04 DIAGNOSIS — Z6828 Body mass index (BMI) 28.0-28.9, adult: Secondary | ICD-10-CM

## 2023-04-04 DIAGNOSIS — M25562 Pain in left knee: Secondary | ICD-10-CM

## 2023-04-04 DIAGNOSIS — Z0000 Encounter for general adult medical examination without abnormal findings: Secondary | ICD-10-CM

## 2023-04-04 DIAGNOSIS — I10 Essential (primary) hypertension: Secondary | ICD-10-CM | POA: Diagnosis not present

## 2023-04-04 DIAGNOSIS — E781 Pure hyperglyceridemia: Secondary | ICD-10-CM | POA: Diagnosis not present

## 2023-04-04 DIAGNOSIS — E786 Lipoprotein deficiency: Secondary | ICD-10-CM

## 2023-04-04 DIAGNOSIS — Z8709 Personal history of other diseases of the respiratory system: Secondary | ICD-10-CM

## 2023-04-04 DIAGNOSIS — Z23 Encounter for immunization: Secondary | ICD-10-CM | POA: Diagnosis not present

## 2023-04-04 DIAGNOSIS — E039 Hypothyroidism, unspecified: Secondary | ICD-10-CM

## 2023-04-04 DIAGNOSIS — F419 Anxiety disorder, unspecified: Secondary | ICD-10-CM

## 2023-04-04 LAB — POCT URINALYSIS DIPSTICK
Bilirubin, UA: NEGATIVE
Blood, UA: NEGATIVE
Glucose, UA: NEGATIVE
Ketones, UA: NEGATIVE
Leukocytes, UA: NEGATIVE
Nitrite, UA: NEGATIVE
Protein, UA: NEGATIVE
Spec Grav, UA: 1.015 (ref 1.010–1.025)
Urobilinogen, UA: 0.2 E.U./dL
pH, UA: 5 (ref 5.0–8.0)

## 2023-04-04 LAB — COMPLETE METABOLIC PANEL WITH GFR
AG Ratio: 1.8 (calc) (ref 1.0–2.5)
ALT: 33 U/L (ref 9–46)
AST: 20 U/L (ref 10–35)
Albumin: 4.8 g/dL (ref 3.6–5.1)
Alkaline phosphatase (APISO): 68 U/L (ref 35–144)
BUN: 19 mg/dL (ref 7–25)
CO2: 28 mmol/L (ref 20–32)
Calcium: 9.7 mg/dL (ref 8.6–10.3)
Chloride: 103 mmol/L (ref 98–110)
Creat: 1.04 mg/dL (ref 0.70–1.35)
Globulin: 2.6 g/dL (calc) (ref 1.9–3.7)
Glucose, Bld: 95 mg/dL (ref 65–99)
Potassium: 3.7 mmol/L (ref 3.5–5.3)
Sodium: 141 mmol/L (ref 135–146)
Total Bilirubin: 0.7 mg/dL (ref 0.2–1.2)
Total Protein: 7.4 g/dL (ref 6.1–8.1)
eGFR: 81 mL/min/{1.73_m2} (ref >=60)

## 2023-04-04 LAB — LIPID PANEL
Cholesterol: 116 mg/dL (ref <200)
HDL: 37 mg/dL — ABNORMAL LOW (ref >=40)
LDL Cholesterol (Calc): 54 mg/dL (calc)
Non-HDL Cholesterol (Calc): 79 mg/dL (calc) (ref <130)
Total CHOL/HDL Ratio: 3.1 (calc) (ref <5.0)
Triglycerides: 169 mg/dL — ABNORMAL HIGH (ref <150)

## 2023-04-04 LAB — CBC WITH DIFFERENTIAL/PLATELET
Absolute Monocytes: 518 cells/uL (ref 200–950)
Basophils Absolute: 38 cells/uL (ref 0–200)
Basophils Relative: 0.7 %
Eosinophils Absolute: 173 cells/uL (ref 15–500)
Eosinophils Relative: 3.2 %
HCT: 48.4 % (ref 38.5–50.0)
Hemoglobin: 16.4 g/dL (ref 13.2–17.1)
Lymphs Abs: 1350 cells/uL (ref 850–3900)
MCH: 29.3 pg (ref 27.0–33.0)
MCHC: 33.9 g/dL (ref 32.0–36.0)
MCV: 86.6 fL (ref 80.0–100.0)
MPV: 10.5 fL (ref 7.5–12.5)
Monocytes Relative: 9.6 %
Neutro Abs: 3321 cells/uL (ref 1500–7800)
Neutrophils Relative %: 61.5 %
Platelets: 272 10*3/uL (ref 140–400)
RBC: 5.59 10*6/uL (ref 4.20–5.80)
RDW: 13.6 % (ref 11.0–15.0)
Total Lymphocyte: 25 %
WBC: 5.4 10*3/uL (ref 3.8–10.8)

## 2023-04-04 LAB — TSH: TSH: 1.62 mIU/L (ref 0.40–4.50)

## 2023-04-04 LAB — PSA: PSA: 0.26 ng/mL (ref <=4.00)

## 2023-04-04 MED ORDER — ROSUVASTATIN CALCIUM 20 MG PO TABS
20.0000 mg | ORAL_TABLET | Freq: Every day | ORAL | 1 refills | Status: DC
Start: 2023-04-04 — End: 2023-10-30

## 2023-04-11 ENCOUNTER — Encounter: Payer: Self-pay | Admitting: Internal Medicine

## 2023-04-11 NOTE — Patient Instructions (Addendum)
It was a pleasure to see you today.  Labs are stable.  Continue current medications and follow-up in 1 year or as needed.  Pneumococcal 20 vaccine given.  Referral to orthopedist for knee pain

## 2023-05-09 ENCOUNTER — Encounter: Payer: Self-pay | Admitting: Orthopaedic Surgery

## 2023-05-09 ENCOUNTER — Other Ambulatory Visit (INDEPENDENT_AMBULATORY_CARE_PROVIDER_SITE_OTHER): Payer: BC Managed Care – PPO

## 2023-05-09 ENCOUNTER — Ambulatory Visit: Payer: BC Managed Care – PPO | Admitting: Orthopaedic Surgery

## 2023-05-09 DIAGNOSIS — M25561 Pain in right knee: Secondary | ICD-10-CM

## 2023-05-09 NOTE — Progress Notes (Signed)
The patient is an active 62 year old gentleman sent from Dr. Malena Edman Baxley his primary care physician to evaluate and treat acute right knee pain.  He said a few weeks ago his knee was very painful and he points to the superior aspect of the patella and just lateral as a source of his pain with no known injury.  He says its gradually eased off and now he has no problems with the knee at all.  He denies any locking catching.  Again he points to the quad tendon area as a source of what his pain was.  He says he tries to stay active but does not really do a whole lot.  He has never had surgery on the knee nor has he had any type of injections in that knee.  I was able to review all of his notes within epic in terms of his past medical history and medication history.  Examination of his right knee shows basically normal exam.  His knee is pain-free.  He has range of motion that is full.  The knee is ligamentously stable with a negative McMurray's and negative Lachman's exam.  There is no pain over the quad tendon.  His extensor mechanism is intact and his extensor strength is 5 out of 5.  2 views of the right knee shows some mild arthritic changes with slight varus malalignment.  There is no acute findings and no effusion.  Likely he did have some type of acute tendinitis of his right knee quad tendon.  He is now pain-free and his exam is normal.  However, if he does develop any acute pain again, he would need to call us directly and we would see and evaluate the knee again.  He agrees with this treatment plan.  All questions and concerns were addressed and answered.

## 2023-05-23 ENCOUNTER — Other Ambulatory Visit: Payer: Self-pay | Admitting: Cardiovascular Disease

## 2023-06-09 ENCOUNTER — Telehealth: Payer: Self-pay | Admitting: Internal Medicine

## 2023-06-09 ENCOUNTER — Telehealth (INDEPENDENT_AMBULATORY_CARE_PROVIDER_SITE_OTHER): Payer: BC Managed Care – PPO | Admitting: Internal Medicine

## 2023-06-09 DIAGNOSIS — Z8709 Personal history of other diseases of the respiratory system: Secondary | ICD-10-CM

## 2023-06-09 DIAGNOSIS — U071 COVID-19: Secondary | ICD-10-CM

## 2023-06-09 MED ORDER — NIRMATRELVIR/RITONAVIR (PAXLOVID)TABLET: 3.0000 | ORAL_TABLET | Freq: Two times a day (BID) | ORAL | 0 refills | Status: AC

## 2023-06-09 NOTE — Progress Notes (Shared)
Patient Care Team: Margaree Mackintosh, MD as PCP - General (Internal Medicine) Nahser, Deloris Ping, MD as PCP - Cardiology (Cardiology) Janalyn Harder, MD (Inactive) as Consulting Physician (Dermatology)  I connected with Jocelyn Lamer on 06/09/23 at 12:00 PM by video enabled telemedicine visit and verified that I am speaking with the correct person using two identifiers.   I discussed the limitations, risks, security and privacy concerns of performing an evaluation and management service by telemedicine and the availability of in-person appointments. I also discussed with the patient that there may be a patient responsible charge related to this service. The patient expressed understanding and agreed to proceed.   Other persons participating in the visit and their role in the encounter: Medical scribe, Doylene Bode  Patient's location: Home  Provider's location: Clinic   I provided 10 minutes of face-to-face video visit time during this encounter, and > 50% was spent counseling as documented under my assessment & plan. He is identified using two identifiers, Ricky Sharp, a patient of this practice. He is in his home and I am in my practice. He is agreeable to using this format today.  Chief Complaint: malaise, fatigue, sore throat.   Subjective:    Patient ID: Ricky Sharp , Male    DOB: 07/25/1961, 62 y.o.    MRN: [id number]   62 y.o. Male presents today for malaise, sore throat, fatigue, fever since night of 06/08/23. Denies discolored sputum production. Positive at-home Covid-19 test. Taking Tylenol.  Past Medical History:  Diagnosis Date   Anxiety    Atypical mole 10/28/2004   left upper shin outer cx3 39fu moderate tx w/s   Atypical mole 05/13/2005   right outer lower back no tx sight   Atypical mole 11/27/2008   left low lateral back slight   Atypical mole 11/27/2008   left low medial back slight   Atypical mole 11/27/2008   left thigh sup moderate   Atypical mole  11/27/2008   left thigh inf slight   Atypical mole 10/04/2016   right med back moderate   Eczema    Hypertension    Hypothyroidism    Recurrent upper respiratory infection (URI)    SCC (squamous cell carcinoma) 09/04/2019   left mid shin outer tx cx3 59fu   SCCA (squamous cell carcinoma) of skin 10/03/2019   left calf inf to watch   SCCA (squamous cell carcinoma) of skin 10/03/2019   left calf sup to watch   SCCA (squamous cell carcinoma) of skin 10/03/2019   right inf calf to watch   Sleep apnea    borderline, uses dental device   Squamous cell carcinoma of skin 09/04/2019   left upper shin outer tx cx3 5fu     Family History  Adopted: Yes  Problem Relation Age of Onset   Fibromyalgia Mother    Syncope episode Mother    Lung disease Mother     Social History   Social History Narrative   Not on file      Review of Systems  Constitutional:  Positive for fever and malaise/fatigue.  HENT:  Positive for sore throat. Negative for congestion.   Eyes:  Negative for blurred vision.  Respiratory:  Negative for cough, sputum production and shortness of breath.   Cardiovascular:  Negative for chest pain, palpitations and leg swelling.  Gastrointestinal:  Negative for vomiting.  Musculoskeletal:  Negative for back pain.  Skin:  Negative for rash.  Neurological:  Negative for loss of  consciousness and headaches.        Objective:   Vitals: There were no vitals taken for this visit.   Physical Exam Vitals and nursing note reviewed.  Constitutional:      General: He is not in acute distress.    Appearance: Normal appearance. He is not ill-appearing.  HENT:     Head: Normocephalic and atraumatic.  Pulmonary:     Effort: Pulmonary effort is normal.  Skin:    General: Skin is warm and dry.  Neurological:     Mental Status: He is alert and oriented to person, place, and time. Mental status is at baseline.  Psychiatric:        Mood and Affect: Mood normal.         Behavior: Behavior normal.        Thought Content: Thought content normal.        Judgment: Judgment normal.       Results:   Studies obtained and personally reviewed by me:   Labs:       Component Value Date/Time   NA 141 04/03/2023 0955   NA 140 05/10/2022 0741   K 3.7 04/03/2023 0955   CL 103 04/03/2023 0955   CO2 28 04/03/2023 0955   GLUCOSE 95 04/03/2023 0955   BUN 19 04/03/2023 0955   BUN 17 05/10/2022 0741   CREATININE 1.04 04/03/2023 0955   CALCIUM 9.7 04/03/2023 0955   PROT 7.4 04/03/2023 0955   ALBUMIN 4.2 12/24/2021 0755   AST 20 04/03/2023 0955   ALT 33 04/03/2023 0955   ALKPHOS 59 12/24/2021 0755   BILITOT 0.7 04/03/2023 0955   GFRNONAA >60 12/24/2021 0755   GFRNONAA 72 09/04/2020 0924   GFRAA 83 09/04/2020 0924     Lab Results  Component Value Date   WBC 5.4 04/03/2023   HGB 16.4 04/03/2023   HCT 48.4 04/03/2023   MCV 86.6 04/03/2023   PLT 272 04/03/2023    Lab Results  Component Value Date   CHOL 116 04/03/2023   HDL 37 (L) 04/03/2023   LDLCALC 54 04/03/2023   TRIG 169 (H) 04/03/2023   CHOLHDL 3.1 04/03/2023    Lab Results  Component Value Date   HGBA1C 5.4 05/31/2019     Lab Results  Component Value Date   TSH 1.62 04/03/2023     Lab Results  Component Value Date   PSA 0.26 04/03/2023   PSA 0.37 11/22/2021   PSA 0.18 09/04/2020      Assessment & Plan:   Covid-19 infection: prescribed Paxlovid regular strength two tabs daily. GFR at 81 on 04/03/23. Drink plenty of water, eat well, walk around regularly. Contact us if symptoms do not improve.    I,Alexander Ruley,acting as a Neurosurgeon for Margaree Mackintosh, MD.,have documented all relevant documentation on the behalf of Margaree Mackintosh, MD,as directed by  Margaree Mackintosh, MD while in the presence of Margaree Mackintosh, MD.   ***

## 2023-06-09 NOTE — Telephone Encounter (Signed)
Patient called and said that he has a 100 degree fever, chills, congestion, and aches. He tested positive for covid as well. Would you like to do a video visit this afternoon or at 12?

## 2023-06-09 NOTE — Telephone Encounter (Signed)
Patient scheduled.

## 2023-06-10 ENCOUNTER — Encounter: Payer: Self-pay | Admitting: Internal Medicine

## 2023-06-10 NOTE — Patient Instructions (Signed)
We are sorry you are not feeling well.  We have prescribed regular strength Paxlovid for you as your kidney functions are normal.  Monitor pulse oximetry.  Walk some to prevent atelectasis.  Try to stay well-hydrated.  Call if you have any questions or concerns.  Need to quarantine at home for 5 days.

## 2023-06-20 ENCOUNTER — Ambulatory Visit
Admission: RE | Admit: 2023-06-20 | Discharge: 2023-06-20 | Disposition: A | Discharge status: Home / Self Care | Payer: BC Managed Care – PPO | Source: Ambulatory Visit | Attending: Internal Medicine | Admitting: Internal Medicine

## 2023-06-20 ENCOUNTER — Telehealth (INDEPENDENT_AMBULATORY_CARE_PROVIDER_SITE_OTHER): Payer: BC Managed Care – PPO | Admitting: Internal Medicine

## 2023-06-20 ENCOUNTER — Telehealth: Payer: Self-pay

## 2023-06-20 DIAGNOSIS — J22 Unspecified acute lower respiratory infection: Secondary | ICD-10-CM | POA: Diagnosis not present

## 2023-06-20 DIAGNOSIS — R059 Cough, unspecified: Secondary | ICD-10-CM

## 2023-06-20 DIAGNOSIS — U071 COVID-19: Secondary | ICD-10-CM

## 2023-06-20 DIAGNOSIS — Z8709 Personal history of other diseases of the respiratory system: Secondary | ICD-10-CM

## 2023-06-20 DIAGNOSIS — I10 Essential (primary) hypertension: Secondary | ICD-10-CM

## 2023-06-20 MED ORDER — METHYLPREDNISOLONE 4 MG PO TABS: ORAL_TABLET | ORAL | 0 refills | Status: DC

## 2023-06-20 MED ORDER — HYDROCODONE BIT-HOMATROP MBR 5-1.5 MG/5ML PO SOLN: 5.0000 mL | Freq: Three times a day (TID) | ORAL | 0 refills | Status: DC | PRN

## 2023-06-20 MED ORDER — DOXYCYCLINE HYCLATE 100 MG PO TABS: 100.0000 mg | ORAL_TABLET | Freq: Two times a day (BID) | ORAL | 0 refills | Status: DC

## 2023-06-20 NOTE — Telephone Encounter (Signed)
Patient called to say that he is still testing Covid positive and has a fever of 100, chills, still achy and has green mucous. Wants to know what to do please advise.

## 2023-06-20 NOTE — Telephone Encounter (Signed)
Schedule video visit at 12:30 and told him to go ahead a go get chest xray, he said it would take him about 20 mintues

## 2023-06-20 NOTE — Telephone Encounter (Signed)
X-ray ordered.

## 2023-06-20 NOTE — Progress Notes (Signed)
Patient Care Team: Margaree Mackintosh, MD as PCP - General (Internal Medicine) Nahser, Deloris Ping, MD as PCP - Cardiology (Cardiology) Janalyn Harder, MD (Inactive) as Consulting Physician (Dermatology)  I connected with Jocelyn Lamer on 06/20/23 at 12:50 PM by video enabled telemedicine visit and verified that I am speaking with the correct person using two identifiers.   I discussed the limitations, risks, security and privacy concerns of performing an evaluation and management service by telemedicine and the availability of in-person appointments. I also discussed with the patient that there may be a patient responsible charge related to this service. The patient expressed understanding and agreed to proceed.   Other persons participating in the visit and their role in the encounter: Medical scribe, Doylene Bode  Patient's location: Home  Provider's location: Clinic   I provided 10 minutes of face-to-face video visit time during this encounter, and > 50% was spent counseling as documented under my assessment & plan. He is identified using two identifiers, Ricky Sharp, a patient of this practice. He is in his home and I am in my practice. He is agreeable to using this format today.  Chief Complaint: Cough, fever   Subjective:    Patient ID: Ricky Sharp , Male    DOB: 03-04-1961, 62 y.o.    MRN: [id number]   62 y.o. Male presents today for continuing cough, fever, congestion, malaise since 06/08/23. Had chills this morning but resolved. Denies shortness of breath. Was treated here for Covid-19 with Paxlovid regular strength. Finished Paxlovid and still having symptoms. CXR today showed no active cardiopulmonary disease.  Past Medical History:  Diagnosis Date   Anxiety    Atypical mole 10/28/2004   left upper shin outer cx3 62fu moderate tx w/s   Atypical mole 05/13/2005   right outer lower back no tx sight   Atypical mole 11/27/2008   left low lateral back slight    Atypical mole 11/27/2008   left low medial back slight   Atypical mole 11/27/2008   left thigh sup moderate   Atypical mole 11/27/2008   left thigh inf slight   Atypical mole 10/04/2016   right med back moderate   Eczema    Hypertension    Hypothyroidism    Recurrent upper respiratory infection (URI)    SCC (squamous cell carcinoma) 09/04/2019   left mid shin outer tx cx3 38fu   SCCA (squamous cell carcinoma) of skin 10/03/2019   left calf inf to watch   SCCA (squamous cell carcinoma) of skin 10/03/2019   left calf sup to watch   SCCA (squamous cell carcinoma) of skin 10/03/2019   right inf calf to watch   Sleep apnea    borderline, uses dental device   Squamous cell carcinoma of skin 09/04/2019   left upper shin outer tx cx3 67fu     Family History  Adopted: Yes  Problem Relation Age of Onset   Fibromyalgia Mother    Syncope episode Mother    Lung disease Mother     Social history: Divorced.  Non-smoker.  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     Review of Systems  Constitutional:  Positive for fever and malaise/fatigue. Negative for chills.  HENT:  Positive for congestion.   Eyes:  Negative for blurred vision.  Respiratory:  Positive for cough. Negative for shortness of breath.   Cardiovascular:  Negative for chest pain, palpitations and leg swelling.  Gastrointestinal:  Negative for vomiting.  Musculoskeletal:  Negative  for back pain.  Skin:  Negative for rash.  Neurological:  Negative for loss of consciousness and headaches.        Objective:   Vitals:    Physical Exam Vitals and nursing note reviewed.  Constitutional:      General: He is not in acute distress.    Appearance: Normal appearance. He is not ill-appearing.  HENT:     Head: Normocephalic and atraumatic.  Pulmonary:     Effort: Pulmonary effort is normal.  Skin:    General: Skin is warm and dry.  Neurological:     Mental Status: He is alert and oriented to person, place, and time.  Mental status is at baseline.  Psychiatric:        Mood and Affect: Mood normal.        Behavior: Behavior normal.        Thought Content: Thought content normal.        Judgment: Judgment normal.       Results:   Studies obtained and personally reviewed by me:  CXR today showed no active cardiopulmonary disease.  Labs:       Component Value Date/Time   NA 141 04/03/2023 0955   NA 140 05/10/2022 0741   K 3.7 04/03/2023 0955   CL 103 04/03/2023 0955   CO2 28 04/03/2023 0955   GLUCOSE 95 04/03/2023 0955   BUN 19 04/03/2023 0955   BUN 17 05/10/2022 0741   CREATININE 1.04 04/03/2023 0955   CALCIUM 9.7 04/03/2023 0955   PROT 7.4 04/03/2023 0955   ALBUMIN 4.2 12/24/2021 0755   AST 20 04/03/2023 0955   ALT 33 04/03/2023 0955   ALKPHOS 59 12/24/2021 0755   BILITOT 0.7 04/03/2023 0955   GFRNONAA >60 12/24/2021 0755   GFRNONAA 72 09/04/2020 0924   GFRAA 83 09/04/2020 0924     Lab Results  Component Value Date   WBC 5.4 04/03/2023   HGB 16.4 04/03/2023   HCT 48.4 04/03/2023   MCV 86.6 04/03/2023   PLT 272 04/03/2023    Lab Results  Component Value Date   CHOL 116 04/03/2023   HDL 37 (L) 04/03/2023   LDLCALC 54 04/03/2023   TRIG 169 (H) 04/03/2023   CHOLHDL 3.1 04/03/2023    Lab Results  Component Value Date   HGBA1C 5.4 05/31/2019     Lab Results  Component Value Date   TSH 1.62 04/03/2023     Lab Results  Component Value Date   PSA 0.26 04/03/2023   PSA 0.37 11/22/2021   PSA 0.18 09/04/2020      Assessment & Plan:   Acute lower respiratory infection: Today prescribed doxycycline 100 mg twice daily for 10 days.  Also prescribed Hycodan syrup 1 tsp every 8 hours as needed for cough, prescribed Medrol dosepak tapering course take as directed. Contact us if symptoms do not improve.  Stay well-hydrated and walk some to prevent atelectasis.  Monitor pulse oximetry.  Call if not improving within 48 hours or sooner if worse.  COVID-19 virus infection  diagnosed June 28.  He was  treated with Paxlovid and had video visit at that time but continued to have persistent cough and congestion  Chest x-ray today is negative for pneumonia  Essential hypertension stable on treatment  I,Alexander Ruley,acting as a scribe for Margaree Mackintosh, MD.,have documented all relevant documentation on the behalf of Margaree Mackintosh, MD,as directed by  Margaree Mackintosh, MD while in the presence of Margaree Mackintosh,  MD.   Iverson Alamin, MD, have reviewed all documentation for this visit. The documentation on 07/08/23 for the exam, diagnosis, procedures, and orders are all accurate and complete.

## 2023-07-04 ENCOUNTER — Other Ambulatory Visit: Payer: BC Managed Care – PPO

## 2023-07-04 DIAGNOSIS — E781 Pure hyperglyceridemia: Secondary | ICD-10-CM

## 2023-07-08 ENCOUNTER — Encounter: Payer: Self-pay | Admitting: Internal Medicine

## 2023-07-08 NOTE — Patient Instructions (Addendum)
I believe you have a persistent lower respiratory infection, i.e. bronchitis secondary to acute COVID-19 that was diagnosed in late June.  Chest x-ray is negative for pneumonia which is fortunate.  We will prescribe doxycycline 100 mg twice daily for 10 days.  Also prescribed Hycodan 1 teaspoon every 8 hours as needed for cough.  Prescribed Medrol Dosepak 4 mg tablets to take in tapering course as directed starting with 6 tablets day 1 and decreasing by 1 tablet daily.  Stay well-hydrated and walk some to prevent atelectasis.  Monitor pulse oximetry.  Call if not improving within 48 hours or sooner if worse.

## 2023-07-11 ENCOUNTER — Other Ambulatory Visit: Payer: BC Managed Care – PPO

## 2023-07-11 DIAGNOSIS — E781 Pure hyperglyceridemia: Secondary | ICD-10-CM

## 2023-07-23 ENCOUNTER — Other Ambulatory Visit: Payer: Self-pay | Admitting: Internal Medicine

## 2023-07-28 ENCOUNTER — Encounter: Payer: Self-pay | Admitting: Cardiovascular Disease

## 2023-07-28 ENCOUNTER — Ambulatory Visit: Payer: BC Managed Care – PPO | Attending: Cardiovascular Disease | Admitting: Cardiovascular Disease

## 2023-07-28 VITALS — BP 138/88 | HR 69 | Ht 70.5 in | Wt 205.8 lb

## 2023-07-28 DIAGNOSIS — E782 Mixed hyperlipidemia: Secondary | ICD-10-CM | POA: Diagnosis not present

## 2023-07-28 DIAGNOSIS — Z79899 Other long term (current) drug therapy: Secondary | ICD-10-CM

## 2023-07-28 DIAGNOSIS — I10 Essential (primary) hypertension: Secondary | ICD-10-CM

## 2023-07-28 MED ORDER — SPIRONOLACTONE 25 MG PO TABS
25.0000 mg | ORAL_TABLET | Freq: Every day | ORAL | 3 refills | Status: DC
Start: 2023-07-28 — End: 2024-07-25

## 2023-07-28 NOTE — Patient Instructions (Signed)
Medication Instructions:  STOP Hydrochlorothiazide STOP Potassium Chloride START Spironolactone 25mg  daily *If you need a refill on your cardiac medications before your next appointment, please call your pharmacy*   Lab Work: BMET in 1 week If you have labs (blood work) drawn today and your tests are completely normal, you will receive your results only by: MyChart Message (if you have MyChart) OR A paper copy in the mail If you have any lab test that is abnormal or we need to change your treatment, we will call you to review the results.   Testing/Procedures: NONE   Follow-Up: At [facility], you and your health needs are our priority.  As part of our continuing mission to provide you with exceptional heart care, we have created designated Provider Care Teams.  These Care Teams include your primary Cardiologist (physician) and Advanced Practice Providers (APPs -  Physician Assistants and Nurse Practitioners) who all work together to provide you with the care you need, when you need it.  We recommend signing up for the patient portal called "MyChart".  Sign up information is provided on this After Visit Summary.  MyChart is used to connect with patients for Virtual Visits (Telemedicine).  Patients are able to view lab/test results, encounter notes, upcoming appointments, etc.  Non-urgent messages can be sent to your provider as well.   To learn more about what you can do with MyChart, go to [email or web address].    Your next appointment:   1 year(s)  Provider:   Kristeen Miss, MD

## 2023-07-28 NOTE — Progress Notes (Signed)
Cardiology Office Note   Date:  07/28/2023   ID:  Ricky Sharp, DOB 1961/04/07, MRN [id number]  PCP:  Margaree Mackintosh, MD  Cardiologist:   Kristeen Miss, MD  No chief complaint on file.     previoius notes.  [name] is a 62 y.o. male who presents for HTN  1. Hypertension 2. Anxiety - stress related 3. GERD    Patient is a 62 year old gentleman with a history of hypertension. He's tried several medications in the past that have not completely controlled his blood pressure.                - He has tried Losartan but had some dizziness / dry mouth.                - tried bystolic 5 mg  - felt poorly, persistant head ache, diarrhea, mild respiratory distress.  Tried for a week              He has also tried some antianxiety meds but had various reactions.    He has multiple family and life stresses.    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  He is also having some family health issues that require him to spend lots of time with them.  He has felt well on his HCTZ and potassium since his last visit.  He has not been exercising as much as he would like.  Oct. 1, 2014:   Ricky Sharp is a 62 yo with hx of HTN.  He has not been exercising.  He has gained 10 lbs.  He has tried Losartan but it caused vertigo.  He tried beta blockers but had severe fatigue and slow HR.   He does not want to start any new meds.  He knows that his weight is up and that is BP has been elevated.   March 04, 2014:  [name] has been diagnosed with hypothyroidism since I last saw him.   He is feeling somewhat better.     Diastolic BP has still been a bit high.  Still eating some salt.   BP at home is typically is the  80s - occasionally upper 80s.    Oct. 1, 2015:  Ricky Sharp is now off the Losartan - was having lots of fatigue and diarrhea.   Symptoms are better.  On HCTZ and blood pressures are well controlled. Rides his bike  on occasion. He also  is taking Judo . Not  Having any episodes of chest pain BP has been ok  Jan. 21, 2016:  Ricky Sharp has had HTN that has been difficult to manage.  He developed GI complaints with Losartan and with Lisinopril.  He had significant diarrhea on the Lisinopril - has resolved.   April 03, 2015:  Ricky Sharp has been seen in the hypertension clinic for the past several months. His blood pressure is now well controlled. No CP , no dyspnea. Still has anxiety.   April 04, 2016: Ricky Sharp is doing well  Weight is up a little  Mother died , lots of stress.  Amlodipine still causes diarrhea - ( like clockwork)   May 18, 2017  BP has been well controled at home.   Still eating a little more salt than he should. Not as active.   Weight is staying elevated.  Mother died 2 years ago and he had a struggle working through the estate.  No exercising   Jan. 8, 2018:  Ricky Sharp is seen today for follow up of his HTN. Exercising some.  Hoping to get out more . 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       December 20, 2017:  Ricky Sharp, seen today for further evaluation and management of his hypertension. No CP or dyspnea . Eats some salt,  Not much  Does not exercise  - HDL is 28 Had a reaction to Losartan and Lisinopril   March 20, 2020 Ricky Sharp is see back for follow up of his HTN and HLD Has had both covid vaccines.  Has gained a little weight.   Moderately active.  Has lack of energy . Stressed about his fathers death .  Was found to have a cerebral aneurism. - incidentally noted on a head scan   March 22, 2021 Ricky Sharp is seen back for follow up of his HTN and HLD  Under lots of stress,  Is separated.  Under lots of stress  Not exercising,  Not eating right ,  No Cp , no dyspnea  BP is mildly elevated because of his inactivity and less than ideal diet  Feb. 27 2023 Ricky Sharp is seen back today for follow up of his HTN and HLD  BP is a little elevated.  BP at home looks ok  A  coronary calcium score: Coronary calcium score of 422. This was 85 percentile for age-, race-, and sex-matched controls.  Has had a a stress test in the past - was negative  Is getting some exercise,  no CP with exercise   July 28, 2023: Actually seen back today for follow-up of his hypertension and hyperlipidemia. Wt is 205   His CAC is 422.  On rosuvastatin 20 LDL is 40  Has lost some weight  We is 205       Wt Readings from Last 3 Encounters:  07/28/23 205 lb 12.8 oz (93.4 kg)  04/04/23 211 lb (95.7 kg)  07/26/22 207 lb 12.8 oz (94.3 kg)     Past Medical History:  Diagnosis Date   Anxiety    Atypical mole 10/28/2004   left upper shin outer cx3 91fu moderate tx w/s   Atypical mole 05/13/2005   right outer lower back no tx sight   Atypical mole 11/27/2008   left low lateral back slight   Atypical mole 11/27/2008   left low medial back slight   Atypical mole 11/27/2008   left thigh sup moderate   Atypical mole 11/27/2008   left thigh inf slight   Atypical mole 10/04/2016   right med back moderate   Eczema    Hypertension    Hypothyroidism    Recurrent upper respiratory infection (URI)    SCC (squamous cell carcinoma) 09/04/2019   left mid shin outer tx cx3 55fu   SCCA (squamous cell carcinoma) of skin 10/03/2019   left calf inf to watch   SCCA (squamous cell carcinoma) of skin 10/03/2019   left calf sup to watch   SCCA (squamous cell carcinoma) of skin 10/03/2019   right inf calf to watch   Sleep apnea    borderline, uses dental device   Squamous cell carcinoma of skin 09/04/2019   left upper shin outer tx cx3 69fu    Past Surgical History:  Procedure Laterality Date   CHOLECYSTECTOMY     OSTEOCHONDROMA EXCISION     TONSILLECTOMY     TRIGGER FINGER RELEASE Right  11/26/2019   Procedure: RELEASE TRIGGER FINGER/A-1 PULLEY RIGHT MIDDLE FINGER;  Surgeon: Cindee Salt, MD;  Location: [facility];  Service: Orthopedics;  Laterality: Right;   IV REGIONAL FOREARM BLOCK   WISDOM TOOTH EXTRACTION       Current Outpatient Medications  Medication Sig Dispense Refill   albuterol (VENTOLIN HFA) 108 (90 Base) MCG/ACT inhaler Inhale 2 puffs into the lungs every 6 (six) hours as needed for wheezing or shortness of breath. 8 g 2   ALPRAZolam (XANAX) 0.25 MG tablet TAKE 1 TABLET TWICE DAILY AS NEEDED FOR ANXIETY. 60 tablet 2   diltiazem (DILACOR XR) 240 MG 24 hr capsule TAKE ONE CAPSULE DAILY 90 capsule 2   doxazosin (CARDURA) 2 MG tablet TAKE ONE TABLET BY MOUTH AT BEDTIME 90 tablet 0   doxycycline (VIBRA-TABS) 100 MG tablet Take 1 tablet (100 mg total) by mouth 2 (two) times daily. 20 tablet 0   hydrochlorothiazide (HYDRODIURIL) 25 MG tablet TAKE ONE TABLET BY MOUTH DAILY. 90 tablet 1   HYDROcodone bit-homatropine (HYCODAN) 5-1.5 MG/5ML syrup Take 5 mLs by mouth every 8 (eight) hours as needed for cough. 120 mL 0   Ivermectin 1 % CREA Apply to affected area qd 45 g 3   levothyroxine (SYNTHROID) 125 MCG tablet Take 1 tablet (125 mcg total) by mouth daily. 90 tablet 1   methylPREDNISolone (MEDROL) 4 MG tablet Take in tapering course as directed 6-5-4-3-2-1 21 tablet 0   minocycline (MINOCIN) 50 MG capsule Take 1 capsule (50 mg total) by mouth 2 (two) times daily. (Patient taking differently: Take 50 mg by mouth as needed.) 20 capsule 1   potassium chloride (KLOR-CON) 10 MEQ tablet Take 2 tablets (20 mEq total) by mouth 2 (two) times daily. 360 tablet 1   rosuvastatin (CRESTOR) 20 MG tablet Take 1 tablet (20 mg total) by mouth daily. 90 tablet 1   No current facility-administered medications for this visit.    Allergies:   Lisinopril and Losartan    Social History:  The patient  reports that he has never smoked. He has never used smokeless tobacco. He reports current alcohol use of about 2.0 standard drinks of alcohol per week. He reports that he does not use drugs.   Family History:  The patient's family history includes Fibromyalgia in  his mother; Lung disease in his mother; Syncope episode in his mother. He was adopted.   ROS:   Discussed in H&P.  Otherwise negative    Physical Exam: Blood pressure 138/88, pulse 69, height 5' 10.5" (1.791 m), weight 205 lb 12.8 oz (93.4 kg), SpO2 97%.       GEN:  Well nourished, well developed in no acute distress HEENT: Normal NECK: No JVD; No carotid bruits LYMPHATICS: No lymphadenopathy CARDIAC: RRR , no murmurs, rubs, gallops RESPIRATORY:  Clear to auscultation without rales, wheezing or rhonchi  ABDOMEN: Soft, non-tender, non-distended MUSCULOSKELETAL:  No edema; No deformity  SKIN: Warm and dry NEUROLOGIC:  Alert and oriented x 3     EKG:    EKG Interpretation Date/Time:  Friday July 28 2023 16:23:13 EDT Ventricular Rate:  69 PR Interval:  228 QRS Duration:  96 QT Interval:  394 QTC Calculation: 422 R Axis:   15  Text Interpretation: Sinus rhythm with 1st degree A-V block When compared with ECG of 22-Nov-2019 08:01, No significant change was found Confirmed by Kristeen Miss [phone]) on 07/28/2023 4:43:17 PM     Recent Labs: 04/03/2023: ALT 33; BUN 19; Creat 1.04;  Hemoglobin 16.4; Platelets 272; Potassium 3.7; Sodium 141; TSH 1.62    Lipid Panel    Component Value Date/Time   CHOL 101 07/11/2023 0945   CHOL 111 05/10/2022 0741   TRIG 144 07/11/2023 0945   HDL 38 (L) 07/11/2023 0945   HDL 30 (L) 05/10/2022 0741   CHOLHDL 2.7 07/11/2023 0945   VLDL 61 (H) 12/13/2016 1135   LDLCALC 40 07/11/2023 0945      Wt Readings from Last 3 Encounters:  07/28/23 205 lb 12.8 oz (93.4 kg)  04/04/23 211 lb (95.7 kg)  07/26/22 207 lb 12.8 oz (94.3 kg)      Other studies Reviewed: Additional studies/ records that were reviewed today include: . Review of the above records demonstrates:    ASSESSMENT AND PLAN:  1.  Essential  Hypertension: mildly elevated here.  His potassium levels are frequently low.  I think he might do better on spironolactone instead of  hydrochlorothiazide and potassium supplement.  Will discontinue HCTZ and potassium.  Started on spironolactone 25 mg a day.  Will check a basic metabolic profile in 1 week.   2.  Hyperlipidemia :    His last LDL is 74.  He has a coronary calcium score of 422.  His last LDL is 40.  Continue current medications.     3.  Cerebral aneurism:  Incidentally discovered.      Current medicines are reviewed at length with the patient today.  The patient does not have concerns regarding medicines.  The following changes have been made:      Labs/ tests ordered today include:   Orders Placed This Encounter  Procedures   EKG 12-Lead    Disposition:       Signed, Kristeen Miss, MD  07/28/2023 4:52 PM    [facility] [address] [place], [place], [place]  [address] Phone: [phone]; Fax: [phone]

## 2023-08-04 ENCOUNTER — Ambulatory Visit: Payer: BC Managed Care – PPO | Attending: Cardiovascular Disease

## 2023-08-04 DIAGNOSIS — I10 Essential (primary) hypertension: Secondary | ICD-10-CM

## 2023-08-04 DIAGNOSIS — E782 Mixed hyperlipidemia: Secondary | ICD-10-CM

## 2023-08-04 DIAGNOSIS — Z79899 Other long term (current) drug therapy: Secondary | ICD-10-CM

## 2023-08-05 LAB — BASIC METABOLIC PANEL
BUN/Creatinine Ratio: 17 (ref 10–24)
BUN: 19 mg/dL (ref 8–27)
CO2: 24 mmol/L (ref 20–29)
Calcium: 9.8 mg/dL (ref 8.6–10.2)
Chloride: 104 mmol/L (ref 96–106)
Creatinine, Ser: 1.13 mg/dL (ref 0.76–1.27)
Glucose: 96 mg/dL (ref 70–99)
Potassium: 4.4 mmol/L (ref 3.5–5.2)
Sodium: 141 mmol/L (ref 134–144)
eGFR: 73 mL/min/{1.73_m2} (ref >59)

## 2023-08-21 ENCOUNTER — Other Ambulatory Visit: Payer: Self-pay | Admitting: Cardiovascular Disease

## 2023-08-28 ENCOUNTER — Encounter: Payer: Self-pay | Admitting: Internal Medicine

## 2023-08-28 ENCOUNTER — Ambulatory Visit (INDEPENDENT_AMBULATORY_CARE_PROVIDER_SITE_OTHER): Payer: BC Managed Care – PPO | Admitting: Internal Medicine

## 2023-08-28 VITALS — BP 120/70 | HR 62

## 2023-08-28 DIAGNOSIS — Z23 Encounter for immunization: Secondary | ICD-10-CM | POA: Diagnosis not present

## 2023-08-28 NOTE — Progress Notes (Signed)
Patient received a flu vaccine left deltoid. Patient tolerated well.    IMargaree Mackintosh, MD, have reviewed all documentation for this visit. The documentation on 08/28/23 for the exam, diagnosis, procedures, and orders are all accurate and complete.

## 2023-10-30 ENCOUNTER — Other Ambulatory Visit: Payer: Self-pay | Admitting: Internal Medicine

## 2023-10-31 ENCOUNTER — Encounter: Payer: Self-pay | Admitting: Internal Medicine

## 2023-10-31 MED ORDER — ALPRAZOLAM 0.25 MG PO TABS: 0.2500 mg | ORAL_TABLET | Freq: Two times a day (BID) | ORAL | 2 refills | Status: AC | PRN

## 2024-01-13 ENCOUNTER — Other Ambulatory Visit: Payer: Self-pay | Admitting: Internal Medicine

## 2024-01-20 ENCOUNTER — Other Ambulatory Visit: Payer: Self-pay | Admitting: Cardiovascular Disease

## 2024-04-29 ENCOUNTER — Other Ambulatory Visit: Payer: Self-pay | Admitting: Internal Medicine

## 2024-06-21 ENCOUNTER — Ambulatory Visit: Payer: Self-pay

## 2024-06-21 ENCOUNTER — Encounter: Payer: Self-pay | Admitting: Internal Medicine

## 2024-06-21 ENCOUNTER — Telehealth (INDEPENDENT_AMBULATORY_CARE_PROVIDER_SITE_OTHER): Admitting: Internal Medicine

## 2024-06-21 VITALS — Temp 100.4°F | Ht 70.0 in | Wt 205.0 lb

## 2024-06-21 DIAGNOSIS — U071 COVID-19: Secondary | ICD-10-CM | POA: Diagnosis not present

## 2024-06-21 MED ORDER — HYDROCODONE BIT-HOMATROP MBR 5-1.5 MG/5ML PO SOLN: 5.0000 mL | Freq: Three times a day (TID) | ORAL | 0 refills | Status: AC | PRN

## 2024-06-21 MED ORDER — DOXYCYCLINE HYCLATE 100 MG PO TABS: 100.0000 mg | ORAL_TABLET | Freq: Two times a day (BID) | ORAL | 0 refills | Status: AC

## 2024-06-21 NOTE — Telephone Encounter (Signed)
 FYI Only or Action Required?: Action required by provider: clinical question for provider.  Patient was last seen in primary care on 08/28/2023 by Perri Ronal PARAS, MD.  Called Nurse Triage reporting Covid Positive.  Symptoms began several days ago.  Interventions attempted: Rest, hydration, or home remedies.  Symptoms are: stable.  Triage Disposition: Call PCP Within 24 Hours  Patient/caregiver understands and will follow disposition?: Yes  Spoke with Delon, CAL was advised she will speak with Dr. Perri and see what she prefers pt to do. Will call pt back. Advised pt of this and he appreciated getting care. CB # is correct.   Copied from CRM [id number]. Topic: Clinical - Red Word Triage >> Jun 21, 2024 10:08 AM Robinson H wrote: [facility] that prompted transfer to Nurse Triage: Positive for covid, cough fever 100, aches, pains and congestion Reason for Disposition  [1] HIGH RISK patient (e.g., weak immune system, age > 64 years, obesity with BMI 30 or higher, pregnant, chronic lung disease or other chronic medical condition) AND [2] COVID symptoms (e.g., cough, fever)  (Exceptions: Already seen by PCP and no new or worsening symptoms.)  Answer Assessment - Initial Assessment Questions 1. COVID-19 DIAGNOSIS: How do you know that you have COVID? (e.g., positive lab test or self-test, diagnosed by doctor or NP/PA, symptoms after exposure).     Home test  3. ONSET: When did the COVID-19 symptoms start?      Tuesday  5. COUGH: Do you have a cough? If Yes, ask: How bad is the cough?       yes 6. FEVER: Do you have a fever? If Yes, ask: What is your temperature, how was it measured, and when did it start?     Yes 100.4 7. RESPIRATORY STATUS: Describe your breathing? (e.g., normal; shortness of breath, wheezing, unable to speak)      no 8. BETTER-SAME-WORSE: Are you getting better, staying the same or getting worse compared to yesterday?  If getting worse, ask, In what  way?     Worse  9. OTHER SYMPTOMS: Do you have any other symptoms?  (e.g., chills, fatigue, headache, loss of smell or taste, muscle pain, sore throat)     Scratchy/ sore throat, body aches, congestion 10. HIGH RISK DISEASE: Do you have any chronic medical problems? (e.g., asthma, heart or lung disease, weak immune system, obesity, etc.)       no  Protocols used: Coronavirus (COVID-19) Diagnosed or Suspected-A-AH

## 2024-06-21 NOTE — Progress Notes (Signed)
 Patient Care Team: Perri Ronal PARAS, MD as PCP - General (Internal Medicine) Nahser, Aleene PARAS, MD as PCP - Cardiology (Cardiology) Livingston Rigg, MD (Inactive) as Consulting Physician (Dermatology)  I connected with Ricky Sharp on 06/21/24 at 12:36 PM by video enabled telemedicine visit and verified that I am speaking with the correct person using two identifiers, myself and Ricky Sharp, CMA. I am in my office and patient is in their home.    I discussed the limitations, risks, security and privacy concerns of performing an evaluation and management service by telemedicine and the availability of in-person appointments. I also discussed with the patient that there may be a patient responsible charge related to this service. The patient expressed understanding and agreed to proceed.   Other persons participating in the visit and their role in the encounter: Medical scribe, Damien Blanks  Patient's location: Home  Provider's location: Clinic   I provided 10 minutes of time spent during this encounter, including chart review, interviewing patient, medical decision making, and e-scribing medication with > 50% was spent counseling as documented under my assessment & plan.   Chief Complaint  Patient presents with   Covid Positive    Tested positive last night. Patient c/o cough chills, congestion, body aches, fever 100.4 this morning. He would like to use [employer].    Subjective:  Patient PI:Ricky Sharp,Male DOB:1961-05-01,63 y.o. MRN:[id number]   63 y.o. Male presents today for acute sick visit with Covid-19. Patient has a past medical history of Recurrent URI; Sleep Apnea. As mentioned above, he tested positive for Covid-19 last night and has had symptoms of initially a sore throat starting Tuesday, and progressed further on Wednesday with a non-productive cough, fever/chills, congestion, body aches, sneezing, and rhinorrhea. Tuesday, he was seen at his Dermatologist  reportedly, and has had contact with coworkers, but has not recently traveled. He says that he has still been trying to remain active, has noticed some weight loss.   Past Medical History:  Diagnosis Date   Anxiety    Atypical mole 10/28/2004   left upper shin outer cx3 36fu moderate tx w/s   Atypical mole 05/13/2005   right outer lower back no tx sight   Atypical mole 11/27/2008   left low lateral back slight   Atypical mole 11/27/2008   left low medial back slight   Atypical mole 11/27/2008   left thigh sup moderate   Atypical mole 11/27/2008   left thigh inf slight   Atypical mole 10/04/2016   right med back moderate   Eczema    Hypertension    Hypothyroidism    Recurrent upper respiratory infection (URI)    SCC (squamous cell carcinoma) 09/04/2019   left mid shin outer tx cx3 97fu   SCCA (squamous cell carcinoma) of skin 10/03/2019   left calf inf to watch   SCCA (squamous cell carcinoma) of skin 10/03/2019   left calf sup to watch   SCCA (squamous cell carcinoma) of skin 10/03/2019   right inf calf to watch   Sleep apnea    borderline, uses dental device   Squamous cell carcinoma of skin 09/04/2019   left upper shin outer tx cx3 22fu    Allergies  Allergen Reactions   Lisinopril      diarrhea   Losartan      Fatigue, diarrhea   Family History  Adopted: Yes  Problem Relation Age of Onset   Fibromyalgia Mother    Syncope episode Mother  Lung disease Mother    Social History   Social History Narrative   Not on file   Review of Systems  Constitutional:  Positive for chills, fever (100.4) and malaise/fatigue.  HENT:  Positive for congestion and sore throat.        (+) Rhinorrhea  Eyes:        (+) Pressure Behind Eyes  Respiratory:  Positive for cough. Negative for sputum production and shortness of breath.   Cardiovascular: Negative.   Musculoskeletal:  Positive for myalgias.  Neurological:  Negative for headaches.     Objective:  Vitals provided via  pt: Temp (!) 100.4 F (38 C)   Ht 5' 10 (1.778 m)   Wt 205 lb (93 kg)   SpO2 98%   BMI 29.41 kg/m  Physical Exam Vitals and nursing note reviewed.  Constitutional:      General: He is not in acute distress.    Appearance: Normal appearance. He is ill-appearing.  HENT:     Head: Normocephalic and atraumatic.  Pulmonary:     Effort: Pulmonary effort is normal.  Skin:    General: Skin is warm and dry.  Neurological:     Mental Status: He is alert and oriented to person, place, and time. Mental status is at baseline.  Psychiatric:        Mood and Affect: Mood normal.        Behavior: Behavior normal.        Thought Content: Thought content normal.        Judgment: Judgment normal.     Results:  Studies Obtained And Personally Reviewed By Me: Labs:     Component Value Date/Time   NA 141 08/04/2023 0739   K 4.4 08/04/2023 0739   CL 104 08/04/2023 0739   CO2 24 08/04/2023 0739   GLUCOSE 96 08/04/2023 0739   GLUCOSE 95 04/03/2023 0955   BUN 19 08/04/2023 0739   CREATININE 1.13 08/04/2023 0739   CREATININE 1.04 04/03/2023 0955   CALCIUM  9.8 08/04/2023 0739   PROT 7.4 04/03/2023 0955   ALBUMIN 4.2 12/24/2021 0755   AST 20 04/03/2023 0955   ALT 33 04/03/2023 0955   ALKPHOS 59 12/24/2021 0755   BILITOT 0.7 04/03/2023 0955   GFRNONAA >60 12/24/2021 0755   GFRNONAA 72 09/04/2020 0924   GFRAA 83 09/04/2020 0924    Lab Results  Component Value Date   WBC 5.4 04/03/2023   HGB 16.4 04/03/2023   HCT 48.4 04/03/2023   MCV 86.6 04/03/2023   PLT 272 04/03/2023   Lab Results  Component Value Date   CHOL 101 07/11/2023   HDL 38 (L) 07/11/2023   LDLCALC 40 07/11/2023   TRIG 144 07/11/2023   CHOLHDL 2.7 07/11/2023   Lab Results  Component Value Date   HGBA1C 5.4 05/31/2019    Lab Results  Component Value Date   TSH 1.62 04/03/2023    Assessment & Plan:   Meds ordered this encounter  Medications   doxycycline  (VIBRA -TABS) 100 MG tablet    Sig: Take 1 tablet (100  mg total) by mouth 2 (two) times daily.    Dispense:  20 tablet    Refill:  0   HYDROcodone  bit-homatropine (HYCODAN) 5-1.5 MG/5ML syrup    Sig: Take 5 mLs by mouth every 8 (eight) hours as needed for cough.    Dispense:  120 mL    Refill:  0   Covid-19: tested positive for Covid-19 last night and has had symptoms of:  sore throat, non-productive cough, fever/chills, congestion, body aches, sneezing, pressure behind eyes, and rhinorrhea. Tuesday, he was seen at his Dermatologist reportedly, and has had contact with coworkers, but has not recently traveled. Sending in 100 mg Doxycycline  - take 1 tablet (100 mg total) by mouth 2 (two) times daily and Hycodan syrup for cough - take 1 teaspoon every 8 hours as needed. Stay well rested, well hydrated, and well nourished. Walk around some to prevent atelectasis. Contact us  if symptoms worsen/persist despite treatment. Note provided for work.   I,Emily Lagle,acting as a Neurosurgeon for Ronal JINNY Hailstone, MD.,have documented all relevant documentation on the behalf of Ronal JINNY Hailstone, MD,as directed by  Ronal JINNY Hailstone, MD while in the presence of Ronal JINNY Hailstone, MD.   I, Ronal JINNY Hailstone, MD, have reviewed all documentation for this visit. The documentation on 06/25/24 for the exam, diagnosis, procedures, and orders are all accurate and complete.

## 2024-06-25 ENCOUNTER — Encounter: Payer: Self-pay | Admitting: Internal Medicine

## 2024-06-25 NOTE — Patient Instructions (Addendum)
 Options discussed including Paxlovid  or antibiotics. We decide to try Doxycycline  100 mg twice daily for 10 days and Hycodan one teaspoon every 8 hours as needed. Rest and stay well hydrated. Needs to quarantine for 5 days. Note provided for work.

## 2024-07-15 ENCOUNTER — Other Ambulatory Visit: Payer: Self-pay | Admitting: Internal Medicine

## 2024-07-22 ENCOUNTER — Other Ambulatory Visit: Payer: Self-pay | Admitting: *Deleted

## 2024-07-22 MED ORDER — DILTIAZEM HCL ER 240 MG PO CP24: 240.0000 mg | ORAL_CAPSULE | Freq: Every day | ORAL | 0 refills | Status: DC

## 2024-07-25 ENCOUNTER — Other Ambulatory Visit: Payer: Self-pay | Admitting: Physician Assistant

## 2024-07-25 DIAGNOSIS — I10 Essential (primary) hypertension: Secondary | ICD-10-CM

## 2024-07-25 DIAGNOSIS — E782 Mixed hyperlipidemia: Secondary | ICD-10-CM

## 2024-07-25 DIAGNOSIS — Z79899 Other long term (current) drug therapy: Secondary | ICD-10-CM

## 2024-07-25 MED ORDER — SPIRONOLACTONE 25 MG PO TABS: 25.0000 mg | ORAL_TABLET | Freq: Every day | ORAL | 0 refills | Status: DC

## 2024-08-27 ENCOUNTER — Other Ambulatory Visit: Payer: Self-pay | Admitting: Physician Assistant

## 2024-08-27 MED ORDER — DOXAZOSIN MESYLATE 2 MG PO TABS: 2.0000 mg | ORAL_TABLET | Freq: Every day | ORAL | 0 refills | Status: DC

## 2024-09-16 ENCOUNTER — Ambulatory Visit (INDEPENDENT_AMBULATORY_CARE_PROVIDER_SITE_OTHER)

## 2024-09-16 VITALS — BP 130/80 | HR 78 | Ht 71.5 in | Wt 211.0 lb

## 2024-09-16 DIAGNOSIS — Z23 Encounter for immunization: Secondary | ICD-10-CM

## 2024-09-16 NOTE — Progress Notes (Signed)
 Patient presents to the office for a flu vaccine. Patient tolerated well. Left deltoid. AV, CMA

## 2024-09-29 ENCOUNTER — Other Ambulatory Visit: Payer: Self-pay | Admitting: Physician Assistant

## 2024-10-09 ENCOUNTER — Telehealth: Payer: Self-pay | Admitting: Cardiovascular Disease

## 2024-10-09 ENCOUNTER — Encounter: Payer: Self-pay | Admitting: Internal Medicine

## 2024-10-09 NOTE — Telephone Encounter (Signed)
*  STAT* If patient is at the pharmacy, call can be transferred to refill team.   1. Which medications need to be refilled? (please list name of each medication and dose if known)   doxazosin  (CARDURA ) 2 MG tablet    2. Which pharmacy/location (including street and city if local pharmacy) is medication to be sent to?  [facility] [place], [place] - [address]    3. Do they need a 30 day or 90 day supply? 90

## 2024-10-10 ENCOUNTER — Ambulatory Visit: Attending: Physician Assistant | Admitting: Physician Assistant

## 2024-10-10 ENCOUNTER — Encounter: Payer: Self-pay | Admitting: Physician Assistant

## 2024-10-10 VITALS — BP 110/74 | HR 73 | Ht 71.5 in | Wt 205.6 lb

## 2024-10-10 DIAGNOSIS — R011 Cardiac murmur, unspecified: Secondary | ICD-10-CM | POA: Diagnosis not present

## 2024-10-10 DIAGNOSIS — I10 Essential (primary) hypertension: Secondary | ICD-10-CM | POA: Diagnosis not present

## 2024-10-10 DIAGNOSIS — R42 Dizziness and giddiness: Secondary | ICD-10-CM

## 2024-10-10 DIAGNOSIS — E782 Mixed hyperlipidemia: Secondary | ICD-10-CM

## 2024-10-10 DIAGNOSIS — Z79899 Other long term (current) drug therapy: Secondary | ICD-10-CM | POA: Diagnosis not present

## 2024-10-10 MED ORDER — DILTIAZEM HCL ER 180 MG PO CP24: 180.0000 mg | ORAL_CAPSULE | Freq: Every day | ORAL | 0 refills | Status: DC

## 2024-10-10 NOTE — Progress Notes (Signed)
 Cardiology Office Note   Date:  10/10/2024  ID:  Ricky Sharp, Ricky Sharp 1961-08-09, MRN [id number] PCP: Perri Ronal PARAS, MD  [facility] Providers Cardiologist:  Aleene Passe, MD (Inactive)   History of Present Illness Ricky Sharp is a 63 y.o. male with a hx of hypertension who presents for cardiovascular follow-up and medication management.  His heart condition remains stable, with increased exercise positively impacting blood pressure. Recent inactivity due to travel has affected weight management, with a recorded weight of 195 pounds in July. He experiences occasional dizziness and is uncertain about the necessity and dosage of his current medications. He takes diltiazem  240 mg, which may be causing skin inflammation as noted by his dermatologist. He uses a [employer] app to track blood pressure and weight and has a blood pressure cuff at home for regular monitoring. He has not had recent lab work and is due for a workup in January. His last lipid panel was in the summer of last year, with a good LDL level, and he is on Crestor  for cholesterol management. No issues with fluid retention in his ankles or feet. He recalls a CT scan two years ago for a calcium  score of 422 and is unsure about past echocardiograms. His thyroid was checked recently with a comprehensive panel, and he believes it is time for another check.  Reports no shortness of breath nor dyspnea on exertion. Reports no chest pain, pressure, or tightness. No edema, orthopnea, PND. Reports no palpitations.   Discussed the use of AI scribe software for clinical note transcription with the patient, who gave verbal consent to proceed.   ROS: pertinent ROS in HPI  Studies Reviewed EKG Interpretation Date/Time:  Thursday October 10 2024 08:40:29 EDT Ventricular Rate:  70 PR Interval:  220 QRS Duration:  88 QT Interval:  404 QTC Calculation: 436 R Axis:   6  Text Interpretation: Sinus rhythm with 1st degree A-V block  When compared with ECG of 28-Jul-2023 16:23, No significant change was found Confirmed by Lucien Blanc [phone]) on 10/10/2024 8:52:11 AM   CT cardiac scoring 12/29/21  Cardiovascular Disease Risk stratification   EXAM: Coronary Calcium  Score   TECHNIQUE: A gated, non-contrast computed tomography scan of the heart was performed using 3mm slice thickness. Axial images were analyzed on a dedicated workstation. Calcium  scoring of the coronary arteries was performed using the Agatston method.   FINDINGS: Coronary arteries: Normal origins.   Coronary Calcium  Score:   Left main: 0   Left anterior descending artery: 406   Left circumflex artery: 0   Right coronary artery: 16.2   Total: 422   Percentile: 97   Pericardium: Normal.   Ascending Aorta: Normal caliber.   Non-cardiac: See separate report from [facility].   IMPRESSION: Coronary calcium  score of 422. This was 23 percentile for age-, race-, and sex-matched controls.   RECOMMENDATIONS: Coronary artery calcium  (CAC) score is a strong predictor of incident coronary heart disease (CHD) and provides predictive information beyond traditional risk factors. CAC scoring is reasonable to use in the decision to withhold, postpone, or initiate statin therapy in intermediate-risk or selected borderline-risk asymptomatic adults (age 47-75 years and LDL-C >=70 to <190 mg/dL) who do not have diabetes or established atherosclerotic cardiovascular disease (ASCVD).* In intermediate-risk (10-year ASCVD risk >=7.5% to <20%) adults or selected borderline-risk (10-year ASCVD risk >=5% to <7.5%) adults in whom a CAC score is measured for the purpose of making a treatment decision the following recommendations have been made:  If CAC=0, it is reasonable to withhold statin therapy and reassess in 5 to 10 years, as long as higher risk conditions are absent (diabetes mellitus, family history of premature CHD in first  degree relatives (males <55 years; females <65 years), cigarette smoking, or LDL >=190 mg/dL).   If CAC is 1 to 99, it is reasonable to initiate statin therapy for patients >=52 years of age.   If CAC is >=100 or >=75th percentile, it is reasonable to initiate statin therapy at any age.   Cardiology referral should be considered for patients with CAC scores >=400 or >=75th percentile.   *2018 AHA/ACC/AACVPR/AAPA/ABC/ACPM/ADA/AGS/APhA/ASPC/NLA/PCNA Guideline on the Management of Blood Cholesterol: A Report of the American College of Cardiology/American Heart Association Task Force on Clinical Practice Guidelines. J Am Coll Cardiol. 2019;73(24):3168-3209.   Kardie Tobb, DO [facility]   Electronically Signed: By: Kardie  Tobb D.O. On: 12/29/2021 17:11  Physical Exam VS:  BP 110/74   Pulse 73   Ht 5' 11.5 (1.816 m)   Wt 205 lb 9.6 oz (93.3 kg)   SpO2 97%   BMI 28.28 kg/m        Wt Readings from Last 3 Encounters:  10/10/24 205 lb 9.6 oz (93.3 kg)  09/16/24 211 lb (95.7 kg)  06/21/24 205 lb (93 kg)    GEN: Well nourished, well developed in no acute distress NECK: No JVD; No carotid bruits CARDIAC: RRR, + systolic murmurs, rubs, gallops RESPIRATORY:  Clear to auscultation without rales, wheezing or rhonchi  ABDOMEN: Soft, non-tender, non-distended EXTREMITIES:  No edema; No deformity   ASSESSMENT AND PLAN  Cardiac murmur, unspecified Quiet systolic murmur suggests mild valvular issue, not urgent. - Order echocardiogram, timing dependent on insurance.  Essential hypertension Blood pressure low, possibly due to diltiazem  dosage causing skin inflammation. - Decrease diltiazem  to 180 mg once daily. - Monitor blood pressure for two weeks, one hour post-medication.  Dizziness Dizziness possibly related to low blood pressure or thyroid disorder. - Monitor blood pressure for two weeks, one hour post-medication. - Advise on maintaining adequate hydration.  Hyperlipidemia,  unspecified Managed with Crestor . Lipid panel due, last conducted last summer. LDL previously good. - Order lipid panel.  Disorder of thyroid, unspecified Recent thyroid function check. Dizziness may suggest overmedication or low blood pressure. - Order TSH test.      Dispo: He can follow-up in 12 months.   Signed, Orren LOISE Fabry, PA-C

## 2024-10-10 NOTE — Patient Instructions (Signed)
 Medication Instructions:  DECREASE Diltiazem  to 180mg  daily *If you need a refill on your cardiac medications before your next appointment, please call your pharmacy*  Lab Work: TODAY TSH, BMP, LIPIDs If you have labs (blood work) drawn today and your tests are completely normal, you will receive your results only by: MyChart Message (if you have MyChart) OR A paper copy in the mail If you have any lab test that is abnormal or we need to change your treatment, we will call you to review the results.  Testing/Procedures:  Your physician has requested that you have an echocardiogram. Echocardiography is a painless test that uses sound waves to create images of your heart. It provides your doctor with information about the size and shape of your heart and how well your heart's chambers and valves are working. This procedure takes approximately one hour. There are no restrictions for this procedure. Please do NOT wear cologne, perfume, aftershave, or lotions (deodorant is allowed). Please arrive 15 minutes prior to your appointment time.  Please note: We ask at that you not bring children with you during ultrasound (echo/ vascular) testing. Due to room size and safety concerns, children are not allowed in the ultrasound rooms during exams. Our front office staff cannot provide observation of children in our lobby area while testing is being conducted. An adult accompanying a patient to their appointment will only be allowed in the ultrasound room at the discretion of the ultrasound technician under special circumstances. We apologize for any inconvenience.   Follow-Up: At [facility], you and your health needs are our priority.  As part of our continuing mission to provide you with exceptional heart care, our providers are all part of one team.  This team includes your primary Cardiologist (physician) and Advanced Practice Providers or APPs (Physician Assistants and Nurse Practitioners)  who all work together to provide you with the care you need, when you need it.  Your next appointment:   12 month(s)  Provider:    Emeline Calender, MD  We recommend signing up for the patient portal called MyChart.  Sign up information is provided on this After Visit Summary.  MyChart is used to connect with patients for Virtual Visits (Telemedicine).  Patients are able to view lab/test results, encounter notes, upcoming appointments, etc.  Non-urgent messages can be sent to your provider as well.   To learn more about what you can do with MyChart, go to [email or web address].   Other Instructions None

## 2024-10-11 LAB — COMPREHENSIVE METABOLIC PANEL WITH GFR
ALT: 49 IU/L — ABNORMAL HIGH (ref 0–44)
AST: 31 IU/L (ref 0–40)
Albumin: 4.9 g/dL (ref 3.9–4.9)
Alkaline Phosphatase: 70 IU/L (ref 47–123)
BUN/Creatinine Ratio: 18 (ref 10–24)
BUN: 19 mg/dL (ref 8–27)
Bilirubin Total: 0.9 mg/dL (ref 0.0–1.2)
CO2: 21 mmol/L (ref 20–29)
Calcium: 9.7 mg/dL (ref 8.6–10.2)
Chloride: 101 mmol/L (ref 96–106)
Creatinine, Ser: 1.05 mg/dL (ref 0.76–1.27)
Globulin, Total: 2.3 g/dL (ref 1.5–4.5)
Glucose: 103 mg/dL — ABNORMAL HIGH (ref 70–99)
Potassium: 4.3 mmol/L (ref 3.5–5.2)
Sodium: 139 mmol/L (ref 134–144)
Total Protein: 7.2 g/dL (ref 6.0–8.5)
eGFR: 79 mL/min/1.73 (ref >59)

## 2024-10-11 LAB — LIPID PANEL
Chol/HDL Ratio: 4 ratio (ref 0.0–5.0)
Cholesterol, Total: 124 mg/dL (ref 100–199)
HDL: 31 mg/dL — ABNORMAL LOW (ref >39)
LDL Chol Calc (NIH): 60 mg/dL (ref 0–99)
Triglycerides: 201 mg/dL — ABNORMAL HIGH (ref 0–149)
VLDL Cholesterol Cal: 33 mg/dL (ref 5–40)

## 2024-10-11 LAB — TSH: TSH: 1.86 u[IU]/mL (ref 0.450–4.500)

## 2024-10-13 ENCOUNTER — Other Ambulatory Visit: Payer: Self-pay | Admitting: Physician Assistant

## 2024-10-14 ENCOUNTER — Ambulatory Visit: Payer: Self-pay | Admitting: Physician Assistant

## 2024-10-21 ENCOUNTER — Ambulatory Visit (HOSPITAL_COMMUNITY)
Admission: RE | Admit: 2024-10-21 | Discharge: 2024-10-21 | Disposition: A | Discharge status: Home / Self Care | Source: Ambulatory Visit | Attending: Physician Assistant | Admitting: Physician Assistant

## 2024-10-21 DIAGNOSIS — R011 Cardiac murmur, unspecified: Secondary | ICD-10-CM | POA: Diagnosis present

## 2024-10-21 DIAGNOSIS — I10 Essential (primary) hypertension: Secondary | ICD-10-CM | POA: Insufficient documentation

## 2024-10-21 LAB — ECHOCARDIOGRAM COMPLETE
Area-P 1/2: 3.99 cm2
Radius: 0.3 cm
S' Lateral: 3.2 cm

## 2024-11-03 ENCOUNTER — Other Ambulatory Visit: Payer: Self-pay | Admitting: Internal Medicine

## 2024-11-12 ENCOUNTER — Other Ambulatory Visit: Payer: Self-pay | Admitting: Physician Assistant

## 2024-11-12 DIAGNOSIS — E782 Mixed hyperlipidemia: Secondary | ICD-10-CM

## 2024-11-12 DIAGNOSIS — Z79899 Other long term (current) drug therapy: Secondary | ICD-10-CM

## 2024-11-12 DIAGNOSIS — I10 Essential (primary) hypertension: Secondary | ICD-10-CM

## 2025-01-07 ENCOUNTER — Other Ambulatory Visit: Payer: Self-pay | Admitting: Physician Assistant

## 2025-01-12 ENCOUNTER — Other Ambulatory Visit: Payer: Self-pay | Admitting: Internal Medicine
